# Patient Record
Sex: Male | Born: 1960 | Race: White | Hispanic: No | Marital: Married | Smoking: Former smoker
Health system: Southern US, Community
[De-identification: ages and names within clinical notes are randomized; demographics above are authoritative.]

## PROBLEM LIST (undated history)

## (undated) DIAGNOSIS — K635 Polyp of colon: Secondary | ICD-10-CM

## (undated) DIAGNOSIS — R931 Abnormal findings on diagnostic imaging of heart and coronary circulation: Secondary | ICD-10-CM

## (undated) DIAGNOSIS — T7840XA Allergy, unspecified, initial encounter: Secondary | ICD-10-CM

## (undated) DIAGNOSIS — I4891 Unspecified atrial fibrillation: Secondary | ICD-10-CM

## (undated) DIAGNOSIS — T148XXA Other injury of unspecified body region, initial encounter: Secondary | ICD-10-CM

## (undated) DIAGNOSIS — E119 Type 2 diabetes mellitus without complications: Secondary | ICD-10-CM

## (undated) DIAGNOSIS — E785 Hyperlipidemia, unspecified: Secondary | ICD-10-CM

## (undated) DIAGNOSIS — G709 Myoneural disorder, unspecified: Secondary | ICD-10-CM

## (undated) DIAGNOSIS — I10 Essential (primary) hypertension: Secondary | ICD-10-CM

## (undated) DIAGNOSIS — K648 Other hemorrhoids: Secondary | ICD-10-CM

## (undated) DIAGNOSIS — M109 Gout, unspecified: Secondary | ICD-10-CM

## (undated) DIAGNOSIS — K219 Gastro-esophageal reflux disease without esophagitis: Secondary | ICD-10-CM

## (undated) HISTORY — PX: BACK SURGERY: SHX140

## (undated) HISTORY — PX: POLYPECTOMY: SHX149

## (undated) HISTORY — PX: EXTERNAL EAR SURGERY: SHX627

## (undated) HISTORY — DX: Other injury of unspecified body region, initial encounter: T14.8XXA

## (undated) HISTORY — PX: LUMBAR DISC SURGERY: SHX700

## (undated) HISTORY — PX: KNEE ARTHROSCOPY: SUR90

## (undated) HISTORY — DX: Allergy, unspecified, initial encounter: T78.40XA

## (undated) HISTORY — DX: Essential (primary) hypertension: I10

## (undated) HISTORY — DX: Gout, unspecified: M10.9

## (undated) HISTORY — DX: Myoneural disorder, unspecified: G70.9

## (undated) HISTORY — DX: Gastro-esophageal reflux disease without esophagitis: K21.9

## (undated) HISTORY — DX: Unspecified atrial fibrillation: I48.91

## (undated) HISTORY — DX: Polyp of colon: K63.5

## (undated) HISTORY — DX: Type 2 diabetes mellitus without complications: E11.9

## (undated) HISTORY — DX: Abnormal findings on diagnostic imaging of heart and coronary circulation: R93.1

## (undated) HISTORY — DX: Other hemorrhoids: K64.8

## (undated) HISTORY — PX: COLONOSCOPY: SHX174

## (undated) HISTORY — DX: Hyperlipidemia, unspecified: E78.5

## (undated) HISTORY — PX: CARPAL TUNNEL RELEASE: SHX101

---
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
## 2000-01-08 ENCOUNTER — Encounter: Admission: RE | Admit: 2000-01-08 | Discharge: 2000-02-11 | Payer: Self-pay | Admitting: *Deleted

## 2002-06-27 ENCOUNTER — Emergency Department (HOSPITAL_COMMUNITY): Admission: EM | Admit: 2002-06-27 | Discharge: 2002-06-27 | Payer: Self-pay | Admitting: Emergency Medicine

## 2002-06-27 ENCOUNTER — Encounter: Payer: Self-pay | Admitting: Emergency Medicine

## 2003-12-07 ENCOUNTER — Ambulatory Visit (HOSPITAL_COMMUNITY): Admission: RE | Admit: 2003-12-07 | Discharge: 2003-12-07 | Payer: Self-pay | Admitting: Unknown Physician Specialty

## 2004-08-04 ENCOUNTER — Emergency Department (HOSPITAL_COMMUNITY): Admission: EM | Admit: 2004-08-04 | Discharge: 2004-08-04 | Payer: Self-pay | Admitting: Family Medicine

## 2006-10-27 ENCOUNTER — Ambulatory Visit (HOSPITAL_COMMUNITY): Admission: RE | Admit: 2006-10-27 | Discharge: 2006-10-27 | Payer: Self-pay | Admitting: Orthopedic Surgery

## 2007-08-19 HISTORY — PX: OTHER SURGICAL HISTORY: SHX169

## 2007-09-20 ENCOUNTER — Encounter: Admission: RE | Admit: 2007-09-20 | Discharge: 2007-09-20 | Payer: Self-pay | Admitting: Orthopaedic Surgery

## 2008-08-29 ENCOUNTER — Ambulatory Visit: Payer: Self-pay | Admitting: Cardiovascular Disease

## 2008-09-06 ENCOUNTER — Ambulatory Visit: Payer: Self-pay

## 2008-09-06 ENCOUNTER — Encounter: Payer: Self-pay | Admitting: Cardiovascular Disease

## 2010-04-12 ENCOUNTER — Emergency Department (HOSPITAL_COMMUNITY): Admission: EM | Admit: 2010-04-12 | Discharge: 2010-04-12 | Payer: Self-pay | Admitting: Emergency Medicine

## 2010-12-31 NOTE — Assessment & Plan Note (Signed)
[facility]                            CARDIOLOGY OFFICE NOTE   Donald, Butler                           MRN:          [id number]  DATE:08/29/2008                            DOB:          1960/10/24    Donald Butler is an unfortunate 50 year old gentleman who has been  incapacitated over the last year and a half by an injury at work.  He  has a dead left leg.  He has had multiple surgeries including three in  the last year by the chief of surgery at [facility].  He seems extremely  depressed about this.  He has been having some atypical chest pain.  The  pain has been in the center of his chest.  It is not always exertional  and it radiates up towards his shoulders and down his arm.  He has some  paresthesias in both hands and wrists.  This is associated occasionally  with exertional dyspnea.  He is not a smoker.  He quit in 1989.  He does  not have chronic lung disease.  Unfortunately in the setting of his leg  injury and multiple surgeries, he has gained about 35 pounds.  He has  had some lower extremity edema.  His blood pressure has been labile.  His primary care doctor has been adjusting these in regards to his  Benicar.   He has not had a good diet.  He eats too many carbohydrates which is  added to his weight gain.   The patient is fearful about his heart and wants to make sure that it is  okay and to trying to get on with his life.  Unfortunately, his overall  activity level is limited by a lame left leg.   He has not had any previous cardiac workup before.   REVIEW OF SYSTEMS:  Otherwise negative.   PAST MEDICAL HISTORY:  Remarkable for previous back surgery and multiple  left knee surgeries.   He currently has been unemployed for a year and a half.  I believe he is  getting disability through __________ [employer] where his injury  occurred.  He is married with two teenage children.  He does not smoke  or drink.  He is definitely  depressed.   REVIEW OF SYSTEMS:  Remarkable for fatigue, reflux, anxiety, depression,  hiatal hernia, and seasonal allergies.   FAMILY HISTORY:  Remarkable for premature coronary artery disease in  both mother and father.   CURRENT MEDICATIONS:  1. Benicar 40 a day.  2. Prilosec p.r.n.  3. Aspirin a day.   PHYSICAL EXAMINATION:  GENERAL:  Remarkable for an overweight male in no  distress.  VITAL SIGNS:  His blood pressure is 130/80, pulse 76 and regular,  respiratory rate 14, afebrile, weight 250.  HEENT:  Unremarkable.  NECK:  Carotids normal without bruit.  No lymphadenopathy, thyromegaly,  or JVP elevation.  LUNGS:  Clear.  Good diaphragmatic motion.  No wheezing.  CARDIAC:  S1, S2 with normal heart sounds.  PMI normal.  ABDOMEN:  Benign.  Bowel sounds positive.  No  AAA.  No tenderness.  No  bruit.  No hepatosplenomegaly or hepatojugular reflux.  No tenderness.  EXTREMITIES:  Distal pulses are intact.  No edema.  NEUROLOGIC:  Remarkable for decreased strength in the left leg with  multiple previous surgeries.   EKG is normal.   IMPRESSION:  1. Atypical chest pain.  Coronary risk factors include hypertension      and family history.  Followup adenosine Myoview.  2. Dyspnea with chest pain.  The patient will have a 2D echocardiogram      to assess right ventricular and left ventricular function.  I      suspect this is functional.  3. Significant depression.  Followup with primary care MD.  We will      start the patient on SSRI.  4. Hypertension, labile.  Continue Benicar.  Consider addition of a      low-dose diuretic.  5. History of reflux.  Continue Prilosec.   As long as the patient's cardiac tests are normal, he will be seen on an  as-needed basis.     Noralyn Pick. Eden Emms, MD, [facility]  Electronically Signed    PCN/MedQ  DD: 08/29/2008  DT: 08/30/2008  Job #: [id number]   cc:   Samuel Jester

## 2011-05-23 ENCOUNTER — Other Ambulatory Visit (HOSPITAL_COMMUNITY): Payer: Self-pay | Admitting: Otolaryngology

## 2011-05-23 DIAGNOSIS — H9211 Otorrhea, right ear: Secondary | ICD-10-CM

## 2011-05-27 ENCOUNTER — Ambulatory Visit (HOSPITAL_COMMUNITY)
Admission: RE | Admit: 2011-05-27 | Discharge: 2011-05-27 | Disposition: A | Discharge status: Home / Self Care | Payer: Medicare Other | Source: Ambulatory Visit | Attending: Otolaryngology | Admitting: Otolaryngology

## 2011-05-27 DIAGNOSIS — H9211 Otorrhea, right ear: Secondary | ICD-10-CM

## 2011-05-27 DIAGNOSIS — H7010 Chronic mastoiditis, unspecified ear: Secondary | ICD-10-CM | POA: Insufficient documentation

## 2011-05-27 DIAGNOSIS — H6690 Otitis media, unspecified, unspecified ear: Secondary | ICD-10-CM | POA: Insufficient documentation

## 2011-05-27 DIAGNOSIS — H902 Conductive hearing loss, unspecified: Secondary | ICD-10-CM | POA: Insufficient documentation

## 2011-08-19 HISTORY — PX: MASTOIDECTOMY: SHX711

## 2012-03-30 ENCOUNTER — Encounter: Payer: Self-pay | Admitting: Gastroenterology

## 2012-04-22 ENCOUNTER — Ambulatory Visit (AMBULATORY_SURGERY_CENTER): Payer: Medicare Other | Admitting: *Deleted

## 2012-04-22 VITALS — Ht 68.0 in | Wt 238.8 lb

## 2012-04-22 DIAGNOSIS — Z1211 Encounter for screening for malignant neoplasm of colon: Secondary | ICD-10-CM

## 2012-04-22 MED ORDER — MOVIPREP 100 G PO SOLR: ORAL | Status: DC

## 2012-05-06 ENCOUNTER — Encounter: Payer: Self-pay | Admitting: Gastroenterology

## 2012-05-06 ENCOUNTER — Ambulatory Visit (AMBULATORY_SURGERY_CENTER): Payer: Medicare Other | Admitting: Gastroenterology

## 2012-05-06 VITALS — BP 138/94 | HR 70 | Temp 98.0°F | Resp 16 | Ht 68.0 in | Wt 238.0 lb

## 2012-05-06 DIAGNOSIS — D126 Benign neoplasm of colon, unspecified: Secondary | ICD-10-CM

## 2012-05-06 DIAGNOSIS — Z1211 Encounter for screening for malignant neoplasm of colon: Secondary | ICD-10-CM

## 2012-05-06 MED ORDER — SODIUM CHLORIDE 0.9 % IV SOLN: 500.0000 mL | INTRAVENOUS | Status: DC

## 2012-05-06 NOTE — Progress Notes (Signed)
Patient did not experience any of the following events: a burn prior to discharge; a fall within the facility; wrong site/side/patient/procedure/implant event; or a hospital transfer or hospital admission upon discharge from the facility. (G8907) Patient did not have preoperative order for IV antibiotic SSI prophylaxis. (G8918)  

## 2012-05-06 NOTE — Op Note (Signed)
[facility] [address] [place], [address]   COLONOSCOPY PROCEDURE REPORT PATIENT: Donald Butler, Donald Butler  MR#: [id number] BIRTHDATE: 1961-07-08 , 51  yrs. old GENDER: Male ENDOSCOPIST: Meryl Dare, MD, [facility] REFERRED NF:AOZHY Yetta Barre, PA-C PROCEDURE DATE:  05/06/2012 PROCEDURE:   Colonoscopy with snare polypectomy ASA CLASS:   Class II INDICATIONS:average risk screening. MEDICATIONS: MAC sedation, administered by CRNA and propofol (Diprivan) 300mg  IV DESCRIPTION OF PROCEDURE:   After the risks benefits and alternatives of the procedure were thoroughly explained, informed consent was obtained.  A digital rectal exam revealed no abnormalities of the rectum.   The LB CF-[id number]  endoscope was introduced through the anus and advanced to the cecum, which was identified by both the appendix and ileocecal valve. No adverse events experienced.   The quality of the prep was adequate, using MoviPrep  The instrument was then slowly withdrawn as the colon was fully examined.   COLON FINDINGS: A sessile polyp measuring 7 mm in size was found in the transverse colon.  A polypectomy was performed with a cold snare.  The resection was complete and the polyp tissue was completely retrieved.   A sessile polyp measuring 5 mm in size was found in the sigmoid colon.  A polypectomy was performed with a cold snare.  The resection was complete and the polyp tissue was completely retrieved.   Moderate diverticulosis was noted in the sigmoid colon and descending colon.   The colon was otherwise normal.  There was no diverticulosis, inflammation, polyps or cancers unless previously stated.  Retroflexed views revealed moderate internal hemorrhoids. The time to cecum=0 minutes 48 seconds.  Withdrawal time=9 minutes 20 seconds.  The scope was withdrawn and the procedure completed. COMPLICATIONS: There were no complications.  ENDOSCOPIC IMPRESSION: 1.   Sessile polyp in the  transverse colon; polypectomy was performed with a cold snare 2.   Sessile polyp in the sigmoid colon; polypectomy was performed with a cold snare 3.   Moderate diverticulosis in the sigmoid colon and descending colon 4.   Moderate internal hemorrhoids  RECOMMENDATIONS: 1.  Await pathology results 2.  High fiber diet with liberal fluid intake. 3.  Repeat colonoscopy in 5 years if polyp(s) adenomatous; otherwise 10 years  eSigned:  Meryl Dare, MD, [facility] 05/06/2012 11:49 AM

## 2012-05-06 NOTE — Progress Notes (Signed)
No complaints noted in the recovery room. Maw   

## 2012-05-06 NOTE — Patient Instructions (Addendum)
Discharge instructions given with verbal understanding. Handouts on polyps,diverticulosis and hemorrhoids given. Resume previous medications. YOU HAD AN ENDOSCOPIC PROCEDURE TODAY AT THE [facility]: Refer to the procedure report that was given to you for any specific questions about what was found during the examination.  If the procedure report does not answer your questions, please call your gastroenterologist to clarify.  If you requested that your care partner not be given the details of your procedure findings, then the procedure report has been included in a sealed envelope for you to review at your convenience later.  YOU SHOULD EXPECT: Some feelings of bloating in the abdomen. Passage of more gas than usual.  Walking can help get rid of the air that was put into your GI tract during the procedure and reduce the bloating. If you had a lower endoscopy (such as a colonoscopy or flexible sigmoidoscopy) you may notice spotting of blood in your stool or on the toilet paper. If you underwent a bowel prep for your procedure, then you may not have a normal bowel movement for a few days.  DIET: Your first meal following the procedure should be a light meal and then it is ok to progress to your normal diet.  A half-sandwich or bowl of soup is an example of a good first meal.  Heavy or fried foods are harder to digest and may make you feel nauseous or bloated.  Likewise meals heavy in dairy and vegetables can cause extra gas to form and this can also increase the bloating.  Drink plenty of fluids but you should avoid alcoholic beverages for 24 hours.  ACTIVITY: Your care partner should take you home directly after the procedure.  You should plan to take it easy, moving slowly for the rest of the day.  You can resume normal activity the day after the procedure however you should NOT DRIVE or use heavy machinery for 24 hours (because of the sedation medicines used during the test).    SYMPTOMS TO  REPORT IMMEDIATELY: A gastroenterologist can be reached at any hour.  During normal business hours, 8:30 AM to 5:00 PM Monday through Friday, call [phone].  After hours and on weekends, please call the GI answering service at [phone] who will take a message and have the physician on call contact you.   Following lower endoscopy (colonoscopy or flexible sigmoidoscopy):  Excessive amounts of blood in the stool  Significant tenderness or worsening of abdominal pains  Swelling of the abdomen that is new, acute  Fever of 100F or higher  FOLLOW UP: If any biopsies were taken you will be contacted by phone or by letter within the next 1-3 weeks.  Call your gastroenterologist if you have not heard about the biopsies in 3 weeks.  Our staff will call the home number listed on your records the next business day following your procedure to check on you and address any questions or concerns that you may have at that time regarding the information given to you following your procedure. This is a courtesy call and so if there is no answer at the home number and we have not heard from you through the emergency physician on call, we will assume that you have returned to your regular daily activities without incident.  SIGNATURES/CONFIDENTIALITY: You and/or your care partner have signed paperwork which will be entered into your electronic medical record.  These signatures attest to the fact that that the information above on your After Visit   Summary has been reviewed and is understood.  Full responsibility of the confidentiality of this discharge information lies with you and/or your care-partner.  

## 2012-05-07 ENCOUNTER — Telehealth: Payer: Self-pay | Admitting: *Deleted

## 2012-05-07 NOTE — Telephone Encounter (Signed)
  Follow up Call-  Call back number 05/06/2012  Post procedure Call Back phone  # [phone]  Permission to leave phone message Yes     Patient questions:  Do you have a fever, pain , or abdominal swelling? no Pain Score  0 *  Have you tolerated food without any problems? yes  Have you been able to return to your normal activities? yes  Do you have any questions about your discharge instructions: Diet   no Medications  no Follow up visit  no  Do you have questions or concerns about your Care? no  Actions: * If pain score is 4 or above: No action needed, pain <4.

## 2012-05-11 ENCOUNTER — Encounter: Payer: Self-pay | Admitting: Gastroenterology

## 2014-11-15 DIAGNOSIS — G5602 Carpal tunnel syndrome, left upper limb: Secondary | ICD-10-CM | POA: Diagnosis not present

## 2014-11-15 DIAGNOSIS — G5601 Carpal tunnel syndrome, right upper limb: Secondary | ICD-10-CM | POA: Diagnosis not present

## 2014-11-29 DIAGNOSIS — R202 Paresthesia of skin: Secondary | ICD-10-CM | POA: Diagnosis not present

## 2014-11-30 DIAGNOSIS — G5601 Carpal tunnel syndrome, right upper limb: Secondary | ICD-10-CM | POA: Diagnosis not present

## 2014-11-30 DIAGNOSIS — G5602 Carpal tunnel syndrome, left upper limb: Secondary | ICD-10-CM | POA: Diagnosis not present

## 2014-11-30 DIAGNOSIS — R202 Paresthesia of skin: Secondary | ICD-10-CM | POA: Diagnosis not present

## 2014-12-07 DIAGNOSIS — G5602 Carpal tunnel syndrome, left upper limb: Secondary | ICD-10-CM | POA: Diagnosis not present

## 2014-12-11 DIAGNOSIS — G5602 Carpal tunnel syndrome, left upper limb: Secondary | ICD-10-CM | POA: Diagnosis not present

## 2014-12-20 DIAGNOSIS — M25511 Pain in right shoulder: Secondary | ICD-10-CM | POA: Diagnosis not present

## 2015-01-05 DIAGNOSIS — Z79899 Other long term (current) drug therapy: Secondary | ICD-10-CM | POA: Diagnosis not present

## 2015-01-05 DIAGNOSIS — R0682 Tachypnea, not elsewhere classified: Secondary | ICD-10-CM | POA: Diagnosis not present

## 2015-01-05 DIAGNOSIS — R05 Cough: Secondary | ICD-10-CM | POA: Diagnosis not present

## 2015-01-05 DIAGNOSIS — R0602 Shortness of breath: Secondary | ICD-10-CM | POA: Diagnosis not present

## 2015-01-05 DIAGNOSIS — Z7982 Long term (current) use of aspirin: Secondary | ICD-10-CM | POA: Diagnosis not present

## 2015-01-05 DIAGNOSIS — Z87891 Personal history of nicotine dependence: Secondary | ICD-10-CM | POA: Diagnosis not present

## 2015-01-05 DIAGNOSIS — I10 Essential (primary) hypertension: Secondary | ICD-10-CM | POA: Diagnosis not present

## 2015-01-05 DIAGNOSIS — J069 Acute upper respiratory infection, unspecified: Secondary | ICD-10-CM | POA: Diagnosis not present

## 2015-01-05 DIAGNOSIS — R0689 Other abnormalities of breathing: Secondary | ICD-10-CM | POA: Diagnosis not present

## 2015-01-10 DIAGNOSIS — G5600 Carpal tunnel syndrome, unspecified upper limb: Secondary | ICD-10-CM | POA: Diagnosis not present

## 2015-04-30 DIAGNOSIS — E785 Hyperlipidemia, unspecified: Secondary | ICD-10-CM | POA: Diagnosis not present

## 2015-04-30 DIAGNOSIS — R5383 Other fatigue: Secondary | ICD-10-CM | POA: Diagnosis not present

## 2015-04-30 DIAGNOSIS — Z0000 Encounter for general adult medical examination without abnormal findings: Secondary | ICD-10-CM | POA: Diagnosis not present

## 2015-04-30 DIAGNOSIS — G629 Polyneuropathy, unspecified: Secondary | ICD-10-CM | POA: Diagnosis not present

## 2015-04-30 DIAGNOSIS — G5732 Lesion of lateral popliteal nerve, left lower limb: Secondary | ICD-10-CM | POA: Diagnosis not present

## 2015-04-30 DIAGNOSIS — Z125 Encounter for screening for malignant neoplasm of prostate: Secondary | ICD-10-CM | POA: Diagnosis not present

## 2015-04-30 DIAGNOSIS — L570 Actinic keratosis: Secondary | ICD-10-CM | POA: Diagnosis not present

## 2015-05-10 DIAGNOSIS — L570 Actinic keratosis: Secondary | ICD-10-CM | POA: Diagnosis not present

## 2015-05-10 DIAGNOSIS — D0421 Carcinoma in situ of skin of right ear and external auricular canal: Secondary | ICD-10-CM | POA: Diagnosis not present

## 2015-05-10 DIAGNOSIS — X32XXXA Exposure to sunlight, initial encounter: Secondary | ICD-10-CM | POA: Diagnosis not present

## 2015-07-23 DIAGNOSIS — L570 Actinic keratosis: Secondary | ICD-10-CM | POA: Diagnosis not present

## 2015-07-23 DIAGNOSIS — D0462 Carcinoma in situ of skin of left upper limb, including shoulder: Secondary | ICD-10-CM | POA: Diagnosis not present

## 2015-07-23 DIAGNOSIS — Z85828 Personal history of other malignant neoplasm of skin: Secondary | ICD-10-CM | POA: Diagnosis not present

## 2015-07-23 DIAGNOSIS — X32XXXD Exposure to sunlight, subsequent encounter: Secondary | ICD-10-CM | POA: Diagnosis not present

## 2015-07-23 DIAGNOSIS — Z08 Encounter for follow-up examination after completed treatment for malignant neoplasm: Secondary | ICD-10-CM | POA: Diagnosis not present

## 2016-05-26 ENCOUNTER — Telehealth: Payer: Self-pay

## 2016-05-26 NOTE — Telephone Encounter (Signed)
He wants to know if he can pay cash to see Donald Butler. Please call patient.

## 2016-05-27 ENCOUNTER — Ambulatory Visit: Payer: Self-pay | Admitting: Physician Assistant

## 2016-05-29 ENCOUNTER — Encounter: Payer: Self-pay | Admitting: Family Medicine

## 2016-05-29 ENCOUNTER — Ambulatory Visit (INDEPENDENT_AMBULATORY_CARE_PROVIDER_SITE_OTHER): Payer: Medicare Other | Admitting: Family Medicine

## 2016-05-29 DIAGNOSIS — M109 Gout, unspecified: Secondary | ICD-10-CM | POA: Diagnosis not present

## 2016-05-29 DIAGNOSIS — K219 Gastro-esophageal reflux disease without esophagitis: Secondary | ICD-10-CM | POA: Diagnosis not present

## 2016-05-29 DIAGNOSIS — G5702 Lesion of sciatic nerve, left lower limb: Secondary | ICD-10-CM | POA: Diagnosis not present

## 2016-05-29 DIAGNOSIS — I10 Essential (primary) hypertension: Secondary | ICD-10-CM | POA: Diagnosis not present

## 2016-05-29 DIAGNOSIS — E785 Hyperlipidemia, unspecified: Secondary | ICD-10-CM | POA: Diagnosis not present

## 2016-05-29 DIAGNOSIS — E1169 Type 2 diabetes mellitus with other specified complication: Secondary | ICD-10-CM | POA: Insufficient documentation

## 2016-05-29 MED ORDER — HYDROCODONE-ACETAMINOPHEN 10-325 MG PO TABS: 1.0000 | ORAL_TABLET | Freq: Three times a day (TID) | ORAL | 0 refills | Status: DC | PRN

## 2016-05-29 NOTE — Progress Notes (Signed)
   HPI  Patient presents today to establish care.  Patient's previous PCP has moved to our clinic.  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 He gets by most days without any pain medications but would like a refill hydrocodone if possible today.  He uses 10 mg once a day as needed for severe pain. He states that on most days he does not need it. It's been at least 6 months since his last refill.   GERD- helped by Prilosec  Hypertension Treated with  Lisinopril denies any chest pain, dyspnea palpitations, or leg edema..  hyperlipidemia Previously told that he had high cholesterol, states that he does not even in the same shape he was at that time. Would like blood work today, has had orange juice today but otherwise fasting   PMH: Smoking status noted Medical history of GERD, gout, hypertension Past surgeries including back surgery 2, carpal tunnel release, shoulder reconstruction, L leg surgery (multiple)  Roughly 3 drinks a day of alcohol, no drug use, does not smoke but does chew tobacco ROS: Per HPI  Objective: BP (!) 152/90   Pulse 72   Temp 97.7 F (36.5 C) (Oral)   Ht _0  (1.727 m)   Wt 225 lb 6.4 oz (102.2 kg)   BMI 34.27 kg/m  Gen: NAD, alert, cooperative with exam HEENT: NCAT CV: RRR, good S1/S2, no murmur Resp: CTABL, no wheezes, non-labored Abdomen: + bowel sounds Ext: No edema, warm Neuro: Alert and oriented, decreased strength in LLE 3/5 compared to 5/5 in RLE  Assessment and plan:  # chronic neuralgia, 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 Lipid panel  . CMP14+EGFR  . CBC with Differential  . TSH  . Uric acid    Meds ordered this encounter  Medications  . HYDROcodone-acetaminophen (NORCO) 10-325 MG tablet    Sig: Take 1 tablet by mouth every 8 (eight) hours as needed.    Dispense:  30 tablet    Refill:  0    Donald Apple, MD [facility] 05/29/2016, 2:11 PM

## 2016-05-29 NOTE — Patient Instructions (Signed)
Great to meet you!  lets have you come back to see Glenard Haring in 6 weeks for repeat labs (assuming you will need a statin)

## 2016-05-30 LAB — CMP14+EGFR
ALBUMIN: 4.8 g/dL (ref 3.5–5.5)
ALK PHOS: 62 IU/L (ref 39–117)
ALT: 20 IU/L (ref 0–44)
AST: 23 IU/L (ref 0–40)
Albumin/Globulin Ratio: 2.1 (ref 1.2–2.2)
BUN/Creatinine Ratio: 26 — ABNORMAL HIGH (ref 9–20)
BUN: 22 mg/dL (ref 6–24)
Bilirubin Total: 0.5 mg/dL (ref 0.0–1.2)
CALCIUM: 9.6 mg/dL (ref 8.7–10.2)
CO2: 26 mmol/L (ref 18–29)
CREATININE: 0.85 mg/dL (ref 0.76–1.27)
Chloride: 97 mmol/L (ref 96–106)
GFR calc Af Amer: 113 mL/min/{1.73_m2} (ref >59)
GFR, EST NON AFRICAN AMERICAN: 97 mL/min/{1.73_m2} (ref >59)
GLOBULIN, TOTAL: 2.3 g/dL (ref 1.5–4.5)
GLUCOSE: 99 mg/dL (ref 65–99)
Potassium: 4.8 mmol/L (ref 3.5–5.2)
SODIUM: 137 mmol/L (ref 134–144)
Total Protein: 7.1 g/dL (ref 6.0–8.5)

## 2016-05-30 LAB — LIPID PANEL
CHOLESTEROL TOTAL: 223 mg/dL — AB (ref 100–199)
Chol/HDL Ratio: 3.8 ratio units (ref 0.0–5.0)
HDL: 59 mg/dL (ref >39)
LDL CALC: 139 mg/dL — AB (ref 0–99)
Triglycerides: 127 mg/dL (ref 0–149)
VLDL Cholesterol Cal: 25 mg/dL (ref 5–40)

## 2016-05-30 LAB — CBC WITH DIFFERENTIAL/PLATELET
Basophils Absolute: 0 10*3/uL (ref 0.0–0.2)
Basos: 1 %
EOS (ABSOLUTE): 0.2 10*3/uL (ref 0.0–0.4)
EOS: 3 %
HEMATOCRIT: 44.4 % (ref 37.5–51.0)
HEMOGLOBIN: 15.3 g/dL (ref 12.6–17.7)
IMMATURE GRANS (ABS): 0 10*3/uL (ref 0.0–0.1)
IMMATURE GRANULOCYTES: 0 %
LYMPHS ABS: 2.1 10*3/uL (ref 0.7–3.1)
Lymphs: 31 %
MCH: 33 pg (ref 26.6–33.0)
MCHC: 34.5 g/dL (ref 31.5–35.7)
MCV: 96 fL (ref 79–97)
MONOCYTES: 10 %
Monocytes Absolute: 0.7 10*3/uL (ref 0.1–0.9)
Neutrophils Absolute: 3.7 10*3/uL (ref 1.4–7.0)
Neutrophils: 55 %
Platelets: 228 10*3/uL (ref 150–379)
RBC: 4.64 x10E6/uL (ref 4.14–5.80)
RDW: 13.1 % (ref 12.3–15.4)
WBC: 6.8 10*3/uL (ref 3.4–10.8)

## 2016-05-30 LAB — TSH: TSH: 1.84 u[IU]/mL (ref 0.450–4.500)

## 2016-05-31 ENCOUNTER — Other Ambulatory Visit: Payer: Self-pay | Admitting: Family Medicine

## 2016-05-31 DIAGNOSIS — E785 Hyperlipidemia, unspecified: Secondary | ICD-10-CM

## 2016-05-31 MED ORDER — ATORVASTATIN CALCIUM 20 MG PO TABS: 20.0000 mg | ORAL_TABLET | Freq: Every day | ORAL | 3 refills | Status: DC

## 2016-06-10 ENCOUNTER — Other Ambulatory Visit: Payer: Self-pay | Admitting: Family Medicine

## 2016-06-10 MED ORDER — LOVASTATIN 20 MG PO TABS: 20.0000 mg | ORAL_TABLET | Freq: Every day | ORAL | 3 refills | Status: DC

## 2016-06-10 NOTE — Telephone Encounter (Signed)
Changed to lovastatin per request for 4$ list med.   Laroy Apple, MD [facility] 06/10/2016, 7:02 PM

## 2016-06-11 ENCOUNTER — Telehealth: Payer: Self-pay | Admitting: Family Medicine

## 2016-06-11 NOTE — Telephone Encounter (Signed)
Called in,pt aware 

## 2016-06-11 NOTE — Telephone Encounter (Signed)
Pt aware of change in medication 

## 2016-07-30 ENCOUNTER — Telehealth: Payer: Self-pay

## 2016-07-30 MED ORDER — LISINOPRIL 40 MG PO TABS: 40.0000 mg | ORAL_TABLET | Freq: Every day | ORAL | 0 refills | Status: DC

## 2016-07-30 NOTE — Addendum Note (Signed)
Addended by: Nigel Berthold C on: 07/30/2016 04:45 PM   Modules accepted: Orders

## 2016-07-30 NOTE — Telephone Encounter (Signed)
yes

## 2016-07-30 NOTE — Telephone Encounter (Signed)
[facility] requested refill on patients lisinopril 40mg . Is this okay to fill? Please advise and send back to pools.

## 2016-07-30 NOTE — Telephone Encounter (Signed)
Rx sent 

## 2016-11-01 DIAGNOSIS — H1013 Acute atopic conjunctivitis, bilateral: Secondary | ICD-10-CM | POA: Diagnosis not present

## 2016-11-01 DIAGNOSIS — H40033 Anatomical narrow angle, bilateral: Secondary | ICD-10-CM | POA: Diagnosis not present

## 2016-11-10 ENCOUNTER — Other Ambulatory Visit: Payer: Self-pay | Admitting: Physician Assistant

## 2016-12-19 ENCOUNTER — Other Ambulatory Visit: Payer: Self-pay | Admitting: Family Medicine

## 2017-01-27 ENCOUNTER — Other Ambulatory Visit: Payer: Self-pay | Admitting: Family Medicine

## 2017-01-27 ENCOUNTER — Telehealth: Payer: Self-pay | Admitting: Family Medicine

## 2017-01-27 NOTE — Telephone Encounter (Signed)
Contacted patient to let him know that he would need to be seen for continuous refills on medication.  Patient was very rude and stated he did not know why he would need to come in every month to have a refill by Particia Nearing, PA.  Informed patient that he seen Dr. Wendi Snipes in October of 2017 and would need to see Particia Nearing, PA at this office for follow up and refills.  Patient was continuously rude.  Informed patient that I would send a message over to Particia Nearing, PA for continuous refills on BP medication.

## 2017-01-28 MED ORDER — LISINOPRIL 40 MG PO TABS: 40.0000 mg | ORAL_TABLET | Freq: Every day | ORAL | 3 refills | Status: DC

## 2017-01-28 NOTE — Addendum Note (Signed)
Addended by: Terald Sleeper on: 01/28/2017 02:21 PM   Modules accepted: Orders

## 2017-02-03 ENCOUNTER — Other Ambulatory Visit: Payer: Self-pay | Admitting: Physician Assistant

## 2017-02-03 MED ORDER — AZITHROMYCIN 250 MG PO TABS: ORAL_TABLET | ORAL | 0 refills | Status: DC

## 2017-02-03 MED ORDER — HYDROCODONE-HOMATROPINE 5-1.5 MG/5ML PO SYRP: 5.0000 mL | ORAL_SOLUTION | Freq: Four times a day (QID) | ORAL | 0 refills | Status: DC | PRN

## 2017-03-03 ENCOUNTER — Ambulatory Visit (INDEPENDENT_AMBULATORY_CARE_PROVIDER_SITE_OTHER): Payer: Medicare Other | Admitting: Family Medicine

## 2017-03-03 ENCOUNTER — Encounter: Payer: Self-pay | Admitting: Family Medicine

## 2017-03-03 VITALS — BP 139/85 | HR 74 | Temp 97.5°F | Ht 68.0 in | Wt 238.0 lb

## 2017-03-03 DIAGNOSIS — N410 Acute prostatitis: Secondary | ICD-10-CM

## 2017-03-03 DIAGNOSIS — R1032 Left lower quadrant pain: Secondary | ICD-10-CM | POA: Diagnosis not present

## 2017-03-03 MED ORDER — CIPROFLOXACIN HCL 500 MG PO TABS: 500.0000 mg | ORAL_TABLET | Freq: Two times a day (BID) | ORAL | 0 refills | Status: DC

## 2017-03-03 MED ORDER — METRONIDAZOLE 500 MG PO TABS: 500.0000 mg | ORAL_TABLET | Freq: Two times a day (BID) | ORAL | 0 refills | Status: DC

## 2017-03-03 NOTE — Progress Notes (Signed)
Subjective:  Patient ID: Donald Butler, male    DOB: 11-15-1960  Age: 56 y.o. MRN: [id number]  CC: Abdominal Pain (pt here today c/o LLQ pain that comes and goes and over the weekend he had vomiting and diarrhea but that has subsided. He is due for his colonoscopy for 5 yer follow up in September of this year due to diverticulosis.)   HPI Donald Butler presents for 4 days of increasing left lower quadrant suprapubic pain. It started on the evening of 02/27/2017. He just felt bad and went to bed early. During that night he woke up with profuse vomiting. The next day started having profuse diarrhea as well. The vomiting stopped by the following day. However he has been having the diarrhea doesn't times or more daily. As of noon today he had just for loose watery bowel movements. He also has a pain in the left suprapubic area that's radiating down into the left testicle. It is intermittent and severe lasting just a few moments at a time.  Depression screen [facility] 2/9 03/03/2017 05/29/2016  Decreased Interest 0 0  Down, Depressed, Hopeless 0 0  PHQ - 2 Score 0 0    History Nolon has a past medical history of Allergy; GERD (gastroesophageal reflux disease); Gout; Hypertension; and Nerve damage.   He has a past surgical history that includes Back surgery (1982, 1983); Open anterior shoulder reconstruction (1970); Knee arthroscopy (2006, 2008); reconstruction left leg (2009); Mastoidectomy (2013); and Carpal tunnel release (Left).   His family history includes Diabetes in his father.He reports that he quit smoking about 28 years ago. His smokeless tobacco use includes Chew. He reports that he drinks about 10.5 oz of alcohol per week . He reports that he does not use drugs.    ROS Review of Systems  Constitutional: Negative for chills, diaphoresis, fever and unexpected weight change.  HENT: Negative for congestion, hearing loss, rhinorrhea and sore throat.   Eyes: Negative for visual  disturbance.  Respiratory: Negative for cough and shortness of breath.   Cardiovascular: Negative for chest pain.  Gastrointestinal: Positive for abdominal pain and diarrhea. Negative for constipation.  Genitourinary: Negative for dysuria and flank pain.  Musculoskeletal: Negative for arthralgias and joint swelling.  Skin: Negative for rash.  Neurological: Negative for dizziness and headaches.  Psychiatric/Behavioral: Negative for dysphoric mood and sleep disturbance.    Objective:  BP 139/85   Pulse 74   Temp (!) 97.5 F (36.4 C) (Oral)   Ht 5\' 8"  (1.727 m)   Wt 238 lb (108 kg)   BMI 36.19 kg/m   BP Readings from Last 3 Encounters:  03/03/17 139/85  05/29/16 (!) 152/90  05/06/12 (!) 138/94    Wt Readings from Last 3 Encounters:  03/03/17 238 lb (108 kg)  05/29/16 225 lb 6.4 oz (102.2 kg)  05/06/12 238 lb (108 kg)     Physical Exam  Constitutional: He is oriented to person, place, and time. He appears well-developed and well-nourished. No distress.  HENT:  Head: Normocephalic and atraumatic.  Right Ear: External ear normal.  Left Ear: External ear normal.  Nose: Nose normal.  Mouth/Throat: Oropharynx is clear and moist.  Eyes: Pupils are equal, round, and reactive to light. Conjunctivae and EOM are normal.  Neck: Normal range of motion. Neck supple. No thyromegaly present.  Cardiovascular: Normal rate, regular rhythm and normal heart sounds.   No murmur heard. Pulmonary/Chest: Effort normal and breath sounds normal. No respiratory distress. He has no wheezes.  He has no rales.  Abdominal: Soft. Bowel sounds are normal. He exhibits no distension. There is tenderness (LLQ).  Lymphadenopathy:    He has no cervical adenopathy.  Neurological: He is alert and oriented to person, place, and time. He has normal reflexes.  Skin: Skin is warm and dry.  Psychiatric: He has a normal mood and affect. His behavior is normal. Judgment and thought content normal.       Assessment & Plan:   Braddock was seen today for abdominal pain.  Diagnoses and all orders for this visit:  Acute prostatitis  Left lower quadrant pain  Other orders -     metroNIDAZOLE (FLAGYL) 500 MG tablet; Take 1 tablet (500 mg total) by mouth 2 (two) times daily. -     ciprofloxacin (CIPRO) 500 MG tablet; Take 1 tablet (500 mg total) by mouth 2 (two) times daily. For prostate. Take all of these.       I have discontinued Mr. Chong's azithromycin. I am also having him start on metroNIDAZOLE and ciprofloxacin. Additionally, I am having him maintain his multivitamin, aspirin, Cyanocobalamin (VITAMIN B-12 CR PO), omeprazole, loratadine, allopurinol, HYDROcodone-acetaminophen, lovastatin, lisinopril, and HYDROcodone-homatropine.  Allergies as of 03/03/2017      Reactions   Penicillins Hives, Shortness Of Breath, Swelling      Medication List       Accurate as of 03/03/17 11:59 PM. Always use your most recent med list.          allopurinol 300 MG tablet Commonly known as:  ZYLOPRIM Take 300 mg by mouth daily.   aspirin 325 MG tablet Take 325 mg by mouth daily.   ciprofloxacin 500 MG tablet Commonly known as:  CIPRO Take 1 tablet (500 mg total) by mouth 2 (two) times daily. For prostate. Take all of these.   HYDROcodone-acetaminophen 10-325 MG tablet Commonly known as:  NORCO Take 1 tablet by mouth every 8 (eight) hours as needed.   HYDROcodone-homatropine 5-1.5 MG/5ML syrup Commonly known as:  HYCODAN Take 5-10 mLs by mouth every 6 (six) hours as needed.   lisinopril 40 MG tablet Commonly known as:  PRINIVIL,ZESTRIL Take 1 tablet (40 mg total) by mouth daily.   loratadine 10 MG tablet Commonly known as:  CLARITIN Take 10 mg by mouth daily.   lovastatin 20 MG tablet Commonly known as:  MEVACOR Take 1 tablet (20 mg total) by mouth at bedtime.   metroNIDAZOLE 500 MG tablet Commonly known as:  FLAGYL Take 1 tablet (500 mg total) by mouth 2 (two)  times daily.   multivitamin tablet Take 1 tablet by mouth daily.   omeprazole 20 MG capsule Commonly known as:  PRILOSEC Take 20 mg by mouth daily.   VITAMIN B-12 CR PO Take 500 mg by mouth. Takes 2 tablets daily        Follow-up: Return in about 2 weeks (around 03/17/2017).  Claretta Fraise, M.D.

## 2017-03-25 DIAGNOSIS — F17290 Nicotine dependence, other tobacco product, uncomplicated: Secondary | ICD-10-CM | POA: Diagnosis not present

## 2017-03-25 DIAGNOSIS — H6983 Other specified disorders of Eustachian tube, bilateral: Secondary | ICD-10-CM | POA: Diagnosis not present

## 2017-03-25 DIAGNOSIS — Z9622 Myringotomy tube(s) status: Secondary | ICD-10-CM | POA: Diagnosis not present

## 2017-03-25 DIAGNOSIS — H6122 Impacted cerumen, left ear: Secondary | ICD-10-CM | POA: Diagnosis not present

## 2017-03-25 DIAGNOSIS — H9211 Otorrhea, right ear: Secondary | ICD-10-CM | POA: Diagnosis not present

## 2017-04-09 DIAGNOSIS — F17290 Nicotine dependence, other tobacco product, uncomplicated: Secondary | ICD-10-CM | POA: Diagnosis not present

## 2017-04-09 DIAGNOSIS — H6983 Other specified disorders of Eustachian tube, bilateral: Secondary | ICD-10-CM | POA: Diagnosis not present

## 2017-04-28 ENCOUNTER — Other Ambulatory Visit: Payer: Self-pay | Admitting: Physician Assistant

## 2017-05-27 ENCOUNTER — Telehealth: Payer: Self-pay | Admitting: Family Medicine

## 2017-05-27 DIAGNOSIS — Z1211 Encounter for screening for malignant neoplasm of colon: Secondary | ICD-10-CM

## 2017-05-27 NOTE — Telephone Encounter (Signed)
Please address

## 2017-05-27 NOTE — Telephone Encounter (Signed)
What type of referral do you need? Pt is due for a colonoscopy  Have you been seen at our office for this problem? no (If no, schedule them an appointment.  They will need to be seen before a referral can be done.)  Is there a particular doctor or location that you prefer? [facility], is where he had it last.  Patient notified that referrals can take up to a week or longer to process. If they haven't heard anything within a week they should call back and speak with the referral department.

## 2017-05-28 NOTE — Telephone Encounter (Signed)
Patient aware referral placed.

## 2017-06-02 ENCOUNTER — Encounter: Payer: Self-pay | Admitting: Gastroenterology

## 2017-06-09 ENCOUNTER — Encounter (INDEPENDENT_AMBULATORY_CARE_PROVIDER_SITE_OTHER): Payer: Self-pay

## 2017-06-09 ENCOUNTER — Ambulatory Visit (INDEPENDENT_AMBULATORY_CARE_PROVIDER_SITE_OTHER): Payer: Medicare Other | Admitting: Nurse Practitioner

## 2017-06-09 ENCOUNTER — Encounter: Payer: Self-pay | Admitting: Nurse Practitioner

## 2017-06-09 VITALS — BP 144/86 | HR 76 | Ht 68.0 in | Wt 240.1 lb

## 2017-06-09 DIAGNOSIS — R110 Nausea: Secondary | ICD-10-CM | POA: Diagnosis not present

## 2017-06-09 DIAGNOSIS — Z86010 Personal history of colonic polyps: Secondary | ICD-10-CM

## 2017-06-09 DIAGNOSIS — K625 Hemorrhage of anus and rectum: Secondary | ICD-10-CM

## 2017-06-09 DIAGNOSIS — K649 Unspecified hemorrhoids: Secondary | ICD-10-CM | POA: Diagnosis not present

## 2017-06-09 DIAGNOSIS — K219 Gastro-esophageal reflux disease without esophagitis: Secondary | ICD-10-CM | POA: Diagnosis not present

## 2017-06-09 DIAGNOSIS — R140 Abdominal distension (gaseous): Secondary | ICD-10-CM | POA: Diagnosis not present

## 2017-06-09 MED ORDER — HYDROCORTISONE 2.5 % RE CREA: 1.0000 "application " | TOPICAL_CREAM | Freq: Every day | RECTAL | 1 refills | Status: DC

## 2017-06-09 MED ORDER — NA SULFATE-K SULFATE-MG SULF 17.5-3.13-1.6 GM/177ML PO SOLN
ORAL | 0 refills | Status: DC
Start: 2017-06-09 — End: 2017-06-22

## 2017-06-09 NOTE — Patient Instructions (Addendum)
If you are age 57 or older, your body mass index should be between 23-30. Your Body mass index is 36.51 kg/m. If this is out of the aforementioned range listed, please consider follow up with your Primary Care Provider.  If you are age 44 or younger, your body mass index should be between 19-25. Your Body mass index is 36.51 kg/m. If this is out of the aformentioned range listed, please consider follow up with your Primary Care Provider.   You have been scheduled for a colonoscopy. Please follow written instructions given to you at your visit today.  Please pick up your prep supplies at the pharmacy within the next 1-3 days. If you use inhalers (even only as needed), please bring them with you on the day of your procedure. Your physician has requested that you go to www.startemmi.com and enter the access code given to you at your visit today. This web site gives a general overview about your procedure. However, you should still follow specific instructions given to you by our office regarding your preparation for the procedure.  We have sent the following medications to your pharmacy for you to pick up at your convenience: Anusol Cream Suprep   You have been given a Low Gast Diet.  Thank you for choosing me and [facility].   Tye Savoy, NP

## 2017-06-09 NOTE — Progress Notes (Signed)
HPI: Patient is a 56 yo known to Dr.Stark. He has a hx of adenomatous colon polyps and is due for surveillance colonoscopy. He is referred by PCP for multiple GI complaints including bloating, frequent nausea, loose stools, and hemorrhoidal bleeding. Saw PCP in July with LLQ pain, vomiting and diarrhea . Patient says he was given antibiotics for diverticulitis but I reviewed PCPs note and was treated for acute prostatitis. At any rate, the abdominal pain resolved with antibiotics. Patient denies diarrhea but he does require several bowel movements in the morning to feel fully evacuated.  He complains of bloating and frequent nausea.  His  appetite is decreased but has not lost any weight. .He has been having rectal bleeding attributed to hemorrhoids. Patient does not want a rectal exam today.Marland Kitchen He has not tried anything for his hemorrhoids.      Past Medical History:  Diagnosis Date  . Allergy   . GERD (gastroesophageal reflux disease)   . Gout   . Hypertension   . Nerve damage    left leg     Past Surgical History:  Procedure Laterality Date  . [place]   ruptured disk  x 2  . CARPAL TUNNEL RELEASE Left   . KNEE ARTHROSCOPY  2006, 2008   x 2  left  . MASTOIDECTOMY  2013   right  . OPEN ANTERIOR SHOULDER RECONSTRUCTION  1970   right  . reconstruction left leg  2009   multiple surgery after accident   Family History  Problem Relation Age of Onset  . Diabetes Father   . Colon cancer Neg Hx   . Stomach cancer Neg Hx   . Esophageal cancer Neg Hx   . Rectal cancer Neg Hx    Social History  Substance Use Topics  . Smoking status: Former Smoker    Quit date: 04/22/1988  . Smokeless tobacco: Current User    Types: Chew  . Alcohol use 10.5 oz/week    21 Standard drinks or equivalent per week   Current Outpatient Prescriptions  Medication Sig Dispense Refill  . allopurinol (ZYLOPRIM) 300 MG tablet Take 300 mg by mouth daily.    Marland Kitchen aspirin 325 MG tablet  Take 325 mg by mouth daily.    . Cyanocobalamin (VITAMIN B-12 PO) Take 5,000 mcg by mouth daily.    Marland Kitchen HYDROcodone-acetaminophen (NORCO) 10-325 MG tablet Take 1 tablet by mouth every 8 (eight) hours as needed. 30 tablet 0  . lisinopril (PRINIVIL,ZESTRIL) 40 MG tablet TAKE 1 TABLET BY MOUTH ONCE DAILY 90 tablet 0  . loratadine (CLARITIN) 10 MG tablet Take 10 mg by mouth daily.    Marland Kitchen lovastatin (MEVACOR) 20 MG tablet Take 1 tablet (20 mg total) by mouth at bedtime. 90 tablet 3  . Multiple Vitamin (MULTIVITAMIN) tablet Take 1 tablet by mouth daily.    Marland Kitchen omeprazole (PRILOSEC) 20 MG capsule Take 20 mg by mouth daily.     No current facility-administered medications for this visit.    Allergies  Allergen Reactions  . Penicillins Hives, Shortness Of Breath and Swelling     Review of Systems: All systems reviewed and negative except where noted in HPI.    Physical Exam: BP (!) 144/86   Pulse 76   Ht 5\' 8"  (1.727 m)   Wt 240 lb 2 oz (108.9 kg)   BMI 36.51 kg/m  Constitutional:  Overweight white male in no acute distress. Psychiatric: Normal mood and affect. Behavior is  normal. EENT: Pupils normal.  Conjunctivae are normal. No scleral icterus. Neck supple.  Cardiovascular: Normal rate, regular rhythm. No edema Pulmonary/chest: Effort normal and breath sounds normal. No wheezing, rales or rhonchi. Abdominal: Soft, obese, nontender. Bowel sounds active throughout. There are no masses palpable. No hepatomegaly. Lymphadenopathy: No cervical adenopathy noted. Neurological: Alert and oriented to person place and time. Skin: Skin is warm and dry. No rashes noted.   ASSESSMENT AND PLAN:  20. 56 year old male with history of adenomatous colon polyps, due for surveillance colonooscopy.  -Patient will be scheduled for a colonoscopy with possible polypectomy.  The risks and benefits of the procedure were discussed and the patient agrees to proceed.   2. GERD. Asymptomatic as long as he takes  Prilosec OTC every day  3. Bloating / belching / flatulence / nausea. No weight loss.  -Explained to patient that bloating and gas are almost always benign though still a nuisance.  -He can try Gas-X. He is eating yogurt which helps. Discussed gas producing foods to avoid. Low gas diet given  4. Rectal bleeding. PCP recently did DRE and found hemorrhoids. Patient does not want a repeat rectal exam today he is due for colonoscpy anyway so rectal exam was deferred -Anusol HC daily at bedtime internally and externally for 7 days  Tye Savoy, NP  06/09/2017, 8:50 AM  Cc: Timmothy Euler, MD

## 2017-06-15 NOTE — Progress Notes (Signed)
Reviewed and agree with management plan.  Toluwani Yadav T. Aleaha Fickling, MD FACG 

## 2017-06-22 ENCOUNTER — Encounter: Payer: Self-pay | Admitting: Gastroenterology

## 2017-06-22 ENCOUNTER — Ambulatory Visit (AMBULATORY_SURGERY_CENTER): Payer: Medicare Other | Admitting: Gastroenterology

## 2017-06-22 VITALS — BP 134/88 | HR 74 | Temp 97.8°F | Resp 13 | Ht 68.0 in | Wt 240.0 lb

## 2017-06-22 DIAGNOSIS — K635 Polyp of colon: Secondary | ICD-10-CM

## 2017-06-22 DIAGNOSIS — D123 Benign neoplasm of transverse colon: Secondary | ICD-10-CM

## 2017-06-22 DIAGNOSIS — Z86010 Personal history of colonic polyps: Secondary | ICD-10-CM

## 2017-06-22 DIAGNOSIS — K219 Gastro-esophageal reflux disease without esophagitis: Secondary | ICD-10-CM | POA: Diagnosis not present

## 2017-06-22 DIAGNOSIS — K625 Hemorrhage of anus and rectum: Secondary | ICD-10-CM | POA: Diagnosis not present

## 2017-06-22 DIAGNOSIS — D175 Benign lipomatous neoplasm of intra-abdominal organs: Secondary | ICD-10-CM | POA: Diagnosis not present

## 2017-06-22 DIAGNOSIS — I10 Essential (primary) hypertension: Secondary | ICD-10-CM | POA: Diagnosis not present

## 2017-06-22 DIAGNOSIS — D125 Benign neoplasm of sigmoid colon: Secondary | ICD-10-CM

## 2017-06-22 MED ORDER — SODIUM CHLORIDE 0.9 % IV SOLN: 500.0000 mL | INTRAVENOUS | Status: DC

## 2017-06-22 NOTE — Progress Notes (Signed)
Report given to PACU, vss 

## 2017-06-22 NOTE — Patient Instructions (Signed)
**   Handouts given on polyps, hemorrhoids, and diverticulosis. **   YOU HAD AN ENDOSCOPIC PROCEDURE TODAY AT THE [facility]:   Refer to the procedure report that was given to you for any specific questions about what was found during the examination.  If the procedure report does not answer your questions, please call your gastroenterologist to clarify.  If you requested that your care partner not be given the details of your procedure findings, then the procedure report has been included in a sealed envelope for you to review at your convenience later.  YOU SHOULD EXPECT: Some feelings of bloating in the abdomen. Passage of more gas than usual.  Walking can help get rid of the air that was put into your GI tract during the procedure and reduce the bloating. If you had a lower endoscopy (such as a colonoscopy or flexible sigmoidoscopy) you may notice spotting of blood in your stool or on the toilet paper. If you underwent a bowel prep for your procedure, you may not have a normal bowel movement for a few days.  Please Note:  You might notice some irritation and congestion in your nose or some drainage.  This is from the oxygen used during your procedure.  There is no need for concern and it should clear up in a day or so.  SYMPTOMS TO REPORT IMMEDIATELY:   Following lower endoscopy (colonoscopy or flexible sigmoidoscopy):  Excessive amounts of blood in the stool  Significant tenderness or worsening of abdominal pains  Swelling of the abdomen that is new, acute  Fever of 100F or higher   For urgent or emergent issues, a gastroenterologist can be reached at any hour by calling [phone].   DIET:  We do recommend a small meal at first, but then you may proceed to your regular diet.  Drink plenty of fluids but you should avoid alcoholic beverages for 24 hours.  ACTIVITY:  You should plan to take it easy for the rest of today and you should NOT DRIVE or use heavy machinery until  tomorrow (because of the sedation medicines used during the test).    FOLLOW UP: Our staff will call the number listed on your records the next business day following your procedure to check on you and address any questions or concerns that you may have regarding the information given to you following your procedure. If we do not reach you, we will leave a message.  However, if you are feeling well and you are not experiencing any problems, there is no need to return our call.  We will assume that you have returned to your regular daily activities without incident.  If any biopsies were taken you will be contacted by phone or by letter within the next 1-3 weeks.  Please call us at [phone] if you have not heard about the biopsies in 3 weeks.    SIGNATURES/CONFIDENTIALITY: You and/or your care partner have signed paperwork which will be entered into your electronic medical record.  These signatures attest to the fact that that the information above on your After Visit Summary has been reviewed and is understood.  Full responsibility of the confidentiality of this discharge information lies with you and/or your care-partner.

## 2017-06-22 NOTE — Progress Notes (Signed)
Called to room to assist during endoscopic procedure.  Patient ID and intended procedure confirmed with present staff. Received instructions for my participation in the procedure from the performing physician.  

## 2017-06-22 NOTE — Op Note (Signed)
Donald Butler Patient Name: Donald Butler Procedure Date: 06/22/2017 9:03 AM MRN: [id number] Endoscopist: Ladene Artist , MD Age: 56 Referring MD:  Date of Birth: June 26, 1961 Gender: Male Account #: [id number] Procedure:                Colonoscopy Indications:              Surveillance: Personal history of adenomatous                            polyps on last colonoscopy 5 years ago Medicines:                Monitored Anesthesia Care Procedure:                Pre-Anesthesia Assessment:                           - Prior to the procedure, a History and Physical                            was performed, and patient medications and                            allergies were reviewed. The patient's tolerance of                            previous anesthesia was also reviewed. The risks                            and benefits of the procedure and the sedation                            options and risks were discussed with the patient.                            All questions were answered, and informed consent                            was obtained. Prior Anticoagulants: The patient has                            taken no previous anticoagulant or antiplatelet                            agents. ASA Grade Assessment: III - A patient with                            severe systemic disease. After reviewing the risks                            and benefits, the patient was deemed in                            satisfactory condition to undergo the procedure.  After obtaining informed consent, the colonoscope                            was passed under direct vision. Throughout the                            procedure, the patient's blood pressure, pulse, and                            oxygen saturations were monitored continuously. The                            Model PCF-[id number]) scope was introduced                            through the anus and  advanced to the the cecum,                            identified by appendiceal orifice and ileocecal                            valve. The ileocecal valve, appendiceal orifice,                            and rectum were photographed. The quality of the                            bowel preparation was good. The colonoscopy was                            performed without difficulty. The patient tolerated                            the procedure well. Scope In: 9:18:16 AM Scope Out: 9:30:33 AM Scope Withdrawal Time: 0 hours 11 minutes 4 seconds  Total Procedure Duration: 0 hours 12 minutes 17 seconds  Findings:                 The perianal and digital rectal examinations were                            normal.                           The ileocecal valve was moderately lipomatous.                            Biopsies were taken with a cold forceps for                            histology.                           Three sessile polyps were found in the sigmoid  colon (2) and transverse colon (1). The polyps were                            6 to 7 mm in size. These polyps were removed with a                            cold snare. Resection and retrieval were complete.                           Multiple medium-mouthed diverticula were found in                            the left colon. There was narrowing of the colon in                            association with the diverticular opening. There                            was evidence of diverticular spasm. There was no                            evidence of diverticular bleeding.                           Internal hemorrhoids were found during                            retroflexion. The hemorrhoids were medium-sized and                            Grade II (internal hemorrhoids that prolapse but                            reduce spontaneously).                           The exam was otherwise without abnormality on                             direct and retroflexion views. Complications:            No immediate complications. Estimated blood loss:                            None. Estimated Blood Loss:     Estimated blood loss: none. Impression:               - Lipomatous IC valve.                           - Three 6 to 7 mm polyps in the sigmoid colon and                            in the transverse colon, removed with a cold snare.  Resected and retrieved.                           - Moderate diverticulosis in the left colon. There                            was narrowing of the colon in association with the                            diverticular opening. There was evidence of                            diverticular spasm. There was no evidence of                            diverticular bleeding.                           - Internal hemorrhoids.                           - The examination was otherwise normal on direct                            and retroflexion views. Recommendation:           - Repeat colonoscopy in 5 years for surveillance.                           - Patient has a contact number available for                            emergencies. The signs and symptoms of potential                            delayed complications were discussed with the                            patient. Return to normal activities tomorrow.                            Written discharge instructions were provided to the                            patient.                           - Continue present medications.                           - Await pathology results.                           - High fiber diet indefinitely.                           - Schedule hemorrhoidal banding. Pricilla Riffle  Fuller Plan, MD 06/22/2017 9:34:00 AM This report has been signed electronically.

## 2017-06-23 ENCOUNTER — Telehealth: Payer: Self-pay | Admitting: *Deleted

## 2017-06-23 NOTE — Telephone Encounter (Signed)
  Follow up Call-  Call back number 06/22/2017  Post procedure Call Back phone  # [phone]  Permission to leave phone message Yes  Some recent data might be hidden     Patient questions:  Do you have a fever, pain , or abdominal swelling? No. Pain Score  0 *  Have you tolerated food without any problems? Yes.    Have you been able to return to your normal activities? Yes.    Do you have any questions about your discharge instructions: Diet   Yes.   Medications  No. Follow up visit  No.  Do you have questions or concerns about your Care? No.  Actions: * If pain score is 4 or above: No action needed, pain <4.

## 2017-06-30 ENCOUNTER — Encounter: Payer: Self-pay | Admitting: Gastroenterology

## 2017-07-14 ENCOUNTER — Encounter: Payer: Medicare Other | Admitting: Internal Medicine

## 2017-07-20 ENCOUNTER — Encounter: Payer: Self-pay | Admitting: Physician Assistant

## 2017-07-20 ENCOUNTER — Ambulatory Visit (INDEPENDENT_AMBULATORY_CARE_PROVIDER_SITE_OTHER): Payer: Medicare Other | Admitting: Physician Assistant

## 2017-07-20 VITALS — BP 174/98 | HR 67 | Ht 68.0 in | Wt 244.0 lb

## 2017-07-20 DIAGNOSIS — I10 Essential (primary) hypertension: Secondary | ICD-10-CM

## 2017-07-20 DIAGNOSIS — G5702 Lesion of sciatic nerve, left lower limb: Secondary | ICD-10-CM | POA: Diagnosis not present

## 2017-07-20 DIAGNOSIS — E785 Hyperlipidemia, unspecified: Secondary | ICD-10-CM

## 2017-07-20 DIAGNOSIS — K219 Gastro-esophageal reflux disease without esophagitis: Secondary | ICD-10-CM

## 2017-07-20 MED ORDER — AMLODIPINE BESYLATE 5 MG PO TABS: 5.0000 mg | ORAL_TABLET | Freq: Every day | ORAL | 2 refills | Status: DC

## 2017-07-20 MED ORDER — AMLODIPINE BESYLATE 5 MG PO TABS: 10.0000 mg | ORAL_TABLET | Freq: Every day | ORAL | 2 refills | Status: DC

## 2017-07-20 MED ORDER — HYDROCODONE-ACETAMINOPHEN 10-325 MG PO TABS: 1.0000 | ORAL_TABLET | Freq: Three times a day (TID) | ORAL | 0 refills | Status: DC | PRN

## 2017-07-20 NOTE — Progress Notes (Signed)
BP (!) 174/98   Pulse 67   Ht 5\' 8"  (1.727 m)   Wt 244 lb (110.7 kg)   BMI 37.10 kg/m    Subjective:    Patient ID: Donald Butler, male    DOB: [birth year], 56 y.o.   MRN: [id number]  HPI: Donald Butler is a 56 y.o. 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  Overall he is about the same.  He has continued with disability.  He has been ordered Social Security disability.  He is doing well with his medications.  He hardly takes any pain medicine at all.  Last prescription was many months ago for #30 of hydrocodone.  We will print another prescription today.  Blood pressure is elevated today.  We are going to add amlodipine 5 mg 1 daily and recheck him in a month.  We have also discuss lowering sodium in his diet..   All medications are reviewed today. There are no reports of any problems with the medications. All of the medical conditions are reviewed and updated.  Lab work is reviewed and will be ordered as medically necessary. There are no new problems reported with today's visit.   Relevant past medical, surgical, family and social history reviewed and updated as indicated. Allergies and medications reviewed and updated.  Past Medical History:  Diagnosis Date  . Allergy   . GERD (gastroesophageal reflux disease)   . Gout   . Hyperlipidemia   . Hypertension   . Internal hemorrhoid   . Nerve damage    left leg  . 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 [place]   ruptured disk  x 2  . CARPAL TUNNEL RELEASE Left   . COLONOSCOPY    . EXTERNAL EAR SURGERY    . KNEE ARTHROSCOPY  2006, 2008   x 2  left  . MASTOIDECTOMY  2013   right  . OPEN ANTERIOR SHOULDER  RECONSTRUCTION  1970   right  . POLYPECTOMY    . reconstruction left leg  2009   multiple surgery after accident    Review of Systems  Constitutional: Negative.  Negative for appetite change and fatigue.  HENT: Negative.   Eyes: Negative.  Negative for pain and visual disturbance.  Respiratory: Negative.  Negative for cough, chest tightness, shortness of breath and wheezing.   Cardiovascular: Negative.  Negative for chest pain, palpitations and leg swelling.  Gastrointestinal: Negative.  Negative for abdominal pain, diarrhea, nausea and vomiting.  Endocrine: Negative.   Genitourinary: Negative.   Musculoskeletal: Positive for arthralgias and gait problem.  Skin: Negative.  Negative for color change and rash.  Neurological: Negative for weakness, numbness and headaches.  Psychiatric/Behavioral: Negative.     Allergies as of 07/20/2017      Reactions   Penicillins Hives, Shortness Of Breath, Swelling      Medication List        Accurate as of 07/20/17  9:37 AM. Always use your most recent med list.          allopurinol 300 MG tablet Commonly known as:  ZYLOPRIM Take 300 mg by mouth daily.   amLODipine 5 MG tablet Commonly known as:  NORVASC Take 1 tablet (5 mg  total) by mouth daily.   aspirin 325 MG tablet Take 325 mg by mouth daily.   HYDROcodone-acetaminophen 10-325 MG tablet Commonly known as:  NORCO Take 1 tablet by mouth every 8 (eight) hours as needed.   lisinopril 40 MG tablet Commonly known as:  PRINIVIL,ZESTRIL TAKE 1 TABLET BY MOUTH ONCE DAILY   loratadine 10 MG tablet Commonly known as:  CLARITIN Take 10 mg by mouth daily.   lovastatin 20 MG tablet Commonly known as:  MEVACOR Take 1 tablet (20 mg total) by mouth at bedtime.   multivitamin tablet Take 1 tablet by mouth daily.   omeprazole 20 MG capsule Commonly known as:  PRILOSEC Take 20 mg by mouth daily.   VITAMIN B-12 PO Take 5,000 mcg by mouth daily.          Objective:    BP  (!) 174/98   Pulse 67   Ht 5\' 8"  (1.727 m)   Wt 244 lb (110.7 kg)   BMI 37.10 kg/m   Allergies  Allergen Reactions  . Penicillins Hives, Shortness Of Breath and Swelling    Physical Exam  Constitutional: He appears well-developed and well-nourished.  HENT:  Head: Normocephalic and atraumatic.  Eyes: Conjunctivae and EOM are normal. Pupils are equal, round, and reactive to light.  Neck: Normal range of motion. Neck supple.  Cardiovascular: Normal rate, regular rhythm and normal heart sounds.  Pulmonary/Chest: Effort normal and breath sounds normal.  Abdominal: Soft. Bowel sounds are normal.  Musculoskeletal: Normal range of motion.  Skin: Skin is warm and dry.        Assessment & Plan:   1. Essential hypertension - amLODipine (NORVASC) 5 MG tablet; Take 1 tablet (5 mg total) by mouth daily.  Dispense: 30 tablet; Refill: 2  2. Gastroesophageal reflux disease, esophagitis presence not specified  3. Hyperlipidemia, unspecified hyperlipidemia type  4. Compression of common peroneal nerve, left - HYDROcodone-acetaminophen (NORCO) 10-325 MG tablet; Take 1 tablet by mouth every 8 (eight) hours as needed.  Dispense: 30 tablet; Refill: 0    Current Outpatient Medications:  .  aspirin 325 MG tablet, Take 325 mg by mouth daily., Disp: , Rfl:  .  Cyanocobalamin (VITAMIN B-12 PO), Take 5,000 mcg by mouth daily., Disp: , Rfl:  .  HYDROcodone-acetaminophen (NORCO) 10-325 MG tablet, Take 1 tablet by mouth every 8 (eight) hours as needed., Disp: 30 tablet, Rfl: 0 .  lisinopril (PRINIVIL,ZESTRIL) 40 MG tablet, TAKE 1 TABLET BY MOUTH ONCE DAILY, Disp: 90 tablet, Rfl: 0 .  loratadine (CLARITIN) 10 MG tablet, Take 10 mg by mouth daily., Disp: , Rfl:  .  lovastatin (MEVACOR) 20 MG tablet, Take 1 tablet (20 mg total) by mouth at bedtime., Disp: 90 tablet, Rfl: 3 .  Multiple Vitamin (MULTIVITAMIN) tablet, Take 1 tablet by mouth daily., Disp: , Rfl:  .  omeprazole (PRILOSEC) 20 MG capsule, Take  20 mg by mouth daily., Disp: , Rfl:  .  allopurinol (ZYLOPRIM) 300 MG tablet, Take 300 mg by mouth daily., Disp: , Rfl:  .  amLODipine (NORVASC) 5 MG tablet, Take 1 tablet (5 mg total) by mouth daily., Disp: 30 tablet, Rfl: 2 Continue all other maintenance medications as listed above.  Follow up plan: Return in about 4 weeks (around 08/17/2017) for recheck BP.  Educational handout given for [place] PA-C [place] [address]  [place], [place] [address] [phone]   07/20/2017, 9:37 AM

## 2017-07-20 NOTE — Patient Instructions (Addendum)
DASH Eating Plan DASH stands for "Dietary Approaches to Stop Hypertension." The DASH eating plan is a healthy eating plan that has been shown to reduce high blood pressure (hypertension). It may also reduce your risk for type 2 diabetes, heart disease, and stroke. The DASH eating plan may also help with weight loss. What are tips for following this plan? General guidelines  Avoid eating more than 2,300 mg (milligrams) of salt (sodium) a day. If you have hypertension, you may need to reduce your sodium intake to 1,500 mg a day.  Limit alcohol intake to no more than 1 drink a day for nonpregnant women and 2 drinks a day for men. One drink equals 12 oz of beer, 5 oz of wine, or 1 oz of hard liquor.  Work with your health care provider to maintain a healthy body weight or to lose weight. Ask what an ideal weight is for you.  Get at least 30 minutes of exercise that causes your heart to beat faster (aerobic exercise) most days of the week. Activities may include walking, swimming, or biking.  Work with your health care provider or diet and nutrition specialist (dietitian) to adjust your eating plan to your individual calorie needs. Reading food labels  Check food labels for the amount of sodium per serving. Choose foods with less than 5 percent of the Daily Value of sodium. Generally, foods with less than 300 mg of sodium per serving fit into this eating plan.  To find whole grains, look for the word "whole" as the first word in the ingredient list. Shopping  Buy products labeled as "low-sodium" or "no salt added."  Buy fresh foods. Avoid canned foods and premade or frozen meals. Cooking  Avoid adding salt when cooking. Use salt-free seasonings or herbs instead of table salt or sea salt. Check with your health care provider or pharmacist before using salt substitutes.  Do not fry foods. Cook foods using healthy methods such as baking, boiling, grilling, and broiling instead.  Cook with  heart-healthy oils, such as olive, canola, soybean, or sunflower oil. Meal planning   Eat a balanced diet that includes: ? 5 or more servings of fruits and vegetables each day. At each meal, try to fill half of your plate with fruits and vegetables. ? Up to 6-8 servings of whole grains each day. ? Less than 6 oz of lean meat, poultry, or fish each day. A 3-oz serving of meat is about the same size as a deck of cards. One egg equals 1 oz. ? 2 servings of low-fat dairy each day. ? A serving of nuts, seeds, or beans 5 times each week. ? Heart-healthy fats. Healthy fats called Omega-3 fatty acids are found in foods such as flaxseeds and coldwater fish, like sardines, salmon, and mackerel.  Limit how much you eat of the following: ? Canned or prepackaged foods. ? Food that is high in trans fat, such as fried foods. ? Food that is high in saturated fat, such as fatty meat. ? Sweets, desserts, sugary drinks, and other foods with added sugar. ? Full-fat dairy products.  Do not salt foods before eating.  Try to eat at least 2 vegetarian meals each week.  Eat more home-cooked food and less restaurant, buffet, and fast food.  When eating at a restaurant, ask that your food be prepared with less salt or no salt, if possible. What foods are recommended? The items listed may not be a complete list. Talk with your dietitian about what   dietary choices are best for you. Grains Whole-grain or whole-wheat bread. Whole-grain or whole-wheat pasta. Brown rice. Oatmeal. Quinoa. Bulgur. Whole-grain and low-sodium cereals. Pita bread. Low-fat, low-sodium crackers. Whole-wheat flour tortillas. Vegetables Fresh or frozen vegetables (raw, steamed, roasted, or grilled). Low-sodium or reduced-sodium tomato and vegetable juice. Low-sodium or reduced-sodium tomato sauce and tomato paste. Low-sodium or reduced-sodium canned vegetables. Fruits All fresh, dried, or frozen fruit. Canned fruit in natural juice (without  added sugar). Meat and other protein foods Skinless chicken or turkey. Ground chicken or turkey. Pork with fat trimmed off. Fish and seafood. Egg whites. Dried beans, peas, or lentils. Unsalted nuts, nut butters, and seeds. Unsalted canned beans. Lean cuts of beef with fat trimmed off. Low-sodium, lean deli meat. Dairy Low-fat (1%) or fat-free (skim) milk. Fat-free, low-fat, or reduced-fat cheeses. Nonfat, low-sodium ricotta or cottage cheese. Low-fat or nonfat yogurt. Low-fat, low-sodium cheese. Fats and oils Soft margarine without trans fats. Vegetable oil. Low-fat, reduced-fat, or light mayonnaise and salad dressings (reduced-sodium). Canola, safflower, olive, soybean, and sunflower oils. Avocado. Seasoning and other foods Herbs. Spices. Seasoning mixes without salt. Unsalted popcorn and pretzels. Fat-free sweets. What foods are not recommended? The items listed may not be a complete list. Talk with your dietitian about what dietary choices are best for you. Grains Baked goods made with fat, such as croissants, muffins, or some breads. Dry pasta or rice meal packs. Vegetables Creamed or fried vegetables. Vegetables in a cheese sauce. Regular canned vegetables (not low-sodium or reduced-sodium). Regular canned tomato sauce and paste (not low-sodium or reduced-sodium). Regular tomato and vegetable juice (not low-sodium or reduced-sodium). Pickles. Olives. Fruits Canned fruit in a light or heavy syrup. Fried fruit. Fruit in cream or butter sauce. Meat and other protein foods Fatty cuts of meat. Ribs. Fried meat. Bacon. Sausage. Bologna and other processed lunch meats. Salami. Fatback. Hotdogs. Bratwurst. Salted nuts and seeds. Canned beans with added salt. Canned or smoked fish. Whole eggs or egg yolks. Chicken or turkey with skin. Dairy Whole or 2% milk, cream, and half-and-half. Whole or full-fat cream cheese. Whole-fat or sweetened yogurt. Full-fat cheese. Nondairy creamers. Whipped toppings.  Processed cheese and cheese spreads. Fats and oils Butter. Stick margarine. Lard. Shortening. Ghee. Bacon fat. Tropical oils, such as coconut, palm kernel, or palm oil. Seasoning and other foods Salted popcorn and pretzels. Onion salt, garlic salt, seasoned salt, table salt, and sea salt. Worcestershire sauce. Tartar sauce. Barbecue sauce. Teriyaki sauce. Soy sauce, including reduced-sodium. Steak sauce. Canned and packaged gravies. Fish sauce. Oyster sauce. Cocktail sauce. Horseradish that you find on the shelf. Ketchup. Mustard. Meat flavorings and tenderizers. Bouillon cubes. Hot sauce and Tabasco sauce. Premade or packaged marinades. Premade or packaged taco seasonings. Relishes. Regular salad dressings. Where to find more information:  National Heart, Lung, and Blood Institute: www.nhlbi.nih.gov  American Heart Association: www.heart.org Summary  The DASH eating plan is a healthy eating plan that has been shown to reduce high blood pressure (hypertension). It may also reduce your risk for type 2 diabetes, heart disease, and stroke.  With the DASH eating plan, you should limit salt (sodium) intake to 2,300 mg a day. If you have hypertension, you may need to reduce your sodium intake to 1,500 mg a day.  When on the DASH eating plan, aim to eat more fresh fruits and vegetables, whole grains, lean proteins, low-fat dairy, and heart-healthy fats.  Work with your health care provider or diet and nutrition specialist (dietitian) to adjust your eating plan to your individual   calorie needs. This information is not intended to replace advice given to you by your health care provider. Make sure you discuss any questions you have with your health care provider. Document Released: 07/24/2011 Document Revised: 07/28/2016 Document Reviewed: 07/28/2016 Elsevier Interactive Patient Education  2017 Elsevier Inc.  

## 2017-08-03 ENCOUNTER — Other Ambulatory Visit: Payer: Self-pay | Admitting: Family Medicine

## 2017-08-20 ENCOUNTER — Encounter: Payer: Self-pay | Admitting: Physician Assistant

## 2017-08-20 ENCOUNTER — Ambulatory Visit (INDEPENDENT_AMBULATORY_CARE_PROVIDER_SITE_OTHER): Payer: Medicare Other | Admitting: Physician Assistant

## 2017-08-20 VITALS — BP 154/95 | HR 73 | Ht 68.0 in | Wt 241.0 lb

## 2017-08-20 DIAGNOSIS — I10 Essential (primary) hypertension: Secondary | ICD-10-CM | POA: Diagnosis not present

## 2017-08-20 MED ORDER — AMLODIPINE BESYLATE 5 MG PO TABS: 5.0000 mg | ORAL_TABLET | Freq: Every day | ORAL | 3 refills | Status: DC

## 2017-08-20 NOTE — Patient Instructions (Signed)
In a few days you may receive a survey in the mail or online from [employer] regarding your visit with us today. Please take a moment to fill this out. Your feedback is very important to our whole office. It can help us better understand your needs as well as improve your experience and satisfaction. Thank you for taking your time to complete it. We care about you.  Letisha Yera, PA-C  

## 2017-08-20 NOTE — Progress Notes (Signed)
BP (!) 154/95   Pulse 73   Ht 5\' 8"  (1.727 m)   Wt 241 lb (109.3 kg)   BMI 36.64 kg/m    Subjective:    Patient ID: Donald Butler, male    DOB: 1961-06-07, 57 y.o.   MRN: [id number]  HPI: Donald Butler is a 57 y.o. male presenting on 08/20/2017 for Follow-up (1 month recheck )  This patient comes in for 1 month recheck on his blood pressure.  He was started on amlodipine 5 mg 1 daily last month.  He states he is tolerating it very well.  He notes that his blood pressure has been decreasing.  He has the ability to check it at [facility].  He is down 3 pounds since his last visit and that is even over the holidays.  We have encouraged him to continue with his diet and exercise.  We will plan to recheck him in 6 months.  Relevant past medical, surgical, family and social history reviewed and updated as indicated. Allergies and medications reviewed and updated.  Past Medical History:  Diagnosis Date  . Allergy   . GERD (gastroesophageal reflux disease)   . Gout   . Hyperlipidemia   . Hypertension   . Internal hemorrhoid   . Nerve damage    left leg  . 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 [place]   ruptured disk  x 2  . CARPAL TUNNEL RELEASE Left   . COLONOSCOPY    . EXTERNAL EAR SURGERY    . KNEE ARTHROSCOPY  2006, 2008   x 2  left  . MASTOIDECTOMY  2013   right  . OPEN ANTERIOR SHOULDER RECONSTRUCTION  1970   right  . POLYPECTOMY    . reconstruction left leg  2009   multiple surgery after accident    Review of Systems  Constitutional: Negative.  Negative for appetite change and fatigue.  HENT: Negative.   Eyes: Negative.  Negative for pain and visual disturbance.  Respiratory: Negative.  Negative for cough, chest tightness, shortness of breath and wheezing.   Cardiovascular: Negative.  Negative for chest pain, palpitations and leg swelling.    Gastrointestinal: Negative.  Negative for abdominal pain, diarrhea, nausea and vomiting.  Endocrine: Negative.   Genitourinary: Negative.   Musculoskeletal: Negative.   Skin: Negative.  Negative for color change and rash.  Neurological: Negative.  Negative for weakness, numbness and headaches.  Psychiatric/Behavioral: Negative.     Allergies as of 08/20/2017      Reactions   Penicillins Hives, Shortness Of Breath, Swelling      Medication List        Accurate as of 08/20/17  1:42 PM. Always use your most recent med list.          amLODipine 5 MG tablet Commonly known as:  NORVASC Take 1 tablet (5 mg total) by mouth daily.   aspirin 325 MG tablet Take 325 mg by mouth daily.   HYDROcodone-acetaminophen 10-325 MG tablet Commonly known as:  NORCO Take 1 tablet by mouth every 8 (eight) hours as needed.   lisinopril 40 MG tablet Commonly known as:  PRINIVIL,ZESTRIL TAKE 1 TABLET BY MOUTH ONCE DAILY   loratadine 10 MG tablet Commonly known as:  CLARITIN Take 10 mg by mouth daily.   lovastatin 20 MG tablet Commonly known as:  MEVACOR Take 1  tablet (20 mg total) by mouth at bedtime.   multivitamin tablet Take 1 tablet by mouth daily.   omeprazole 20 MG capsule Commonly known as:  PRILOSEC Take 20 mg by mouth daily.   VITAMIN B-12 PO Take 5,000 mcg by mouth daily.          Objective:    BP (!) 154/95   Pulse 73   Ht 5\' 8"  (1.727 m)   Wt 241 lb (109.3 kg)   BMI 36.64 kg/m   Allergies  Allergen Reactions  . Penicillins Hives, Shortness Of Breath and Swelling    Physical Exam  Constitutional: He appears well-developed and well-nourished. No distress.  HENT:  Head: Normocephalic and atraumatic.  Eyes: Conjunctivae and EOM are normal. Pupils are equal, round, and reactive to light.  Cardiovascular: Normal rate, regular rhythm and normal heart sounds.  Pulmonary/Chest: Effort normal and breath sounds normal. No respiratory distress.  Skin: Skin is warm and  dry.  Psychiatric: He has a normal mood and affect. His behavior is normal.  Nursing note and vitals reviewed.       Assessment & Plan:   1. Essential hypertension - amLODipine (NORVASC) 5 MG tablet; Take 1 tablet (5 mg total) by mouth daily.  Dispense: 90 tablet; Refill: 3 Continue weight loss and increase exercise   Current Outpatient Medications:  .  amLODipine (NORVASC) 5 MG tablet, Take 1 tablet (5 mg total) by mouth daily., Disp: 90 tablet, Rfl: 3 .  aspirin 325 MG tablet, Take 325 mg by mouth daily., Disp: , Rfl:  .  Cyanocobalamin (VITAMIN B-12 PO), Take 5,000 mcg by mouth daily., Disp: , Rfl:  .  HYDROcodone-acetaminophen (NORCO) 10-325 MG tablet, Take 1 tablet by mouth every 8 (eight) hours as needed., Disp: 30 tablet, Rfl: 0 .  lisinopril (PRINIVIL,ZESTRIL) 40 MG tablet, TAKE 1 TABLET BY MOUTH ONCE DAILY, Disp: 90 tablet, Rfl: 1 .  loratadine (CLARITIN) 10 MG tablet, Take 10 mg by mouth daily., Disp: , Rfl:  .  lovastatin (MEVACOR) 20 MG tablet, Take 1 tablet (20 mg total) by mouth at bedtime., Disp: 90 tablet, Rfl: 3 .  Multiple Vitamin (MULTIVITAMIN) tablet, Take 1 tablet by mouth daily., Disp: , Rfl:  .  omeprazole (PRILOSEC) 20 MG capsule, Take 20 mg by mouth daily., Disp: , Rfl:  Continue all other maintenance medications as listed above.  Follow up plan: Return in about 3 months (around 11/18/2017) for well.  Educational handout given for [place] PA-C [place] [address]  [place], [place] [address] [phone]   08/20/2017, 1:42 PM

## 2017-08-26 ENCOUNTER — Other Ambulatory Visit: Payer: Self-pay | Admitting: Family Medicine

## 2017-08-27 NOTE — Telephone Encounter (Signed)
Last lipid 05/29/16

## 2017-12-01 ENCOUNTER — Telehealth: Payer: Self-pay | Admitting: Physician Assistant

## 2017-12-01 ENCOUNTER — Encounter: Payer: Self-pay | Admitting: Family Medicine

## 2017-12-01 ENCOUNTER — Ambulatory Visit (INDEPENDENT_AMBULATORY_CARE_PROVIDER_SITE_OTHER): Payer: Medicare Other | Admitting: Family Medicine

## 2017-12-01 VITALS — BP 145/89 | HR 79 | Temp 99.4°F | Ht 68.0 in | Wt 240.0 lb

## 2017-12-01 DIAGNOSIS — M10072 Idiopathic gout, left ankle and foot: Secondary | ICD-10-CM | POA: Diagnosis not present

## 2017-12-01 MED ORDER — ALLOPURINOL 300 MG PO TABS: 300.0000 mg | ORAL_TABLET | Freq: Every day | ORAL | 6 refills | Status: DC

## 2017-12-01 MED ORDER — COLCHICINE 0.6 MG PO TABS: 0.6000 mg | ORAL_TABLET | Freq: Two times a day (BID) | ORAL | 0 refills | Status: DC

## 2017-12-01 NOTE — Telephone Encounter (Signed)
Allopurinol sent to the pharmacy for the patient that his request

## 2017-12-01 NOTE — Progress Notes (Addendum)
Subjective:  Patient ID: Donald Butler, male    DOB: 1960/11/03  Age: 57 y.o. MRN: [id number]  CC: Gout (pt here today c/o gout in left big toe and needs refill on allopurinol)   HPI [name] presents for Patient presents today for follow-up of gout.  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  He then developed gout in the right lower extremity.  Subsequently he went on allopurinol for several years.  Eventually the left lower extremity neuropathy cleared.  Since the gout was thought to be caused by overcompensation of the right leg, he thought that perhaps the gout would not come back when he no longer had to overuse the right leg.  Therefore he quit using the allopurinol about a year ago.  Until today he had not had any further gout.  This morning about 3 AM, he awoke with excruciating pain in the left great toe.  He presents insisting on starting back on his allopurinol due to 10/10 pain.  It is an excruciating throbbing located at the left first MTP region.  He is unable to bear weight on the toe.  He has to walk on his heel.  Depression screen [facility] 2/9 12/01/2017 08/20/2017 07/20/2017  Decreased Interest 0 0 0  Down, Depressed, Hopeless 0 0 0  PHQ - 2 Score 0 0 0    History Donald Butler has a past medical history of Allergy, GERD (gastroesophageal reflux disease), Gout, Hyperlipidemia, Hypertension, Internal hemorrhoid, Nerve damage, and Neuromuscular disorder ([place]).   He has a past surgical history that includes Back surgery (1982, 1983); Open anterior shoulder reconstruction (1970); Knee arthroscopy (2006, 2008); reconstruction left leg (2009); Mastoidectomy (2013); Carpal tunnel release (Left); Colonoscopy; Polypectomy; and External ear surgery.   His family history includes Diabetes in his father.He reports that he quit smoking about 29 years ago. He quit smokeless tobacco use about 5 years ago. His smokeless tobacco use included chew. He reports that he drinks  about 10.5 oz of alcohol per week. He reports that he does not use drugs.    ROS Review of Systems  Constitutional: Positive for activity change. Negative for appetite change, fatigue and fever.  HENT: Negative.   Gastrointestinal: Positive for nausea. Negative for abdominal pain and vomiting.  Musculoskeletal: Positive for arthralgias, gait problem, joint swelling and myalgias.  Skin: Positive for rash and wound.  Psychiatric/Behavioral: Positive for agitation.    Objective:  BP (!) 145/89   Pulse 79   Temp 99.4 F (37.4 C) (Oral)   Ht 5\' 8"  (1.727 m)   Wt 240 lb (108.9 kg)   BMI 36.49 kg/m   BP Readings from Last 3 Encounters:  12/01/17 (!) 145/89  08/20/17 (!) 154/95  07/20/17 (!) 174/98    Wt Readings from Last 3 Encounters:  12/01/17 240 lb (108.9 kg)  08/20/17 241 lb (109.3 kg)  07/20/17 244 lb (110.7 kg)     Physical Exam  Musculoskeletal: He exhibits edema (Left first MTP.  Unable to freely move the toe.  Upon 10 degrees passive plantar flexion the patient literally jumped out of his seat.) and tenderness (With violaceous erythema at left first MTP.).  Skin: Skin is warm and dry.  Psychiatric: His mood appears anxious. His affect is angry. He is agitated. He expresses impulsivity.      Assessment & Plan:   Lorne was seen today for gout.  Diagnoses and all orders for this visit:  Acute idiopathic gout involving  toe of left foot  Other orders -     Discontinue: colchicine 0.6 MG tablet; Take 1 tablet (0.6 mg total) by mouth 2 (two) times daily. -     allopurinol (ZYLOPRIM) 300 MG tablet; Take 1 tablet (300 mg total) by mouth daily.       I have discontinued [name] "Keith"'s colchicine. I am also having him start on allopurinol. Additionally, I am having him maintain his multivitamin, aspirin, omeprazole, loratadine, lovastatin, Cyanocobalamin (VITAMIN B-12 PO), HYDROcodone-acetaminophen, lisinopril, amLODipine, and  atorvastatin.  Allergies as of 12/01/2017      Reactions   Penicillins Hives, Shortness Of Breath, Swelling      Medication List        Accurate as of 12/01/17  7:32 PM. Always use your most recent med list.          allopurinol 300 MG tablet Commonly known as:  ZYLOPRIM Take 1 tablet (300 mg total) by mouth daily.   amLODipine 5 MG tablet Commonly known as:  NORVASC Take 1 tablet (5 mg total) by mouth daily.   aspirin 325 MG tablet Take 325 mg by mouth daily.   atorvastatin 20 MG tablet Commonly known as:  LIPITOR TAKE ONE TABLET BY MOUTH ONCE DAILY   HYDROcodone-acetaminophen 10-325 MG tablet Commonly known as:  NORCO Take 1 tablet by mouth every 8 (eight) hours as needed.   lisinopril 40 MG tablet Commonly known as:  PRINIVIL,ZESTRIL TAKE 1 TABLET BY MOUTH ONCE DAILY   loratadine 10 MG tablet Commonly known as:  CLARITIN Take 10 mg by mouth daily.   lovastatin 20 MG tablet Commonly known as:  MEVACOR Take 1 tablet (20 mg total) by mouth at bedtime.   multivitamin tablet Take 1 tablet by mouth daily.   omeprazole 20 MG capsule Commonly known as:  PRILOSEC Take 20 mg by mouth daily.   VITAMIN B-12 PO Take 5,000 mcg by mouth daily.       I attempted to explain to the patient that allopurinol is used as prevention, not acute treatment.  The patient was visibly upset and angered by this statement.  He asked that I check with his old records to see what had been used or check with Glenard Haring to see what she remembered that she had used for his previous attacks.  When I told him that could take several minutes since the old records were potentially archived from the time she moved to this office from her old office.   He suggested that I should just ask her.  If she remembered him she might know what else she had used. I asked him that if he did not mind waiting a few minutes I could look for them or find her if she was available. He told me to just prescribe what I  thought best since I was the doctor.   I explained to him that we usually used either colchicine or indomethacin.    Patient called back from the pharmacy sometime later stating that colchicine was too expensive.  He spoke to the nurse and insisted that he be prescribed allopurinol.  Because of his insistence,I went ahead with a prescription for allopurinol with a reminder that if things got worse we could try something else at a later date.    Follow-up: Return if symptoms worsen or fail to improve.  Claretta Fraise, M.D.

## 2017-12-01 NOTE — Progress Notes (Deleted)
Chief Complaint  Patient presents with  . Gout    pt here today c/o gout in left big toe and needs refill on allopurinol    HPI  Patient presents today for follow-up of gout.  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  He then developed gout in the right lower extremity.  Subsequently he went on allopurinol for several years.  Eventually the neuropathy cleared up.  He had the thought that his gout in the right leg was caused by overcompensation with the right leg to avoid use of the left.  When he no longer had to do that he thought that perhaps the gout would not come back since he was no longer overusing the right leg.  Therefore he quit using the allopurinol about a year ago.  Until today he had not had any further gout.  This morning about 3 AM he awoke with excruciating pain in the left great toe.  The gout this time was on the left in spite of having been on the right in the past.  He presents this afternoon desiring to start back on his allopurinol.  His pain is 10/10 and he can't even put weight on the toe.  He has to walk on his heel.  PMH: Smoking status noted ROS: Per HPI  Objective: BP (!) 145/89   Pulse 79   Temp 99.4 F (37.4 C) (Oral)   Ht 5\' 8"  (1.727 m)   Wt 240 lb (108.9 kg)   BMI 36.49 kg/m  Gen: NAD, alert, cooperative with exam HEENT: NCAT, EOMI, PERRL Ext: There is moderate edema and bluish red discoloration of the left first MTP joint region.  Is exquisitely tender to touch.  Patient physically jumped backward when I attempt to plantar flex gently the MTP joint minimally.  Otherwise there is in the extremities no edema, and they are warm Neuro: Alert and oriented, No gross deficits  Assessment and plan:  1. Acute idiopathic gout involving toe of left foot     Meds ordered this encounter  Medications  . DISCONTD: colchicine 0.6 MG tablet    Sig: Take 1 tablet (0.6 mg total) by mouth 2 (two) times daily.    Dispense:  60 tablet    Refill:  0  .  allopurinol (ZYLOPRIM) 300 MG tablet    Sig: Take 1 tablet (300 mg total) by mouth daily.    Dispense:  30 tablet    Refill:  6    I tried to educate the patient that allopurinol was meant to be prevented and could actually worsen gout pain in the acute setting.  Initially   Subjective:  Patient ID: Donald Butler, male    DOB: [birth year]  Age: 57 y.o. MRN: [id number]  CC: Gout (pt here today c/o gout in left big toe and needs refill on allopurinol)   HPI Donald Butler presents forFollow-up of diabetes. Patient checks blood sugar at home.   *** fasting and *** postprandial Patient denies symptoms such as polyuria, polydipsia, excessive hunger, nausea No significant hypoglycemic spells noted. Medications reviewed. Pt reports taking them regularly without complication/adverse reaction being reported today.  Checking feet daily. Last eye appt was ***  History Donald Butler has a past medical history of Allergy, GERD (gastroesophageal reflux disease), Gout, Hyperlipidemia, Hypertension, Internal hemorrhoid, Nerve damage, and Neuromuscular disorder ([place]).   He has a past surgical history that includes Back surgery (1982, 1983); Open anterior shoulder reconstruction (1970); Knee arthroscopy (2006, 2008);  reconstruction left leg (2009); Mastoidectomy (2013); Carpal tunnel release (Left); Colonoscopy; Polypectomy; and External ear surgery.   His family history includes Diabetes in his father.He reports that he quit smoking about 29 years ago. He quit smokeless tobacco use about 5 years ago. His smokeless tobacco use included chew. He reports that he drinks about 10.5 oz of alcohol per week. He reports that he does not use drugs.  Current Outpatient Medications on File Prior to Visit  Medication Sig Dispense Refill  . amLODipine (NORVASC) 5 MG tablet Take 1 tablet (5 mg total) by mouth daily. 90 tablet 3  . aspirin 325 MG tablet Take 325 mg by mouth daily.    Marland Kitchen atorvastatin (LIPITOR) 20 MG  tablet TAKE ONE TABLET BY MOUTH ONCE DAILY 90 tablet 3  . Cyanocobalamin (VITAMIN B-12 PO) Take 5,000 mcg by mouth daily.    Marland Kitchen HYDROcodone-acetaminophen (NORCO) 10-325 MG tablet Take 1 tablet by mouth every 8 (eight) hours as needed. 30 tablet 0  . lisinopril (PRINIVIL,ZESTRIL) 40 MG tablet TAKE 1 TABLET BY MOUTH ONCE DAILY 90 tablet 1  . loratadine (CLARITIN) 10 MG tablet Take 10 mg by mouth daily.    Marland Kitchen lovastatin (MEVACOR) 20 MG tablet Take 1 tablet (20 mg total) by mouth at bedtime. 90 tablet 3  . Multiple Vitamin (MULTIVITAMIN) tablet Take 1 tablet by mouth daily.    Marland Kitchen omeprazole (PRILOSEC) 20 MG capsule Take 20 mg by mouth daily.     No current facility-administered medications on file prior to visit.     ROS Review of Systems  Objective:  BP (!) 145/89   Pulse 79   Temp 99.4 F (37.4 C) (Oral)   Ht 5\' 8"  (1.727 m)   Wt 240 lb (108.9 kg)   BMI 36.49 kg/m   BP Readings from Last 3 Encounters:  12/01/17 (!) 145/89  08/20/17 (!) 154/95  07/20/17 (!) 174/98    Wt Readings from Last 3 Encounters:  12/01/17 240 lb (108.9 kg)  08/20/17 241 lb (109.3 kg)  07/20/17 244 lb (110.7 kg)     Physical Exam    Assessment & Plan:   Donald Butler was seen today for gout.  Diagnoses and all orders for this visit:  Acute idiopathic gout involving toe of left foot  Other orders -     Discontinue: colchicine 0.6 MG tablet; Take 1 tablet (0.6 mg total) by mouth 2 (two) times daily. -     allopurinol (ZYLOPRIM) 300 MG tablet; Take 1 tablet (300 mg total) by mouth daily.      I have discontinued Donald K. Falkenstein "Keith"'s colchicine. I am also having him start on allopurinol. Additionally, I am having him maintain his multivitamin, aspirin, omeprazole, loratadine, lovastatin, Cyanocobalamin (VITAMIN B-12 PO), HYDROcodone-acetaminophen, lisinopril, amLODipine, and atorvastatin.  Meds ordered this encounter  Medications  . DISCONTD: colchicine 0.6 MG tablet    Sig: Take 1 tablet (0.6  mg total) by mouth 2 (two) times daily.    Dispense:  60 tablet    Refill:  0  . allopurinol (ZYLOPRIM) 300 MG tablet    Sig: Take 1 tablet (300 mg total) by mouth daily.    Dispense:  30 tablet    Refill:  6     Follow-up: No follow-ups on file.  I attempted to explain to the patient that allopurinol was used as prevention not acute treatment.  The patient was visibly upset and angered by this statement.  He asked that I check with his old  records to see what had been used or check with Donald Butler to see what she remembered that she had used for his previous attacks.  When I told him that could take several minutes since the old records were potentially archived from the time she moved to this office from her old office.  I asked him that if he did not mind waiting a few minutes I could look for them.  He suggested that I should just ask her.  If she remembered him she might know what else she had used.  I explained to him that we usually used either colchicine or indomethacin.  He said there was something he took that made him vomit and he was already nauseous.  He told me to just prescribe what I thought best since I was the doctor. Because of this I went with what I normally prescribe, colchicine   Patient called back from the pharmacy sometime later stating that colchicine was too expensive.  He spoke to the nurse and insisted that he be prescribed allopurinol.  Because of his insistence,I went ahead with a prescription for allopurinol with a reminder that if things got worse we could try something else at a later date. Follow up as needed.  Claretta Fraise, MD

## 2017-12-01 NOTE — Addendum Note (Signed)
Addended by: Claretta Fraise on: 12/01/2017 07:32 PM   Modules accepted: Level of Service

## 2017-12-01 NOTE — Telephone Encounter (Signed)
Colchicine is $300. Patient is requesting that we call in allopurinol instead because it is $30. Needs to be sent to [facility] in [place] if approved.

## 2017-12-02 NOTE — Telephone Encounter (Signed)
Aware ,script ready. 

## 2017-12-17 ENCOUNTER — Telehealth: Payer: Self-pay | Admitting: Physician Assistant

## 2017-12-17 ENCOUNTER — Encounter: Payer: Self-pay | Admitting: Family Medicine

## 2017-12-17 ENCOUNTER — Ambulatory Visit (INDEPENDENT_AMBULATORY_CARE_PROVIDER_SITE_OTHER): Payer: Medicare Other | Admitting: Family Medicine

## 2017-12-17 VITALS — BP 158/92 | HR 89 | Temp 97.8°F | Ht 68.0 in | Wt 240.0 lb

## 2017-12-17 DIAGNOSIS — H5789 Other specified disorders of eye and adnexa: Secondary | ICD-10-CM

## 2017-12-17 NOTE — Telephone Encounter (Signed)
I recommended that if symptoms got worse or returned that he see Dr Marin Comment.  I will call to see if he can be seen.  Please have him call her office for an appointment as well.

## 2017-12-17 NOTE — Telephone Encounter (Signed)
Patient went to see Dr. Marin Comment

## 2017-12-17 NOTE — Progress Notes (Signed)
Subjective: CC: "something in eye" PCP: Terald Sleeper, PA-C OAC:ZYSAYT Donald Butler is a 57 y.o. male presenting to clinic today for:  1. Eye irritation Patient notes that he was awoken with irritation in his left eye about 4am.  He notes discomfort is located under the upper lid.  He notes that he rinsed his eye out with Visine and water with little improvement in symptoms.  He notes excessive tearing of the eye but no pain with ocular movement, loss of vision.  No trauma to the eye. No fevers.  He notes that he mowed grass but does not recall getting anything in his eye yesterday.  He is a former Building control surveyor.    ROS: Per HPI  Allergies  Allergen Reactions  . Penicillins Hives, Shortness Of Breath and Swelling   Past Medical History:  Diagnosis Date  . Allergy   . GERD (gastroesophageal reflux disease)   . Gout   . Hyperlipidemia   . Hypertension   . Internal hemorrhoid   . Nerve damage    left leg  . 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  allopurinol (ZYLOPRIM) 300 MG tablet, Take 1 tablet (300 mg total) by mouth daily., Disp: 30 tablet, Rfl: 6 .  amLODipine (NORVASC) 5 MG tablet, Take 1 tablet (5 mg total) by mouth daily., Disp: 90 tablet, Rfl: 3 .  aspirin 325 MG tablet, Take 325 mg by mouth daily., Disp: , Rfl:  .  atorvastatin (LIPITOR) 20 MG tablet, TAKE ONE TABLET BY MOUTH ONCE DAILY, Disp: 90 tablet, Rfl: 3 .  Cyanocobalamin (VITAMIN B-12 PO), Take 5,000 mcg by mouth daily., Disp: , Rfl:  .  HYDROcodone-acetaminophen (NORCO) 10-325 MG tablet, Take 1 tablet by mouth every 8 (eight) hours as needed., Disp: 30 tablet, Rfl: 0 .  lisinopril (PRINIVIL,ZESTRIL) 40 MG tablet, TAKE 1 TABLET BY MOUTH ONCE DAILY, Disp: 90 tablet, Rfl: 1 .  loratadine (CLARITIN) 10 MG tablet, Take 10 mg by mouth daily., Disp: , Rfl:  .  lovastatin (MEVACOR) 20 MG tablet, Take 1 tablet (20 mg total) by mouth at bedtime.,  Disp: 90 tablet, Rfl: 3 .  Multiple Vitamin (MULTIVITAMIN) tablet, Take 1 tablet by mouth daily., Disp: , Rfl:  .  omeprazole (PRILOSEC) 20 MG capsule, Take 20 mg by mouth daily., Disp: , Rfl:  Social History   Socioeconomic History  . Marital status: Married    Spouse name: Not on file  . Number of children: Not on file  . Years of education: Not on file  . Highest education level: Not on file  Occupational History  . Not on file  Social Needs  . Financial resource strain: Not on file  . Food insecurity:    Worry: Not on file    Inability: Not on file  . Transportation needs:    Medical: Not on file    Non-medical: Not on file  Tobacco Use  . Smoking status: Former Smoker    Last attempt to quit: 04/22/1988    Years since quitting: 29.6  . Smokeless tobacco: Former Systems developer    Types: [place] date: 06/22/2012  Substance and Sexual Activity  . Alcohol use: Yes    Alcohol/week: 10.5 oz    Types: 21 Standard drinks or equivalent per week  . Drug use: No  . Sexual activity: Not on file  Lifestyle  . Physical activity:  Days per week: Not on file    Minutes per session: Not on file  . Stress: Not on file  Relationships  . Social connections:    Talks on phone: Not on file    Gets together: Not on file    Attends religious service: Not on file    Active member of club or organization: Not on file    Attends meetings of clubs or organizations: Not on file    Relationship status: Not on file  . Intimate partner violence:    Fear of current or ex partner: Not on file    Emotionally abused: Not on file    Physically abused: Not on file    Forced sexual activity: Not on file  Other Topics Concern  . Not on file  Social History Narrative  . Not on file   Family History  Problem Relation Age of Onset  . Diabetes Father   . Colon cancer Neg Hx   . Stomach cancer Neg Hx   . Esophageal cancer Neg Hx   . Rectal cancer Neg Hx     Objective: Office vital signs  reviewed. BP (!) 158/92   Pulse 89   Temp 97.8 F (36.6 C) (Oral)   Ht 5\' 8"  (1.727 m)   Wt 240 lb (108.9 kg)   BMI 36.49 kg/m   Physical Examination:  General: Awake, alert, obese nourished, No acute distress HEENT: Normal    Neck: No masses palpated. No lymphadenopathy    Ears: Tympanic membranes intact, normal light reflex, no erythema, no bulging    Eyes: PERRLA, extraocular membranes intact, no pain w/ EOM. Sclera on left injected. Small pterygium w/in left medial eye,  Does not cross iris. Clear tears noted.  No foreign bodies appreciated.  No chalazion or lid lesions appreciated.  Visual acuity intact with use of his corrective lenses.   Fluorescein eye exam: 2 drops of tetracaine were applied to the eye.  Fluorescein stain was applied.  Black light was used to examine the eye.  No corneal abrasions or foreign bodies noted.  He has quite a bit of irritation in the inferior aspect of the sclera.  Assessment/ Plan: 57 y.o. male   1. Irritation of left eye No evidence of retained foreign body or corneal abrasion on fluorescein eye exam.  No red flag signs.  Doubt an acute angle glaucoma.  He definitely has residual irritation from what ever may have been in his eye.  He responded well to tetracaine eyedrops.  I recommended that he avoid rubbing his eyes if possible.  If he continues to have significant symptoms after tetracaine has worn off, I did recommend that he see his eye doctor for more close evaluation under slit-lamp.  He voiced good understanding and will follow-up as needed.   Janora Norlander, DO [facility] [phone]

## 2017-12-17 NOTE — Telephone Encounter (Signed)
I spoke to Dr Marin Comment.  She will see today.  Have patient go directly there.  His note from this am has been faxed to her office at [phone]

## 2017-12-17 NOTE — Telephone Encounter (Signed)
What do I tell Dr. Marin Comment?

## 2018-03-02 ENCOUNTER — Other Ambulatory Visit: Payer: Self-pay | Admitting: Physician Assistant

## 2018-04-13 ENCOUNTER — Other Ambulatory Visit: Payer: Self-pay | Admitting: Physician Assistant

## 2018-04-14 ENCOUNTER — Other Ambulatory Visit: Payer: Self-pay | Admitting: *Deleted

## 2018-04-14 MED ORDER — LISINOPRIL 40 MG PO TABS: 40.0000 mg | ORAL_TABLET | Freq: Every day | ORAL | 0 refills | Status: DC

## 2018-04-14 NOTE — Telephone Encounter (Signed)
Patient out of Lisinopril 40mg . Appointment made for this Friday 04/16/18 with Glenard Haring

## 2018-04-14 NOTE — Telephone Encounter (Signed)
Patient aware and was VERY rude to me stating all we wanted was his money. Phone call was transferred to Clinical Manager since patient was yelling at me.

## 2018-04-16 ENCOUNTER — Ambulatory Visit: Payer: Medicare Other | Admitting: Physician Assistant

## 2018-04-21 ENCOUNTER — Encounter: Payer: Self-pay | Admitting: Physician Assistant

## 2018-04-21 ENCOUNTER — Ambulatory Visit (INDEPENDENT_AMBULATORY_CARE_PROVIDER_SITE_OTHER): Payer: Medicare Other | Admitting: Physician Assistant

## 2018-04-21 VITALS — BP 139/81 | HR 85 | Temp 99.1°F | Ht 68.0 in | Wt 241.2 lb

## 2018-04-21 DIAGNOSIS — G5702 Lesion of sciatic nerve, left lower limb: Secondary | ICD-10-CM | POA: Diagnosis not present

## 2018-04-21 DIAGNOSIS — R0609 Other forms of dyspnea: Secondary | ICD-10-CM | POA: Diagnosis not present

## 2018-04-21 DIAGNOSIS — I10 Essential (primary) hypertension: Secondary | ICD-10-CM | POA: Diagnosis not present

## 2018-04-21 MED ORDER — ATORVASTATIN CALCIUM 20 MG PO TABS: 20.0000 mg | ORAL_TABLET | Freq: Every day | ORAL | 3 refills | Status: DC

## 2018-04-21 MED ORDER — AMLODIPINE BESYLATE 5 MG PO TABS: 5.0000 mg | ORAL_TABLET | Freq: Every day | ORAL | 3 refills | Status: DC

## 2018-04-21 MED ORDER — ALLOPURINOL 300 MG PO TABS: 300.0000 mg | ORAL_TABLET | Freq: Every day | ORAL | 3 refills | Status: DC

## 2018-04-21 MED ORDER — HYDROCODONE-ACETAMINOPHEN 10-325 MG PO TABS: 1.0000 | ORAL_TABLET | Freq: Three times a day (TID) | ORAL | 0 refills | Status: DC | PRN

## 2018-04-21 MED ORDER — LISINOPRIL 40 MG PO TABS: 40.0000 mg | ORAL_TABLET | Freq: Every day | ORAL | 3 refills | Status: DC

## 2018-04-26 NOTE — Progress Notes (Signed)
BP 139/81   Pulse 85   Temp 99.1 F (37.3 C) (Oral)   Ht 5' 8"  (1.727 m)   Wt 241 lb 3.2 oz (109.4 kg)   BMI 36.67 kg/m    Subjective:    Patient ID: Donald Butler, male    DOB: Feb 07, 1961, 57 y.o.   MRN: [id number]  HPI: Donald Butler is a 57 y.o. male presenting on 04/21/2018 for Hypertension and Hyperlipidemia  This patient comes in for periodic recheck on medications and conditions including hypertension, elevated cholesterol, peroneal nerve damage.  He reports overall he is doing well.  He does however complain of being more short of breath than he used to be when he is doing yard work or walking around his yard.  He is to be able to walk and move without any problem but he has become quite dyspneic.  He does work however in a short amount of time but he is concerned about this.  And he would be appropriate for him to have a cardiology work-up.  All medications are reviewed today. There are no reports of any problems with the medications. All of the medical conditions are reviewed and updated.  Lab work is reviewed and will be ordered as medically necessary. There are no new problems reported with today's visit.   Past Medical History:  Diagnosis Date  . Allergy   . GERD (gastroesophageal reflux disease)   . Gout   . Hyperlipidemia   . Hypertension   . Internal hemorrhoid   . Nerve damage    left leg  . 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 Interim medical history since our last visit reviewed. Allergies and medications reviewed and updated. DATA REVIEWED: CHART IN EPIC  Family History reviewed for pertinent findings.  Review of Systems  Constitutional: Positive for activity change and fatigue. Negative for appetite change.  Eyes: Negative for pain and visual disturbance.  Respiratory: Positive for shortness of breath. Negative for  cough, chest tightness and wheezing.   Cardiovascular: Negative.  Negative for palpitations and leg swelling.  Gastrointestinal: Negative.  Negative for abdominal pain, diarrhea, nausea and vomiting.  Genitourinary: Negative.   Musculoskeletal: Positive for arthralgias and gait problem.  Skin: Negative.  Negative for color change and rash.  Neurological: Negative for weakness, numbness and headaches.  Psychiatric/Behavioral: Negative.     Allergies as of 04/21/2018      Reactions   Penicillins Hives, Shortness Of Breath, Swelling      Medication List        Accurate as of 04/21/18 11:59 PM. Always use your most recent med list.          allopurinol 300 MG tablet Commonly known as:  ZYLOPRIM Take 1 tablet (300 mg total) by mouth daily.   amLODipine 5 MG tablet Commonly known as:  NORVASC Take 1 tablet (5 mg total) by mouth daily.   aspirin 325 MG tablet Take 325 mg by mouth daily.   atorvastatin 20 MG tablet Commonly known as:  LIPITOR Take 1 tablet (20 mg total) by mouth daily.   HYDROcodone-acetaminophen 10-325 MG tablet Commonly known as:  NORCO Take 1 tablet by mouth every 8 (eight) hours as needed.   lisinopril 40 MG tablet Commonly known as:  PRINIVIL,ZESTRIL Take 1 tablet (40 mg total) by mouth daily.   loratadine 10 MG tablet Commonly known  as:  CLARITIN Take 10 mg by mouth daily.   multivitamin tablet Take 1 tablet by mouth daily.   omeprazole 20 MG capsule Commonly known as:  PRILOSEC Take 20 mg by mouth daily.   VITAMIN B-12 PO Take 5,000 mcg by mouth daily.          Objective:    BP 139/81   Pulse 85   Temp 99.1 F (37.3 C) (Oral)   Ht 5' 8"  (1.727 m)   Wt 241 lb 3.2 oz (109.4 kg)   BMI 36.67 kg/m   Allergies  Allergen Reactions  . Penicillins Hives, Shortness Of Breath and Swelling    Wt Readings from Last 3 Encounters:  04/21/18 241 lb 3.2 oz (109.4 kg)  12/17/17 240 lb (108.9 kg)  12/01/17 240 lb (108.9 kg)    Physical Exam   Constitutional: He appears well-developed and well-nourished.  HENT:  Head: Normocephalic and atraumatic.  Eyes: Pupils are equal, round, and reactive to light. Conjunctivae and EOM are normal.  Neck: Normal range of motion. Neck supple.  Cardiovascular: Normal rate, regular rhythm and normal heart sounds.  Pulmonary/Chest: Effort normal and breath sounds normal.  Abdominal: Soft. Bowel sounds are normal.  Musculoskeletal: Normal range of motion.  Skin: Skin is warm and dry.    Results for orders placed or performed in visit on 05/29/16  Lipid panel  Result Value Ref Range   Cholesterol, Total 223 (H) 100 - 199 mg/dL   Triglycerides 127 0 - 149 mg/dL   HDL 59 >39 mg/dL   VLDL Cholesterol Cal 25 5 - 40 mg/dL   LDL Calculated 139 (H) 0 - 99 mg/dL   Chol/HDL Ratio 3.8 0.0 - 5.0 ratio units  CMP14+EGFR  Result Value Ref Range   Glucose 99 65 - 99 mg/dL   BUN 22 6 - 24 mg/dL   Creatinine, Ser 0.85 0.76 - 1.27 mg/dL   GFR calc non Af Amer 97 >59 mL/min/1.73   GFR calc Af Amer 113 >59 mL/min/1.73   BUN/Creatinine Ratio 26 (H) 9 - 20   Sodium 137 134 - 144 mmol/L   Potassium 4.8 3.5 - 5.2 mmol/L   Chloride 97 96 - 106 mmol/L   CO2 26 18 - 29 mmol/L   Calcium 9.6 8.7 - 10.2 mg/dL   Total Protein 7.1 6.0 - 8.5 g/dL   Albumin 4.8 3.5 - 5.5 g/dL   Globulin, Total 2.3 1.5 - 4.5 g/dL   Albumin/Globulin Ratio 2.1 1.2 - 2.2   Bilirubin Total 0.5 0.0 - 1.2 mg/dL   Alkaline Phosphatase 62 39 - 117 IU/L   AST 23 0 - 40 IU/L   ALT 20 0 - 44 IU/L  CBC with Differential  Result Value Ref Range   WBC 6.8 3.4 - 10.8 x10E3/uL   RBC 4.64 4.14 - 5.80 x10E6/uL   Hemoglobin 15.3 12.6 - 17.7 g/dL   Hematocrit 44.4 37.5 - 51.0 %   MCV 96 79 - 97 fL   MCH 33.0 26.6 - 33.0 pg   MCHC 34.5 31.5 - 35.7 g/dL   RDW 13.1 12.3 - 15.4 %   Platelets 228 150 - 379 x10E3/uL   Neutrophils 55 Not Estab. %   Lymphs 31 Not Estab. %   Monocytes 10 Not Estab. %   Eos 3 Not Estab. %   Basos 1 Not Estab. %    Neutrophils Absolute 3.7 1.4 - 7.0 x10E3/uL   Lymphocytes Absolute 2.1 0.7 - 3.1 x10E3/uL  Monocytes Absolute 0.7 0.1 - 0.9 x10E3/uL   EOS (ABSOLUTE) 0.2 0.0 - 0.4 x10E3/uL   Basophils Absolute 0.0 0.0 - 0.2 x10E3/uL   Immature Granulocytes 0 Not Estab. %   Immature Grans (Abs) 0.0 0.0 - 0.1 x10E3/uL  TSH  Result Value Ref Range   TSH 1.840 0.450 - 4.500 uIU/mL      Assessment & Plan:   1. Compression of common peroneal nerve, left - HYDROcodone-acetaminophen (NORCO) 10-325 MG tablet; Take 1 tablet by mouth every 8 (eight) hours as needed.  Dispense: 30 tablet; Refill: 0  2. Essential hypertension - amLODipine (NORVASC) 5 MG tablet; Take 1 tablet (5 mg total) by mouth daily.  Dispense: 90 tablet; Refill: 3 - Ambulatory referral to Cardiology  3. Dyspnea on exertion Report to ED or call 911 if any worsening chestpain - Ambulatory referral to Cardiology   Continue all other maintenance medications as listed above.  Follow up plan: Return in about 2 weeks (around 05/05/2018) for CPE and labs.  Educational handout given for [employer] PA-C [place] [address]  [place], [place] [address] [phone]   04/26/2018, 8:34 AM

## 2018-05-13 ENCOUNTER — Encounter: Payer: Self-pay | Admitting: Physician Assistant

## 2018-05-13 ENCOUNTER — Ambulatory Visit (INDEPENDENT_AMBULATORY_CARE_PROVIDER_SITE_OTHER): Payer: Medicare Other | Admitting: Physician Assistant

## 2018-05-13 VITALS — BP 143/88 | HR 71 | Temp 99.4°F | Ht 68.0 in | Wt 241.0 lb

## 2018-05-13 DIAGNOSIS — R1032 Left lower quadrant pain: Secondary | ICD-10-CM | POA: Diagnosis not present

## 2018-05-13 DIAGNOSIS — I10 Essential (primary) hypertension: Secondary | ICD-10-CM | POA: Diagnosis not present

## 2018-05-13 DIAGNOSIS — K5792 Diverticulitis of intestine, part unspecified, without perforation or abscess without bleeding: Secondary | ICD-10-CM | POA: Diagnosis not present

## 2018-05-13 DIAGNOSIS — Z125 Encounter for screening for malignant neoplasm of prostate: Secondary | ICD-10-CM | POA: Diagnosis not present

## 2018-05-13 DIAGNOSIS — R140 Abdominal distension (gaseous): Secondary | ICD-10-CM

## 2018-05-13 DIAGNOSIS — K5790 Diverticulosis of intestine, part unspecified, without perforation or abscess without bleeding: Secondary | ICD-10-CM | POA: Insufficient documentation

## 2018-05-13 DIAGNOSIS — E785 Hyperlipidemia, unspecified: Secondary | ICD-10-CM | POA: Diagnosis not present

## 2018-05-13 DIAGNOSIS — Z0000 Encounter for general adult medical examination without abnormal findings: Secondary | ICD-10-CM

## 2018-05-13 MED ORDER — METRONIDAZOLE 500 MG PO TABS: 500.0000 mg | ORAL_TABLET | Freq: Two times a day (BID) | ORAL | 0 refills | Status: DC

## 2018-05-13 MED ORDER — CIPROFLOXACIN HCL 500 MG PO TABS: 500.0000 mg | ORAL_TABLET | Freq: Two times a day (BID) | ORAL | 0 refills | Status: DC

## 2018-05-13 NOTE — Progress Notes (Signed)
BP (!) 143/88   Pulse 71   Temp 99.4 F (37.4 C) (Oral)   Ht _0  (1.727 m)   Wt 241 lb (109.3 kg)   BMI 36.64 kg/m    Subjective:    Patient ID: Donald Butler, male    DOB: 1960-10-26, 57 y.o.   MRN: [id number]  HPI: Donald Butler is a 57 y.o. male presenting on 05/13/2018 for No chief complaint on file.  Patient comes in for his annual well exam.  He is still taking the same medications.  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  He states he is using minimal cane at this time.  He is able to weight-bear very well on his own.  The patient has a very significant abdominal pain, distention, GI distress.  He states he has diarrhea 7-8 times per day.  Most of the time is very liquid and the colors will be what ever he has eaten.  He states even if he drinks water he will have significant flatulence and eradication.  He denies any vomiting.  He has had no weight loss.  However he does have a very distended abdomen compared to normal.  He does get winded when he walks for very far.  There is a cardiology appointment scheduled for him in the coming weeks.  He is currently worried about another diverticulitis infection and something else going on in his upper abdomen.  It is very appropriate to have a CT ordered for this is of high concern that there is some type of mass-effect happening.  Past Medical History:  Diagnosis Date  . Allergy   . GERD (gastroesophageal reflux disease)   . Gout   . Hyperlipidemia   . Hypertension   . Internal hemorrhoid   . Nerve damage    left leg  . 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 Interim medical history since our last visit reviewed. Allergies and medications reviewed and updated. DATA REVIEWED: CHART IN EPIC  Family History reviewed for pertinent  findings.  Review of Systems  Constitutional: Positive for activity change, fatigue and unexpected weight change. Negative for appetite change and fever.  HENT: Negative.   Eyes: Negative.  Negative for pain and visual disturbance.  Respiratory: Positive for shortness of breath. Negative for cough, chest tightness and wheezing.   Cardiovascular: Positive for chest pain. Negative for palpitations and leg swelling.  Gastrointestinal: Positive for abdominal distention, abdominal pain, blood in stool, diarrhea, nausea, rectal pain and vomiting.  Endocrine: Negative.   Genitourinary: Negative.   Musculoskeletal: Positive for arthralgias.  Skin: Negative.  Negative for color change and rash.  Neurological: Negative.  Negative for weakness, numbness and headaches.  Psychiatric/Behavioral: Negative.     Allergies as of 05/13/2018      Reactions   Penicillins Hives, Shortness Of Breath, Swelling      Medication List        Accurate as of 05/13/18  4:10 PM. Always use your most recent med list.          allopurinol 300 MG tablet Commonly known as:  ZYLOPRIM Take 1 tablet (300 mg total) by mouth daily.   amLODipine 5 MG tablet Commonly known as:  NORVASC Take 1 tablet (5 mg total) by mouth daily.   aspirin 325 MG tablet Take 325  mg by mouth daily.   atorvastatin 20 MG tablet Commonly known as:  LIPITOR Take 1 tablet (20 mg total) by mouth daily.   ciprofloxacin 500 MG tablet Commonly known as:  CIPRO Take 1 tablet (500 mg total) by mouth 2 (two) times daily.   HYDROcodone-acetaminophen 10-325 MG tablet Commonly known as:  NORCO Take 1 tablet by mouth every 8 (eight) hours as needed.   lisinopril 40 MG tablet Commonly known as:  PRINIVIL,ZESTRIL Take 1 tablet (40 mg total) by mouth daily.   loratadine 10 MG tablet Commonly known as:  CLARITIN Take 10 mg by mouth daily.   metroNIDAZOLE 500 MG tablet Commonly known as:  FLAGYL Take 1 tablet (500 mg total) by mouth 2  (two) times daily.   multivitamin tablet Take 1 tablet by mouth daily.   omeprazole 20 MG capsule Commonly known as:  PRILOSEC Take 20 mg by mouth daily.   VITAMIN B-12 PO Take 5,000 mcg by mouth daily.          Objective:    BP (!) 143/88   Pulse 71   Temp 99.4 F (37.4 C) (Oral)   Ht _0  (1.727 m)   Wt 241 lb (109.3 kg)   BMI 36.64 kg/m   Allergies  Allergen Reactions  . Penicillins Hives, Shortness Of Breath and Swelling    Wt Readings from Last 3 Encounters:  05/13/18 241 lb (109.3 kg)  04/21/18 241 lb 3.2 oz (109.4 kg)  12/17/17 240 lb (108.9 kg)    Physical Exam  Constitutional: He appears well-developed and well-nourished.  HENT:  Head: Normocephalic and atraumatic.  Eyes: Pupils are equal, round, and reactive to light. Conjunctivae and EOM are normal.  Neck: Normal range of motion. Neck supple.  Cardiovascular: Normal rate, regular rhythm and normal heart sounds.  Pulmonary/Chest: Effort normal and breath sounds normal.  Abdominal: Soft. Bowel sounds are normal. He exhibits distension. He exhibits no pulsatile liver and no pulsatile midline mass. There is generalized tenderness and tenderness in the epigastric area. There is no rigidity, no rebound and no guarding.    Musculoskeletal: Normal range of motion.  Skin: Skin is warm and dry.    Results for orders placed or performed in visit on 05/29/16  Lipid panel  Result Value Ref Range   Cholesterol, Total 223 (H) 100 - 199 mg/dL   Triglycerides 127 0 - 149 mg/dL   HDL 59 >39 mg/dL   VLDL Cholesterol Cal 25 5 - 40 mg/dL   LDL Calculated 139 (H) 0 - 99 mg/dL   Chol/HDL Ratio 3.8 0.0 - 5.0 ratio units  CMP14+EGFR  Result Value Ref Range   Glucose 99 65 - 99 mg/dL   BUN 22 6 - 24 mg/dL   Creatinine, Ser 0.85 0.76 - 1.27 mg/dL   GFR calc non Af Amer 97 >59 mL/min/1.73   GFR calc Af Amer 113 >59 mL/min/1.73   BUN/Creatinine Ratio 26 (H) 9 - 20   Sodium 137 134 - 144 mmol/L   Potassium 4.8 3.5 -  5.2 mmol/L   Chloride 97 96 - 106 mmol/L   CO2 26 18 - 29 mmol/L   Calcium 9.6 8.7 - 10.2 mg/dL   Total Protein 7.1 6.0 - 8.5 g/dL   Albumin 4.8 3.5 - 5.5 g/dL   Globulin, Total 2.3 1.5 - 4.5 g/dL   Albumin/Globulin Ratio 2.1 1.2 - 2.2   Bilirubin Total 0.5 0.0 - 1.2 mg/dL   Alkaline Phosphatase 62 39 - 117  IU/L   AST 23 0 - 40 IU/L   ALT 20 0 - 44 IU/L  CBC with Differential  Result Value Ref Range   WBC 6.8 3.4 - 10.8 x10E3/uL   RBC 4.64 4.14 - 5.80 x10E6/uL   Hemoglobin 15.3 12.6 - 17.7 g/dL   Hematocrit 44.4 37.5 - 51.0 %   MCV 96 79 - 97 fL   MCH 33.0 26.6 - 33.0 pg   MCHC 34.5 31.5 - 35.7 g/dL   RDW 13.1 12.3 - 15.4 %   Platelets 228 150 - 379 x10E3/uL   Neutrophils 55 Not Estab. %   Lymphs 31 Not Estab. %   Monocytes 10 Not Estab. %   Eos 3 Not Estab. %   Basos 1 Not Estab. %   Neutrophils Absolute 3.7 1.4 - 7.0 x10E3/uL   Lymphocytes Absolute 2.1 0.7 - 3.1 x10E3/uL   Monocytes Absolute 0.7 0.1 - 0.9 x10E3/uL   EOS (ABSOLUTE) 0.2 0.0 - 0.4 x10E3/uL   Basophils Absolute 0.0 0.0 - 0.2 x10E3/uL   Immature Granulocytes 0 Not Estab. %   Immature Grans (Abs) 0.0 0.0 - 0.1 x10E3/uL  TSH  Result Value Ref Range   TSH 1.840 0.450 - 4.500 uIU/mL      Assessment & Plan:   1. Abdominal distension (gaseous) - CT ABDOMEN PELVIS W WO CONTRAST; Future - CBC with Differential/Platelet  2. Left lower quadrant pain - CT ABDOMEN PELVIS W WO CONTRAST; Future - CBC with Differential/Platelet  3. Well adult exam - CBC with Differential/Platelet - CMP14+EGFR - Lipid panel - Thyroid Panel With TSH - PSA  4. Diverticulitis - metroNIDAZOLE (FLAGYL) 500 MG tablet; Take 1 tablet (500 mg total) by mouth 2 (two) times daily.  Dispense: 20 tablet; Refill: 0 - ciprofloxacin (CIPRO) 500 MG tablet; Take 1 tablet (500 mg total) by mouth 2 (two) times daily.  Dispense: 20 tablet; Refill: 0   Continue all other maintenance medications as listed above.  Follow up plan: No follow-ups  on file.  Educational handout given for [place] PA-C [place] [address]  [place], [place] [address] [phone]   05/13/2018, 4:10 PM

## 2018-05-14 LAB — CBC WITH DIFFERENTIAL/PLATELET
BASOS ABS: 0.1 10*3/uL (ref 0.0–0.2)
Basos: 1 %
EOS (ABSOLUTE): 0.3 10*3/uL (ref 0.0–0.4)
Eos: 4 %
Hematocrit: 44.1 % (ref 37.5–51.0)
Hemoglobin: 15.4 g/dL (ref 13.0–17.7)
IMMATURE GRANULOCYTES: 0 %
Immature Grans (Abs): 0 10*3/uL (ref 0.0–0.1)
Lymphocytes Absolute: 2 10*3/uL (ref 0.7–3.1)
Lymphs: 26 %
MCH: 32.8 pg (ref 26.6–33.0)
MCHC: 34.9 g/dL (ref 31.5–35.7)
MCV: 94 fL (ref 79–97)
MONOS ABS: 0.7 10*3/uL (ref 0.1–0.9)
Monocytes: 9 %
NEUTROS PCT: 60 %
Neutrophils Absolute: 4.6 10*3/uL (ref 1.4–7.0)
PLATELETS: 215 10*3/uL (ref 150–450)
RBC: 4.69 x10E6/uL (ref 4.14–5.80)
RDW: 12 % — AB (ref 12.3–15.4)
WBC: 7.5 10*3/uL (ref 3.4–10.8)

## 2018-05-14 LAB — CMP14+EGFR
ALT: 38 IU/L (ref 0–44)
AST: 35 IU/L (ref 0–40)
Albumin/Globulin Ratio: 1.9 (ref 1.2–2.2)
Albumin: 4.4 g/dL (ref 3.5–5.5)
Alkaline Phosphatase: 70 IU/L (ref 39–117)
BUN/Creatinine Ratio: 23 — ABNORMAL HIGH (ref 9–20)
BUN: 18 mg/dL (ref 6–24)
Bilirubin Total: 0.4 mg/dL (ref 0.0–1.2)
CALCIUM: 9.2 mg/dL (ref 8.7–10.2)
CO2: 22 mmol/L (ref 20–29)
CREATININE: 0.8 mg/dL (ref 0.76–1.27)
Chloride: 102 mmol/L (ref 96–106)
GFR calc Af Amer: 114 mL/min/{1.73_m2} (ref >59)
GFR, EST NON AFRICAN AMERICAN: 98 mL/min/{1.73_m2} (ref >59)
GLUCOSE: 108 mg/dL — AB (ref 65–99)
Globulin, Total: 2.3 g/dL (ref 1.5–4.5)
Potassium: 4.2 mmol/L (ref 3.5–5.2)
Sodium: 139 mmol/L (ref 134–144)
Total Protein: 6.7 g/dL (ref 6.0–8.5)

## 2018-05-14 LAB — LIPID PANEL
CHOL/HDL RATIO: 3.3 ratio (ref 0.0–5.0)
Cholesterol, Total: 158 mg/dL (ref 100–199)
HDL: 48 mg/dL (ref >39)
LDL CALC: 53 mg/dL (ref 0–99)
Triglycerides: 287 mg/dL — ABNORMAL HIGH (ref 0–149)
VLDL Cholesterol Cal: 57 mg/dL — ABNORMAL HIGH (ref 5–40)

## 2018-05-14 LAB — THYROID PANEL WITH TSH
Free Thyroxine Index: 1.6 (ref 1.2–4.9)
T3 Uptake Ratio: 26 % (ref 24–39)
T4, Total: 6.1 ug/dL (ref 4.5–12.0)
TSH: 2.01 u[IU]/mL (ref 0.450–4.500)

## 2018-05-14 LAB — PSA: Prostate Specific Ag, Serum: 0.6 ng/mL (ref 0.0–4.0)

## 2018-05-17 ENCOUNTER — Other Ambulatory Visit: Payer: Self-pay

## 2018-05-17 ENCOUNTER — Telehealth: Payer: Self-pay | Admitting: Physician Assistant

## 2018-05-17 ENCOUNTER — Other Ambulatory Visit: Payer: Self-pay | Admitting: Physician Assistant

## 2018-05-17 DIAGNOSIS — R109 Unspecified abdominal pain: Secondary | ICD-10-CM

## 2018-05-17 MED ORDER — COLCHICINE 0.6 MG PO TABS: 0.6000 mg | ORAL_TABLET | Freq: Every day | ORAL | 0 refills | Status: DC

## 2018-05-17 NOTE — Telephone Encounter (Signed)
Patient states that he is having a flare of gout in his foot this weekend and is having trouble walking. Patient wants to know if you can send in [facility] for him if it will not interfere with the antibiotics he his on for diverticulitis.

## 2018-05-17 NOTE — Telephone Encounter (Signed)
It has been found that Uloric is not good for the heart, preferences are colchicine or allopurinol

## 2018-05-17 NOTE — Addendum Note (Signed)
Addended by: Terald Sleeper on: 05/17/2018 01:52 PM   Modules accepted: Level of Service

## 2018-05-17 NOTE — Telephone Encounter (Signed)
Colchicine please

## 2018-05-18 ENCOUNTER — Ambulatory Visit (HOSPITAL_COMMUNITY)
Admission: RE | Admit: 2018-05-18 | Discharge: 2018-05-18 | Disposition: A | Discharge status: Home / Self Care | Payer: Medicare Other | Source: Ambulatory Visit | Attending: Physician Assistant | Admitting: Physician Assistant

## 2018-05-18 DIAGNOSIS — K760 Fatty (change of) liver, not elsewhere classified: Secondary | ICD-10-CM | POA: Insufficient documentation

## 2018-05-18 DIAGNOSIS — R109 Unspecified abdominal pain: Secondary | ICD-10-CM

## 2018-05-18 MED ORDER — IOPAMIDOL (ISOVUE-300) INJECTION 61%
100.0000 mL | Freq: Once | INTRAVENOUS | Status: AC | PRN
  Administered 2018-05-18: 100 mL via INTRAVENOUS

## 2018-06-03 ENCOUNTER — Ambulatory Visit (INDEPENDENT_AMBULATORY_CARE_PROVIDER_SITE_OTHER): Payer: Medicare Other | Admitting: Cardiovascular Disease

## 2018-06-03 ENCOUNTER — Encounter: Payer: Self-pay | Admitting: *Deleted

## 2018-06-03 ENCOUNTER — Encounter: Payer: Self-pay | Admitting: Cardiovascular Disease

## 2018-06-03 ENCOUNTER — Telehealth: Payer: Self-pay | Admitting: Cardiovascular Disease

## 2018-06-03 ENCOUNTER — Ambulatory Visit (HOSPITAL_COMMUNITY): Payer: Medicare Other

## 2018-06-03 VITALS — BP 160/96 | HR 75 | Ht 68.0 in | Wt 241.8 lb

## 2018-06-03 DIAGNOSIS — R5383 Other fatigue: Secondary | ICD-10-CM | POA: Diagnosis not present

## 2018-06-03 DIAGNOSIS — Z01812 Encounter for preprocedural laboratory examination: Secondary | ICD-10-CM | POA: Diagnosis not present

## 2018-06-03 DIAGNOSIS — E782 Mixed hyperlipidemia: Secondary | ICD-10-CM

## 2018-06-03 DIAGNOSIS — R079 Chest pain, unspecified: Secondary | ICD-10-CM | POA: Diagnosis not present

## 2018-06-03 DIAGNOSIS — I10 Essential (primary) hypertension: Secondary | ICD-10-CM | POA: Diagnosis not present

## 2018-06-03 DIAGNOSIS — R0609 Other forms of dyspnea: Secondary | ICD-10-CM

## 2018-06-03 MED ORDER — METOPROLOL TARTRATE 50 MG PO TABS: 50.0000 mg | ORAL_TABLET | Freq: Once | ORAL | 0 refills | Status: DC

## 2018-06-03 NOTE — Progress Notes (Signed)
CARDIOLOGY CONSULT NOTE  Patient ID: JAHSIR RAMA MRN: [id number] DOB/AGE: Jun 11, 1961 57 y.o.  Admit date: (Not on file) Primary Physician: Terald Sleeper, PA-C Referring Physician: Terald Sleeper, PA-C  Reason for Consultation: Exertional dyspnea and hypertension  HPI: Donald Butler is a 58 y.o. male who is being seen today for the evaluation of exertional dyspnea and hypertension at the request of Terald Sleeper, PA-C.   Past medical history includes hyperlipidemia and left common peroneal nerve injury.  I reviewed an abdominal CT performed on 05/18/2018 which demonstrated aortic atherosclerosis and fatty liver.  Labs performed on 05/13/2018 were notable for hypertriglyceridemia.  Renal function, hemoglobin, and thyroid function were all normal.  ECG performed in the office today which I ordered and personally interpreted demonstrates normal sinus rhythm with no ischemic ST segment or T-wave abnormalities, nor any arrhythmias.  He tells me that he has had significant problems with acid reflux and has chest pains related to this alleviated with belching.  He takes Prilosec.  He has had some left shoulder pains which are aggravated with certain movements.  Since 2019 began, he has noticed a decline in energy levels with increasing exertional fatigue and profuse sweating with any significant activity.  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  He had been experiencing some abdominal pain and was diagnosed with diverticulitis for which he took a 10-day antibiotic course which he completed about a week and a half ago and symptoms have significant improved.   Allergies  Allergen Reactions  . Penicillins Hives, Shortness Of Breath and Swelling    Current Outpatient Medications  Medication Sig Dispense Refill  . allopurinol (ZYLOPRIM) 300 MG tablet Take 1 tablet (300 mg total) by mouth daily. 90 tablet 3    . amLODipine (NORVASC) 5 MG tablet Take 1 tablet (5 mg total) by mouth daily. 90 tablet 3  . aspirin 325 MG tablet Take 325 mg by mouth daily.    Marland Kitchen atorvastatin (LIPITOR) 20 MG tablet Take 1 tablet (20 mg total) by mouth daily. 90 tablet 3  . colchicine 0.6 MG tablet Take 1 tablet (0.6 mg total) by mouth daily. (Patient taking differently: Take 0.6 mg by mouth daily as needed. ) 30 tablet 0  . Cyanocobalamin (VITAMIN B-12 PO) Take 5,000 mcg by mouth daily.    Marland Kitchen HYDROcodone-acetaminophen (NORCO) 10-325 MG tablet Take 1 tablet by mouth every 8 (eight) hours as needed. 30 tablet 0  . lisinopril (PRINIVIL,ZESTRIL) 40 MG tablet Take 1 tablet (40 mg total) by mouth daily. 90 tablet 3  . loratadine (CLARITIN) 10 MG tablet Take 10 mg by mouth daily.    . Multiple Vitamin (MULTIVITAMIN) tablet Take 1 tablet by mouth daily.    Marland Kitchen omeprazole (PRILOSEC) 20 MG capsule Take 20 mg by mouth daily.     No current facility-administered medications for this visit.     Past Medical History:  Diagnosis Date  . Allergy   . GERD (gastroesophageal reflux disease)   . Gout   . Hyperlipidemia   . Hypertension   . Internal hemorrhoid   . Nerve damage    left leg  . 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 [place]   ruptured disk  x 2  .  CARPAL TUNNEL RELEASE Left   . COLONOSCOPY    . EXTERNAL EAR SURGERY    . KNEE ARTHROSCOPY  2006, 2008   x 2  left  . MASTOIDECTOMY  2013   right  . OPEN ANTERIOR SHOULDER RECONSTRUCTION  1970   right  . POLYPECTOMY    . reconstruction left leg  2009   multiple surgery after accident    Social History   Socioeconomic History  . Marital status: Married    Spouse name: Not on file  . Number of children: Not on file  . Years of education: Not on file  . Highest education level: Not on file  Occupational History  . Not on file  Social Needs  .  Financial resource strain: Not on file  . Food insecurity:    Worry: Not on file    Inability: Not on file  . Transportation needs:    Medical: Not on file    Non-medical: Not on file  Tobacco Use  . Smoking status: Former Smoker    Last attempt to quit: 04/22/1988    Years since quitting: 30.1  . Smokeless tobacco: Former Systems developer    Types: [place] date: 06/22/2012  Substance and Sexual Activity  . Alcohol use: Yes    Alcohol/week: 21.0 standard drinks    Types: 21 Standard drinks or equivalent per week  . Drug use: No  . Sexual activity: Not on file  Lifestyle  . Physical activity:    Days per week: Not on file    Minutes per session: Not on file  . Stress: Not on file  Relationships  . Social connections:    Talks on phone: Not on file    Gets together: Not on file    Attends religious service: Not on file    Active member of club or organization: Not on file    Attends meetings of clubs or organizations: Not on file    Relationship status: Not on file  . Intimate partner violence:    Fear of current or ex partner: Not on file    Emotionally abused: Not on file    Physically abused: Not on file    Forced sexual activity: Not on file  Other Topics Concern  . Not on file  Social History Narrative  . Not on file     Family history: He is adopted.  Current Meds  Medication Sig  . allopurinol (ZYLOPRIM) 300 MG tablet Take 1 tablet (300 mg total) by mouth daily.  Marland Kitchen amLODipine (NORVASC) 5 MG tablet Take 1 tablet (5 mg total) by mouth daily.  Marland Kitchen aspirin 325 MG tablet Take 325 mg by mouth daily.  Marland Kitchen atorvastatin (LIPITOR) 20 MG tablet Take 1 tablet (20 mg total) by mouth daily.  . colchicine 0.6 MG tablet Take 1 tablet (0.6 mg total) by mouth daily. (Patient taking differently: Take 0.6 mg by mouth daily as needed. )  . Cyanocobalamin (VITAMIN B-12 PO) Take 5,000 mcg by mouth daily.  Marland Kitchen HYDROcodone-acetaminophen (NORCO) 10-325 MG tablet Take 1 tablet by mouth every 8  (eight) hours as needed.  Marland Kitchen lisinopril (PRINIVIL,ZESTRIL) 40 MG tablet Take 1 tablet (40 mg total) by mouth daily.  Marland Kitchen loratadine (CLARITIN) 10 MG tablet Take 10 mg by mouth daily.  . Multiple Vitamin (MULTIVITAMIN) tablet Take 1 tablet by mouth daily.  Marland Kitchen omeprazole (PRILOSEC) 20 MG capsule Take 20 mg by mouth daily.      Review of systems complete  and found to be negative unless listed above in HPI    Physical exam Blood pressure (!) 160/96, pulse 75, height 5\' 8"  (1.727 m), weight 241 lb 12.8 oz (109.7 kg), SpO2 97 %. General: NAD Neck: No JVD, no thyromegaly or thyroid nodule.  Lungs: Clear to auscultation bilaterally with normal respiratory effort. CV: Nondisplaced PMI. Regular rate and rhythm, normal S1/S2, no S3/S4, no murmur.  No peripheral edema.  No carotid bruit.    Abdomen: Soft, nontender, obese.  Skin: Intact without lesions or rashes.  Neurologic: Alert and oriented x 3.  Psych: Normal affect. Extremities: No clubbing or cyanosis.  HEENT: Normal.   ECG: Most recent ECG reviewed.   Labs: Lab Results  Component Value Date/Time   K 4.2 05/13/2018 04:10 PM   BUN 18 05/13/2018 04:10 PM   CREATININE 0.80 05/13/2018 04:10 PM   ALT 38 05/13/2018 04:10 PM   TSH 2.010 05/13/2018 04:10 PM   HGB 15.4 05/13/2018 04:10 PM     Lipids: Lab Results  Component Value Date/Time   LDLCALC 53 05/13/2018 04:10 PM   CHOL 158 05/13/2018 04:10 PM   TRIG 287 (H) 05/13/2018 04:10 PM   HDL 48 05/13/2018 04:10 PM        ASSESSMENT AND PLAN:   1.  Exertional dyspnea and fatigue with chest pain: He has had a significant decline in energy levels over the past year.  He has multiple cardiovascular risk factors. I will proceed with coronary CT angiography to assess for significant coronary artery disease.  I will order a 2-D echocardiogram with Doppler to evaluate cardiac structure, function, and regional wall motion.  2.  Hypertension: Blood pressure is elevated.  He takes amlodipine  5 mg daily and lisinopril 40 mg.  It was mildly elevated at PCP office visit on 05/13/2018 as well.  It was borderline on 04/21/2018.  If it remains elevated, I would increase amlodipine to 10 mg.  3.  Mixed dyslipidemia: Lipids reviewed above.  Continue atorvastatin 20 mg.    Disposition: Follow up in 3 months  Signed: Kate Sable, M.D., F.A.C.C.  06/03/2018, 9:16 AM

## 2018-06-03 NOTE — Patient Instructions (Signed)
Medication Instructions:  Continue all current medications.  Labwork:  BMET - order given today.  Do just prior to CT.  Testing/Procedures:  Your physician has requested that you have an echocardiogram. Echocardiography is a painless test that uses sound waves to create images of your heart. It provides your doctor with information about the size and shape of your heart and how well your heart's chambers and valves are working. This procedure takes approximately one hour. There are no restrictions for this procedure.  Coronary CT  Office will contact with results via phone or letter.    Follow-Up: 3 months   Any Other Special Instructions Will Be Listed Below (If Applicable).  If you need a refill on your cardiac medications before your next appointment, please call your pharmacy.

## 2018-06-03 NOTE — Telephone Encounter (Signed)
Pre-cert Verification for the following procedure   Echo scheduled for 06-17-18

## 2018-06-11 ENCOUNTER — Other Ambulatory Visit: Payer: Self-pay | Admitting: *Deleted

## 2018-06-11 DIAGNOSIS — I209 Angina pectoris, unspecified: Secondary | ICD-10-CM

## 2018-06-17 ENCOUNTER — Ambulatory Visit (INDEPENDENT_AMBULATORY_CARE_PROVIDER_SITE_OTHER): Payer: Medicare Other

## 2018-06-17 ENCOUNTER — Encounter

## 2018-06-17 ENCOUNTER — Other Ambulatory Visit: Payer: Self-pay

## 2018-06-17 DIAGNOSIS — R0609 Other forms of dyspnea: Secondary | ICD-10-CM

## 2018-06-17 DIAGNOSIS — R079 Chest pain, unspecified: Secondary | ICD-10-CM

## 2018-06-23 ENCOUNTER — Encounter (HOSPITAL_COMMUNITY): Payer: Self-pay

## 2018-06-23 ENCOUNTER — Ambulatory Visit (HOSPITAL_COMMUNITY)
Admission: RE | Admit: 2018-06-23 | Discharge: 2018-06-23 | Disposition: A | Discharge status: Home / Self Care | Payer: Medicare Other | Source: Ambulatory Visit | Attending: Cardiovascular Disease | Admitting: Cardiovascular Disease

## 2018-06-23 DIAGNOSIS — I25119 Atherosclerotic heart disease of native coronary artery with unspecified angina pectoris: Secondary | ICD-10-CM | POA: Insufficient documentation

## 2018-06-23 DIAGNOSIS — R918 Other nonspecific abnormal finding of lung field: Secondary | ICD-10-CM | POA: Insufficient documentation

## 2018-06-23 DIAGNOSIS — I700 Atherosclerosis of aorta: Secondary | ICD-10-CM | POA: Diagnosis not present

## 2018-06-23 DIAGNOSIS — K760 Fatty (change of) liver, not elsewhere classified: Secondary | ICD-10-CM | POA: Insufficient documentation

## 2018-06-23 DIAGNOSIS — I209 Angina pectoris, unspecified: Secondary | ICD-10-CM

## 2018-06-23 MED ORDER — NITROGLYCERIN 0.4 MG SL SUBL
SUBLINGUAL_TABLET | SUBLINGUAL | Status: AC
  Filled 2018-06-23: qty 2

## 2018-06-23 MED ORDER — NITROGLYCERIN 0.4 MG SL SUBL
0.8000 mg | SUBLINGUAL_TABLET | Freq: Once | SUBLINGUAL | Status: AC
  Administered 2018-06-23: 0.8 mg via SUBLINGUAL
  Filled 2018-06-23: qty 25

## 2018-06-23 MED ORDER — METOPROLOL TARTRATE 5 MG/5ML IV SOLN
INTRAVENOUS | Status: AC
  Filled 2018-06-23: qty 20

## 2018-06-23 MED ORDER — IOPAMIDOL (ISOVUE-370) INJECTION 76%
100.0000 mL | Freq: Once | INTRAVENOUS | Status: AC | PRN
  Administered 2018-06-23: 100 mL via INTRAVENOUS

## 2018-06-23 MED ORDER — METOPROLOL TARTRATE 5 MG/5ML IV SOLN
5.0000 mg | INTRAVENOUS | Status: DC | PRN
  Administered 2018-06-23 (×2): 5 mg via INTRAVENOUS
  Filled 2018-06-23 (×2): qty 5

## 2018-06-24 DIAGNOSIS — I209 Angina pectoris, unspecified: Secondary | ICD-10-CM

## 2018-06-30 ENCOUNTER — Telehealth: Payer: Self-pay | Admitting: *Deleted

## 2018-06-30 NOTE — Telephone Encounter (Signed)
Notes recorded by Laurine Blazer, LPN on 67/42/5525 at 2:42 PM EST Patient notified. Copy to pmd. Follow up scheduled for 09/06/18 with Dr. Bronson Ing.   ------  Notes recorded by Laurine Blazer, LPN on 89/48/3475 at 9:02 AM EST Left message for return call with son Cheri Rous). Stated he is out of town, but will be back later this evening. ------  Notes recorded by Laurine Blazer, LPN on 83/0/7460 at 02:98 AM EST Unable to leave message. VM full. ------  Notes recorded by Herminio Commons, MD on 06/21/2018 at 4:24 PM EST This study demonstrates: Normal pumping function. Mild mitral valve leakage. Medication changes / Follow up studies / Other recommendations:  None. Please send results to the PCP: Terald Sleeper, PA-C  Kate Sable, MD 06/21/2018 4:23 PM

## 2018-07-06 ENCOUNTER — Other Ambulatory Visit: Payer: Self-pay | Admitting: Physician Assistant

## 2018-07-06 MED ORDER — ALBUTEROL SULFATE HFA 108 (90 BASE) MCG/ACT IN AERS: 2.0000 | INHALATION_SPRAY | Freq: Four times a day (QID) | RESPIRATORY_TRACT | 2 refills | Status: DC | PRN

## 2018-08-09 ENCOUNTER — Ambulatory Visit (INDEPENDENT_AMBULATORY_CARE_PROVIDER_SITE_OTHER): Payer: Medicare Other | Admitting: Physician Assistant

## 2018-08-09 ENCOUNTER — Encounter: Payer: Self-pay | Admitting: Physician Assistant

## 2018-08-09 DIAGNOSIS — G5702 Lesion of sciatic nerve, left lower limb: Secondary | ICD-10-CM

## 2018-08-09 DIAGNOSIS — I10 Essential (primary) hypertension: Secondary | ICD-10-CM | POA: Diagnosis not present

## 2018-08-09 MED ORDER — AMLODIPINE BESYLATE 10 MG PO TABS: 5.0000 mg | ORAL_TABLET | Freq: Every day | ORAL | 3 refills | Status: DC

## 2018-08-09 MED ORDER — HYDROCODONE-ACETAMINOPHEN 10-325 MG PO TABS: 1.0000 | ORAL_TABLET | Freq: Three times a day (TID) | ORAL | 0 refills | Status: DC | PRN

## 2018-08-13 NOTE — Progress Notes (Signed)
BP (!) 146/83 (BP Location: Left Leg)   Pulse 78   Temp 98.7 F (37.1 C) (Oral)   Ht 5' 8"  (1.727 m)   Wt 245 lb (111.1 kg)   BMI 37.25 kg/m    Subjective:    Patient ID: Donald Butler, male    DOB: November 07, 1960, 57 y.o.   MRN: [id number]  HPI: Donald Butler is a 57 y.o. male presenting on 08/09/2018 for Annual Exam (yearly disability insurance)  Patient had his annual exam in September of this year.  This exam was just to discuss his chronic disability.  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  He is never been able to regain full strength and mobility of his left lower leg.  He is still on disability for this.  The forms will be completed and placed in his chart.  He also is having some hypertension issues.  We will update medications.  He has no other complaints at this time.  All of his labs and visits to other physicians are reviewed.  Past Medical History:  Diagnosis Date  . Allergy   . GERD (gastroesophageal reflux disease)   . Gout   . Hyperlipidemia   . Hypertension   . Internal hemorrhoid   . Nerve damage    left leg  . 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 Interim medical history since our last visit reviewed. Allergies and medications reviewed and updated. DATA REVIEWED: CHART IN EPIC  Family History reviewed for pertinent findings.  Review of Systems  Constitutional: Negative.  Negative for appetite change and fatigue.  Eyes: Negative for pain and visual disturbance.  Respiratory: Negative.  Negative for cough, chest tightness, shortness of breath and wheezing.   Cardiovascular: Negative.  Negative for chest pain, palpitations and leg swelling.  Gastrointestinal: Negative.  Negative for abdominal pain, diarrhea, nausea and vomiting.  Genitourinary: Negative.   Musculoskeletal:  Positive for arthralgias, gait problem and myalgias.  Skin: Negative.  Negative for color change and rash.  Neurological: Negative for weakness, numbness and headaches.  Psychiatric/Behavioral: Negative.     Allergies as of 08/09/2018      Reactions   Penicillins Hives, Shortness Of Breath, Swelling      Medication List       Accurate as of August 09, 2018 11:59 PM. Always use your most recent med list.        albuterol 108 (90 Base) MCG/ACT inhaler Commonly known as:  PROVENTIL HFA;VENTOLIN HFA Inhale 2 puffs into the lungs every 6 (six) hours as needed for wheezing or shortness of breath.   allopurinol 300 MG tablet Commonly known as:  ZYLOPRIM Take 1 tablet (300 mg total) by mouth daily.   amLODipine 10 MG tablet Commonly known as:  NORVASC Take 0.5 tablets (5 mg total) by mouth daily.   aspirin 325 MG tablet Take 325 mg by mouth daily.   atorvastatin 20 MG tablet Commonly known as:  LIPITOR Take 1 tablet (20 mg total) by mouth daily.   HYDROcodone-acetaminophen 10-325 MG tablet Commonly known as:  NORCO Take 1 tablet by mouth every 8 (eight) hours as needed.   lisinopril 40 MG tablet Commonly known as:  PRINIVIL,ZESTRIL Take 1 tablet (40 mg total) by mouth daily.   loratadine 10 MG tablet Commonly known as:  CLARITIN Take 10  mg by mouth daily.   multivitamin tablet Take 1 tablet by mouth daily.   omeprazole 20 MG capsule Commonly known as:  PRILOSEC Take 20 mg by mouth daily.   VITAMIN B-12 PO Take 5,000 mcg by mouth daily.          Objective:    BP (!) 146/83 (BP Location: Left Leg)   Pulse 78   Temp 98.7 F (37.1 C) (Oral)   Ht 5' 8"  (1.727 m)   Wt 245 lb (111.1 kg)   BMI 37.25 kg/m   Allergies  Allergen Reactions  . Penicillins Hives, Shortness Of Breath and Swelling    Wt Readings from Last 3 Encounters:  08/09/18 245 lb (111.1 kg)  06/03/18 241 lb 12.8 oz (109.7 kg)  05/13/18 241 lb (109.3 kg)    Physical Exam Vitals signs  and nursing note reviewed.  Constitutional:      General: He is not in acute distress.    Appearance: He is well-developed.  HENT:     Head: Normocephalic and atraumatic.  Eyes:     Conjunctiva/sclera: Conjunctivae normal.     Pupils: Pupils are equal, round, and reactive to light.  Cardiovascular:     Rate and Rhythm: Normal rate and regular rhythm.     Heart sounds: Normal heart sounds.  Pulmonary:     Effort: Pulmonary effort is normal. No respiratory distress.     Breath sounds: Normal breath sounds.  Musculoskeletal:     Left knee: He exhibits decreased range of motion and deformity. Tenderness found. Lateral joint line tenderness noted.       Legs:  Skin:    General: Skin is warm and dry.  Psychiatric:        Behavior: Behavior normal.     Results for orders placed or performed in visit on 05/13/18  CBC with Differential/Platelet  Result Value Ref Range   WBC 7.5 3.4 - 10.8 x10E3/uL   RBC 4.69 4.14 - 5.80 x10E6/uL   Hemoglobin 15.4 13.0 - 17.7 g/dL   Hematocrit 44.1 37.5 - 51.0 %   MCV 94 79 - 97 fL   MCH 32.8 26.6 - 33.0 pg   MCHC 34.9 31.5 - 35.7 g/dL   RDW 12.0 (L) 12.3 - 15.4 %   Platelets 215 150 - 450 x10E3/uL   Neutrophils 60 Not Estab. %   Lymphs 26 Not Estab. %   Monocytes 9 Not Estab. %   Eos 4 Not Estab. %   Basos 1 Not Estab. %   Neutrophils Absolute 4.6 1.4 - 7.0 x10E3/uL   Lymphocytes Absolute 2.0 0.7 - 3.1 x10E3/uL   Monocytes Absolute 0.7 0.1 - 0.9 x10E3/uL   EOS (ABSOLUTE) 0.3 0.0 - 0.4 x10E3/uL   Basophils Absolute 0.1 0.0 - 0.2 x10E3/uL   Immature Granulocytes 0 Not Estab. %   Immature Grans (Abs) 0.0 0.0 - 0.1 x10E3/uL  CMP14+EGFR  Result Value Ref Range   Glucose 108 (H) 65 - 99 mg/dL   BUN 18 6 - 24 mg/dL   Creatinine, Ser 0.80 0.76 - 1.27 mg/dL   GFR calc non Af Amer 98 >59 mL/min/1.73   GFR calc Af Amer 114 >59 mL/min/1.73   BUN/Creatinine Ratio 23 (H) 9 - 20   Sodium 139 134 - 144 mmol/L   Potassium 4.2 3.5 - 5.2 mmol/L    Chloride 102 96 - 106 mmol/L   CO2 22 20 - 29 mmol/L   Calcium 9.2 8.7 - 10.2 mg/dL   Total  Protein 6.7 6.0 - 8.5 g/dL   Albumin 4.4 3.5 - 5.5 g/dL   Globulin, Total 2.3 1.5 - 4.5 g/dL   Albumin/Globulin Ratio 1.9 1.2 - 2.2   Bilirubin Total 0.4 0.0 - 1.2 mg/dL   Alkaline Phosphatase 70 39 - 117 IU/L   AST 35 0 - 40 IU/L   ALT 38 0 - 44 IU/L  Lipid panel  Result Value Ref Range   Cholesterol, Total 158 100 - 199 mg/dL   Triglycerides 287 (H) 0 - 149 mg/dL   HDL 48 >39 mg/dL   VLDL Cholesterol Cal 57 (H) 5 - 40 mg/dL   LDL Calculated 53 0 - 99 mg/dL   Chol/HDL Ratio 3.3 0.0 - 5.0 ratio  Thyroid Panel With TSH  Result Value Ref Range   TSH 2.010 0.450 - 4.500 uIU/mL   T4, Total 6.1 4.5 - 12.0 ug/dL   T3 Uptake Ratio 26 24 - 39 %   Free Thyroxine Index 1.6 1.2 - 4.9  PSA  Result Value Ref Range   Prostate Specific Ag, Serum 0.6 0.0 - 4.0 ng/mL      Assessment & Plan:   1. Compression of common peroneal nerve, left - HYDROcodone-acetaminophen (NORCO) 10-325 MG tablet; Take 1 tablet by mouth every 8 (eight) hours as needed.  Dispense: 30 tablet; Refill: 0  2. Essential hypertension - amLODipine (NORVASC) 10 MG tablet; Take 0.5 tablets (5 mg total) by mouth daily.  Dispense: 90 tablet; Refill: 3   Continue all other maintenance medications as listed above.  Follow up plan: Return in about 6 months (around 02/08/2019).  Educational handout given for [place] PA-C [facility] [address]  [place], [place] [address] [phone]   08/13/2018, 1:10 PM

## 2018-09-06 ENCOUNTER — Encounter: Payer: Self-pay | Admitting: Cardiovascular Disease

## 2018-09-06 ENCOUNTER — Ambulatory Visit (INDEPENDENT_AMBULATORY_CARE_PROVIDER_SITE_OTHER): Payer: Medicare Other | Admitting: Cardiovascular Disease

## 2018-09-06 VITALS — BP 148/84 | HR 81 | Ht 68.0 in | Wt 244.0 lb

## 2018-09-06 DIAGNOSIS — I10 Essential (primary) hypertension: Secondary | ICD-10-CM | POA: Diagnosis not present

## 2018-09-06 DIAGNOSIS — R079 Chest pain, unspecified: Secondary | ICD-10-CM | POA: Diagnosis not present

## 2018-09-06 DIAGNOSIS — R931 Abnormal findings on diagnostic imaging of heart and coronary circulation: Secondary | ICD-10-CM | POA: Diagnosis not present

## 2018-09-06 DIAGNOSIS — R0609 Other forms of dyspnea: Secondary | ICD-10-CM

## 2018-09-06 DIAGNOSIS — E782 Mixed hyperlipidemia: Secondary | ICD-10-CM | POA: Diagnosis not present

## 2018-09-06 NOTE — Patient Instructions (Signed)
Medication Instructions:  Continue all current medications.  Labwork: none  Testing/Procedures: none  Follow-Up: As needed.    Any Other Special Instructions Will Be Listed Below (If Applicable).  If you need a refill on your cardiac medications before your next appointment, please call your pharmacy.  

## 2018-09-06 NOTE — Progress Notes (Signed)
SUBJECTIVE: The patient returns for follow-up after undergoing cardiovascular testing performed for the evaluation of chest pain, exertional dyspnea, and fatigue.  Coronary CT angiography showed a coronary artery calcium score of 856 Agatston units placing him in the 97th percentile for age and gender, suggesting a high risk for future cardiac events.  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  Echocardiogram on 06/17/2018 showed normal left ventricular systolic function and regional wall motion, LVEF 60 to 65%, and mild mitral regurgitation.  He told me that he was struggling with diverticulitis the last time he saw me and since going on medications, his symptoms have significantly improved.  He had been belching a lot and has some residual gas but not nearly as severe as it had been.  He very seldom has chest pains.  He believes he has shortness of breath due to being obese.  He said "I love to eat ".  He said eating is one of his simple pleasures.  He likes chicken, red meat, and pork.  He said he is disabled and has difficulty getting physical activity.     Review of Systems: As per "subjective", otherwise negative.  Allergies  Allergen Reactions  . Penicillins Hives, Shortness Of Breath and Swelling    Current Outpatient Medications  Medication Sig Dispense Refill  . albuterol (PROVENTIL HFA;VENTOLIN HFA) 108 (90 Base) MCG/ACT inhaler Inhale 2 puffs into the lungs every 6 (six) hours as needed for wheezing or shortness of breath. 1 Inhaler 2  . allopurinol (ZYLOPRIM) 300 MG tablet Take 1 tablet (300 mg total) by mouth daily. 90 tablet 3  . amLODipine (NORVASC) 10 MG tablet Take 20 mg by mouth daily.    Marland Kitchen aspirin 325 MG tablet Take 325 mg by mouth daily.    Marland Kitchen atorvastatin (LIPITOR) 20 MG tablet Take 1 tablet (20 mg total) by mouth daily. 90 tablet 3  . Cyanocobalamin (VITAMIN B-12 PO) Take 5,000 mcg by  mouth daily.    Marland Kitchen HYDROcodone-acetaminophen (NORCO) 10-325 MG tablet Take 1 tablet by mouth every 8 (eight) hours as needed. 30 tablet 0  . lisinopril (PRINIVIL,ZESTRIL) 40 MG tablet Take 1 tablet (40 mg total) by mouth daily. 90 tablet 3  . loratadine (CLARITIN) 10 MG tablet Take 10 mg by mouth daily.    . Multiple Vitamin (MULTIVITAMIN) tablet Take 1 tablet by mouth daily.    Marland Kitchen omeprazole (PRILOSEC) 20 MG capsule Take 20 mg by mouth daily.     No current facility-administered medications for this visit.     Past Medical History:  Diagnosis Date  . Allergy   . GERD (gastroesophageal reflux disease)   . Gout   . Hyperlipidemia   . Hypertension   . Internal hemorrhoid   . Nerve damage    left leg  . 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 [place]   ruptured disk  x 2  . CARPAL TUNNEL RELEASE Left   . COLONOSCOPY    . EXTERNAL EAR SURGERY    . KNEE ARTHROSCOPY  2006, 2008   x 2  left  . MASTOIDECTOMY  2013   right  . OPEN ANTERIOR SHOULDER RECONSTRUCTION  1970   right  . POLYPECTOMY    . reconstruction left leg  2009   multiple surgery after accident  Social History   Socioeconomic History  . Marital status: Married    Spouse name: Not on file  . Number of children: Not on file  . Years of education: Not on file  . Highest education level: Not on file  Occupational History  . Not on file  Social Needs  . Financial resource strain: Not on file  . Food insecurity:    Worry: Not on file    Inability: Not on file  . Transportation needs:    Medical: Not on file    Non-medical: Not on file  Tobacco Use  . Smoking status: Former Smoker    Last attempt to quit: 04/22/1988    Years since quitting: 30.3  . Smokeless tobacco: Former Systems developer    Types: [place] date: 06/22/2012  Substance and Sexual Activity  . Alcohol use: Yes    Alcohol/week: 21.0  standard drinks    Types: 21 Standard drinks or equivalent per week  . Drug use: No  . Sexual activity: Not on file  Lifestyle  . Physical activity:    Days per week: Not on file    Minutes per session: Not on file  . Stress: Not on file  Relationships  . Social connections:    Talks on phone: Not on file    Gets together: Not on file    Attends religious service: Not on file    Active member of club or organization: Not on file    Attends meetings of clubs or organizations: Not on file    Relationship status: Not on file  . Intimate partner violence:    Fear of current or ex partner: Not on file    Emotionally abused: Not on file    Physically abused: Not on file    Forced sexual activity: Not on file  Other Topics Concern  . Not on file  Social History Narrative  . Not on file     Vitals:   09/06/18 0852  BP: (!) 148/84  Pulse: 81  SpO2: 98%  Weight: 244 lb (110.7 kg)  Height: 5\' 8"  (1.727 m)    Wt Readings from Last 3 Encounters:  09/06/18 244 lb (110.7 kg)  08/09/18 245 lb (111.1 kg)  06/03/18 241 lb 12.8 oz (109.7 kg)     PHYSICAL EXAM General: NAD HEENT: Normal. Neck: No JVD, no thyromegaly. Lungs: Clear to auscultation bilaterally with normal respiratory effort. CV: Regular rate and rhythm, normal S1/S2, no S3/S4, no murmur. No pretibial or periankle edema.   Abdomen: Obese.  Neurologic: Alert and oriented.  Psych: Normal affect. Skin: Normal. Musculoskeletal: No gross deformities.    ECG: Reviewed above under Subjective   Labs: Lab Results  Component Value Date/Time   K 4.2 05/13/2018 04:10 PM   BUN 18 05/13/2018 04:10 PM   CREATININE 0.80 05/13/2018 04:10 PM   ALT 38 05/13/2018 04:10 PM   TSH 2.010 05/13/2018 04:10 PM   HGB 15.4 05/13/2018 04:10 PM     Lipids: Lab Results  Component Value Date/Time   LDLCALC 53 05/13/2018 04:10 PM   CHOL 158 05/13/2018 04:10 PM   TRIG 287 (H) 05/13/2018 04:10 PM   HDL 48 05/13/2018 04:10 PM         ASSESSMENT AND PLAN: 1.  Exertional dyspnea and fatigue with chest pain: Symptoms have essentially resolved and may have been due to GI issues.  Coronary CT angiography results reviewed above with mild nonobstructive disease.  He is already on  aspirin and statin therapy.  He has been taking 325 mg of aspirin for over 20 years on his own.  I recommended he reduce the dose to 81 mg.  I warned him of possible GI side effects including ulceration and bleeding.  I also educated him on the importance of a healthy cardiac diet and physical activity.  2.  Hypertension: Blood pressure remains elevated.  This is managed by PCP.  He needs dietary modification and significant weight loss.  3.  Mixed dyslipidemia: Lipids reviewed above.  Continue atorvastatin 20 mg.   Disposition: Follow up with me as needed   Kate Sable, M.D., F.A.C.C.

## 2019-02-08 ENCOUNTER — Other Ambulatory Visit: Payer: Self-pay

## 2019-02-09 ENCOUNTER — Encounter: Payer: Self-pay | Admitting: Physician Assistant

## 2019-02-09 ENCOUNTER — Ambulatory Visit (INDEPENDENT_AMBULATORY_CARE_PROVIDER_SITE_OTHER): Payer: Medicare Other | Admitting: Physician Assistant

## 2019-02-09 VITALS — BP 130/88 | HR 81 | Temp 98.2°F | Ht 68.0 in | Wt 243.0 lb

## 2019-02-09 DIAGNOSIS — K5792 Diverticulitis of intestine, part unspecified, without perforation or abscess without bleeding: Secondary | ICD-10-CM

## 2019-02-09 DIAGNOSIS — G5702 Lesion of sciatic nerve, left lower limb: Secondary | ICD-10-CM

## 2019-02-09 DIAGNOSIS — Z0000 Encounter for general adult medical examination without abnormal findings: Secondary | ICD-10-CM

## 2019-02-09 MED ORDER — MOXIFLOXACIN HCL 400 MG PO TABS: 400.0000 mg | ORAL_TABLET | Freq: Every day | ORAL | 0 refills | Status: DC

## 2019-02-09 MED ORDER — HYDROCODONE-ACETAMINOPHEN 10-325 MG PO TABS: 1.0000 | ORAL_TABLET | Freq: Three times a day (TID) | ORAL | 0 refills | Status: DC | PRN

## 2019-02-14 NOTE — Progress Notes (Signed)
BP 130/88   Pulse 81   Temp 98.2 F (36.8 C) (Oral)   Ht _0  (1.727 m)   Wt 243 lb (110.2 kg)   BMI 36.95 kg/m    Subjective:    Patient ID: Donald Butler, male    DOB: 11-20-1960, 58 y.o.   MRN: [id number]  HPI: Donald Butler is a 58 y.o. male presenting on 02/09/2019 for Hypertension (6 month follow up ) and Medical Management of Chronic Issues  Patient comes in for follow-up on his hypertension.  He states he is doing really well at this time.  Blood pressure has been very good every spring.  He is not having any chest pain.  He also states he is having a flareup of his diverticulitis.  He was not able to tolerate metronidazole very well, and would like to try something different this time.  He states that overall he is doing okay with the chronic pain of the his peroneal nerve damage.  He will continue with long-term disability.  Past Medical History:  Diagnosis Date  . Allergy   . GERD (gastroesophageal reflux disease)   . Gout   . Hyperlipidemia   . Hypertension   . Internal hemorrhoid   . Nerve damage    left leg  . 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 Interim medical history since our last visit reviewed. Allergies and medications reviewed and updated. DATA REVIEWED: CHART IN EPIC  Family History reviewed for pertinent findings.  Review of Systems  Constitutional: Negative.  Negative for appetite change and fatigue.  Eyes: Negative for pain and visual disturbance.  Respiratory: Negative.  Negative for cough, chest tightness, shortness of breath and wheezing.   Cardiovascular: Negative.  Negative for chest pain, palpitations and leg swelling.  Gastrointestinal: Negative.  Negative for abdominal pain, diarrhea, nausea and vomiting.  Genitourinary: Negative.   Musculoskeletal: Positive for arthralgias and myalgias.  Skin:  Negative.  Negative for color change and rash.  Neurological: Negative.  Negative for weakness, numbness and headaches.  Psychiatric/Behavioral: Negative.     Allergies as of 02/09/2019      Reactions   Penicillins Hives, Shortness Of Breath, Swelling      Medication List       Accurate as of February 09, 2019 11:59 PM. If you have any questions, ask your nurse or doctor.        albuterol 108 (90 Base) MCG/ACT inhaler Commonly known as: VENTOLIN HFA Inhale 2 puffs into the lungs every 6 (six) hours as needed for wheezing or shortness of breath.   allopurinol 300 MG tablet Commonly known as: ZYLOPRIM Take 1 tablet (300 mg total) by mouth daily.   amLODipine 10 MG tablet Commonly known as: NORVASC Take 10 mg by mouth daily.   aspirin 325 MG tablet Take 325 mg by mouth daily.   atorvastatin 20 MG tablet Commonly known as: LIPITOR Take 1 tablet (20 mg total) by mouth daily.   HYDROcodone-acetaminophen 10-325 MG tablet Commonly known as: NORCO Take 1 tablet by mouth every 8 (eight) hours as needed.   lisinopril 40 MG tablet Commonly known as: ZESTRIL Take 1 tablet (40 mg total) by mouth daily.   loratadine 10 MG tablet Commonly known as: CLARITIN Take 10 mg by mouth daily.   moxifloxacin 400 MG tablet Commonly known as: Avelox Take 1 tablet (400 mg  total) by mouth daily at 8 pm. Started by: Terald Sleeper, PA-C   multivitamin tablet Take 1 tablet by mouth daily.   omeprazole 20 MG capsule Commonly known as: PRILOSEC Take 20 mg by mouth daily.   VITAMIN B-12 PO Take 5,000 mcg by mouth daily.          Objective:    BP 130/88   Pulse 81   Temp 98.2 F (36.8 C) (Oral)   Ht _0  (1.727 m)   Wt 243 lb (110.2 kg)   BMI 36.95 kg/m   Allergies  Allergen Reactions  . Penicillins Hives, Shortness Of Breath and Swelling    Wt Readings from Last 3 Encounters:  02/09/19 243 lb (110.2 kg)  09/06/18 244 lb (110.7 kg)  08/09/18 245 lb (111.1 kg)    Physical  Exam Vitals signs and nursing note reviewed.  Constitutional:      General: He is not in acute distress.    Appearance: He is well-developed.  HENT:     Head: Normocephalic and atraumatic.  Eyes:     Conjunctiva/sclera: Conjunctivae normal.     Pupils: Pupils are equal, round, and reactive to light.  Cardiovascular:     Rate and Rhythm: Normal rate and regular rhythm.     Heart sounds: Normal heart sounds.  Pulmonary:     Effort: Pulmonary effort is normal. No respiratory distress.     Breath sounds: Normal breath sounds.  Skin:    General: Skin is warm and dry.  Psychiatric:        Behavior: Behavior normal.       Assessment & Plan:   1. Compression of common peroneal nerve, left - HYDROcodone-acetaminophen (NORCO) 10-325 MG tablet; Take 1 tablet by mouth every 8 (eight) hours as needed.  Dispense: 30 tablet; Refill: 0  2. Diverticulitis - moxifloxacin (AVELOX) 400 MG tablet; Take 1 tablet (400 mg total) by mouth daily at 8 pm.  Dispense: 10 tablet; Refill: 0 - CBC with Differential/Platelet; Future  3. Well adult exam - CBC with Differential/Platelet; Future - CMP14+EGFR; Future - Lipid panel; Future - PSA; Future - TSH; Future   Continue all other maintenance medications as listed above.  Follow up plan: Return in about 9 months (around 11/09/2019).  Educational handout given for [place] PA-C [facility] [address]  [place], [place] [address] [phone]   02/14/2019, 1:33 PM

## 2019-03-21 ENCOUNTER — Other Ambulatory Visit: Payer: Self-pay | Admitting: Physician Assistant

## 2019-03-21 ENCOUNTER — Telehealth: Payer: Self-pay | Admitting: Physician Assistant

## 2019-03-21 DIAGNOSIS — R1032 Left lower quadrant pain: Secondary | ICD-10-CM

## 2019-03-21 DIAGNOSIS — R109 Unspecified abdominal pain: Secondary | ICD-10-CM

## 2019-03-21 DIAGNOSIS — R140 Abdominal distension (gaseous): Secondary | ICD-10-CM

## 2019-03-21 NOTE — Telephone Encounter (Signed)
ordered

## 2019-03-21 NOTE — Telephone Encounter (Signed)
Patient aware referral placed.

## 2019-03-21 NOTE — Progress Notes (Signed)
Gi

## 2019-03-23 ENCOUNTER — Telehealth: Payer: Self-pay | Admitting: Gastroenterology

## 2019-03-23 NOTE — Telephone Encounter (Signed)
Patient reports that he is having frequent belching and loose stool.  He has some abdominal pain, but mainly bloating and pressure.  He has been taking a Prilosec 20 mg.  He is asked to increase his Prilosec to 40 mg, Patient instructed to maintain an anti-reflux diet. Advised to avoid caffeine, mint, citrus foods/juices, tomatoes,  chocolate, NSAIDS/ASA products.  Instructed not to eat within 2 hours of exercise or bed, multiple small meals are better than 3 large meals.  Need to take PPI 30 minutes prior to 1st meal of the day. And he will try a trial of FDGard.  He is advised that I will add him to the cancellation list and contact him if there is an earlier opening.

## 2019-04-13 ENCOUNTER — Encounter

## 2019-04-13 ENCOUNTER — Encounter: Payer: Self-pay | Admitting: Nurse Practitioner

## 2019-04-13 ENCOUNTER — Ambulatory Visit (INDEPENDENT_AMBULATORY_CARE_PROVIDER_SITE_OTHER): Payer: Medicare Other | Admitting: Nurse Practitioner

## 2019-04-13 VITALS — BP 128/74 | HR 95 | Temp 98.1°F | Ht 68.0 in | Wt 245.0 lb

## 2019-04-13 DIAGNOSIS — R140 Abdominal distension (gaseous): Secondary | ICD-10-CM

## 2019-04-13 DIAGNOSIS — K648 Other hemorrhoids: Secondary | ICD-10-CM | POA: Diagnosis not present

## 2019-04-13 DIAGNOSIS — R931 Abnormal findings on diagnostic imaging of heart and coronary circulation: Secondary | ICD-10-CM

## 2019-04-13 DIAGNOSIS — R112 Nausea with vomiting, unspecified: Secondary | ICD-10-CM

## 2019-04-13 DIAGNOSIS — R143 Flatulence: Secondary | ICD-10-CM

## 2019-04-13 DIAGNOSIS — K625 Hemorrhage of anus and rectum: Secondary | ICD-10-CM | POA: Diagnosis not present

## 2019-04-13 MED ORDER — HYDROCORTISONE (PERIANAL) 2.5 % EX CREA: 1.0000 "application " | TOPICAL_CREAM | Freq: Two times a day (BID) | CUTANEOUS | 1 refills | Status: DC

## 2019-04-13 NOTE — Patient Instructions (Signed)
If you are age 59 or older, your body mass index should be between 23-30. Your Body mass index is 37.25 kg/m. If this is out of the aforementioned range listed, please consider follow up with your Primary Care Provider.  If you are age 21 or younger, your body mass index should be between 19-25. Your Body mass index is 37.25 kg/m. If this is out of the aformentioned range listed, please consider follow up with your Primary Care Provider.   You have been scheduled for an endoscopy. Please follow written instructions given to you at your visit today. If you use inhalers (even only as needed), please bring them with you on the day of your procedure. Your physician has requested that you go to www.startemmi.com and enter the access code given to you at your visit today. This web site gives a general overview about your procedure. However, you should still follow specific instructions given to you by our office regarding your preparation for the procedure.  We have sent the following medications to your pharmacy for you to pick up at your convenience: Anusol cream  Thank you for choosing me and [facility].   Tye Savoy, NP

## 2019-04-14 ENCOUNTER — Telehealth: Payer: Self-pay

## 2019-04-14 ENCOUNTER — Encounter: Payer: Self-pay | Admitting: Nurse Practitioner

## 2019-04-14 NOTE — Telephone Encounter (Signed)
Covid-19 screening questions   Do you now or have you had a fever in the last 14 days?  Do you have any respiratory symptoms of shortness of breath or cough now or in the last 14 days?  Do you have any family members or close contacts with diagnosed or suspected Covid-19 in the past 14 days?  Have you been tested for Covid-19 and found to be positive?       

## 2019-04-14 NOTE — Progress Notes (Signed)
Reviewed and agree with management plan. Consider rectal to a Colorectal surgeon for mgmt of hemorrhoids.   Pricilla Riffle. Fuller Plan, MD [facility]
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
## 2019-04-14 NOTE — Telephone Encounter (Signed)
Patient answered "no" to all questions.  

## 2019-04-15 ENCOUNTER — Encounter: Payer: Self-pay | Admitting: Gastroenterology

## 2019-04-15 ENCOUNTER — Ambulatory Visit (AMBULATORY_SURGERY_CENTER): Payer: Medicare Other | Admitting: Gastroenterology

## 2019-04-15 ENCOUNTER — Other Ambulatory Visit: Payer: Self-pay

## 2019-04-15 VITALS — BP 116/84 | HR 83 | Temp 97.7°F | Resp 16 | Ht 68.0 in | Wt 245.0 lb

## 2019-04-15 DIAGNOSIS — K449 Diaphragmatic hernia without obstruction or gangrene: Secondary | ICD-10-CM

## 2019-04-15 DIAGNOSIS — K3189 Other diseases of stomach and duodenum: Secondary | ICD-10-CM | POA: Diagnosis not present

## 2019-04-15 DIAGNOSIS — K317 Polyp of stomach and duodenum: Secondary | ICD-10-CM | POA: Diagnosis not present

## 2019-04-15 DIAGNOSIS — R112 Nausea with vomiting, unspecified: Secondary | ICD-10-CM | POA: Diagnosis not present

## 2019-04-15 MED ORDER — SODIUM CHLORIDE 0.9 % IV SOLN: 500.0000 mL | Freq: Once | INTRAVENOUS | Status: DC

## 2019-04-15 MED ORDER — ONDANSETRON HCL 4 MG PO TABS: 4.0000 mg | ORAL_TABLET | Freq: Four times a day (QID) | ORAL | 1 refills | Status: DC | PRN

## 2019-04-15 NOTE — Progress Notes (Signed)
PT taken to PACU. Monitors in place. VSS. Report given to RN. 

## 2019-04-15 NOTE — Patient Instructions (Signed)
Handout given for Hiatal Hernia.  Continue present medications and take antiemetics as needed.  Pick up new prescription for Zofran 4 mg (dissolve under your tongue) and take every 6 hrs as needed.  YOU HAD AN ENDOSCOPIC PROCEDURE TODAY AT [facility]:   Refer to the procedure report that was given to you for any specific questions about what was found during the examination.  If the procedure report does not answer your questions, please call your gastroenterologist to clarify.  If you requested that your care partner not be given the details of your procedure findings, then the procedure report has been included in a sealed envelope for you to review at your convenience later.  YOU SHOULD EXPECT: Some feelings of bloating in the abdomen. Passage of more gas than usual.  Walking can help get rid of the air that was put into your GI tract during the procedure and reduce the bloating. If you had a lower endoscopy (such as a colonoscopy or flexible sigmoidoscopy) you may notice spotting of blood in your stool or on the toilet paper. If you underwent a bowel prep for your procedure, you may not have a normal bowel movement for a few days.  Please Note:  You might notice some irritation and congestion in your nose or some drainage.  This is from the oxygen used during your procedure.  There is no need for concern and it should clear up in a day or so.  SYMPTOMS TO REPORT IMMEDIATELY:   Following upper endoscopy (EGD)  Vomiting of blood or coffee ground material  New chest pain or pain under the shoulder blades  Painful or persistently difficult swallowing  New shortness of breath  Fever of 100F or higher  Black, tarry-looking stools  For urgent or emergent issues, a gastroenterologist can be reached at any hour by calling [phone].   DIET:  We do recommend a small meal at first, but then you may proceed to your regular diet.  Drink plenty of fluids but you should avoid  alcoholic beverages for 24 hours.  ACTIVITY:  You should plan to take it easy for the rest of today and you should NOT DRIVE or use heavy machinery until tomorrow (because of the sedation medicines used during the test).    FOLLOW UP: Our staff will call the number listed on your records 48-72 hours following your procedure to check on you and address any questions or concerns that you may have regarding the information given to you following your procedure. If we do not reach you, we will leave a message.  We will attempt to reach you two times.  During this call, we will ask if you have developed any symptoms of COVID 19. If you develop any symptoms (ie: fever, flu-like symptoms, shortness of breath, cough etc.) before then, please call [phone].  If you test positive for Covid 19 in the 2 weeks post procedure, please call and report this information to [place].    If any biopsies were taken you will be contacted by phone or by letter within the next 1-3 weeks.  Please call us at [phone] if you have not heard about the biopsies in 3 weeks.    SIGNATURES/CONFIDENTIALITY: You and/or your care partner have signed paperwork which will be entered into your electronic medical record.  These signatures attest to the fact that that the information above on your After Visit Summary has been reviewed and is understood.  Full responsibility of  the confidentiality of this discharge information lies with you and/or your care-partner. 

## 2019-04-15 NOTE — Progress Notes (Signed)
Pt's states no medical or surgical changes since previsit or office visit.  Temps - June Bullock Vitals - Courtney Washington 

## 2019-04-15 NOTE — Progress Notes (Signed)
Called to room to assist during endoscopic procedure.  Patient ID and intended procedure confirmed with present staff. Received instructions for my participation in the procedure from the performing physician.  

## 2019-04-15 NOTE — Op Note (Addendum)
[facility] Patient Name: Donald Butler Procedure Date: 04/15/2019 7:02 AM MRN: [id number] Endoscopist: Remo Lipps P. Havery Moros , MD Age: 58 Referring MD:  Date of Birth: [birth year] Gender: Male Account #: [id number] Procedure:                Upper GI endoscopy Indications:              Nausea with vomiting, early satiety, bloating,                            patient also endorsed persistent diarrhea Medicines:                Monitored Anesthesia Care Procedure:                Pre-Anesthesia Assessment:                           - Prior to the procedure, a History and Physical                            was performed, and patient medications and                            allergies were reviewed. The patient's tolerance of                            previous anesthesia was also reviewed. The risks                            and benefits of the procedure and the sedation                            options and risks were discussed with the patient.                            All questions were answered, and informed consent                            was obtained. Prior Anticoagulants: The patient has                            taken no previous anticoagulant or antiplatelet                            agents. ASA Grade Assessment: II - A patient with                            mild systemic disease. After reviewing the risks                            and benefits, the patient was deemed in                            satisfactory condition to undergo the procedure.  After obtaining informed consent, the endoscope was                            passed under direct vision. Throughout the                            procedure, the patient's blood pressure, pulse, and                            oxygen saturations were monitored continuously. The                            Endoscope was introduced through the mouth, and                            advanced to the  second part of duodenum. The upper                            GI endoscopy was accomplished without difficulty.                            The patient tolerated the procedure well. Scope In: Scope Out: Findings:                 Esophagogastric landmarks were identified: the                            Z-line was found at 37 cm, the gastroesophageal                            junction was found at 37 cm and the upper extent of                            the gastric folds was found at 40 cm from the                            incisors. The z-line was just slightly irregular                            but did not meet criteria for Barrett's biopsies.                           A 3 cm hiatal hernia was present.                           Two benign gastric inlet patches were found in the                            upper third of the esophagus.                           The exam of the esophagus was otherwise normal.  Multiple small benign appearing sessile polyps were                            found in the gastric body. Biopsies were taken with                            a cold forceps for histology.                           Striped mildly erythematous mucosa was found in the                            gastric antrum, without focal ulceration. The                            pyloric channel was patent, no stenosis / stricture.                           The exam of the stomach was otherwise normal.                           Biopsies were taken with a cold forceps in the                            gastric body, at the incisura and in the gastric                            antrum for Helicobacter pylori testing.                           The duodenal bulb and second portion of the                            duodenum were normal. Biopsies for histology were                            taken with a cold forceps for evaluation of celiac                             disease. Complications:            No immediate complications. Estimated blood loss:                            Minimal. Estimated Blood Loss:     Estimated blood loss was minimal. Impression:               - Esophagogastric landmarks identified. Slightly                            irregular zline.                           - 3 cm hiatal hernia.                           -  Benign gastric inlet patch in the upper third of                            the esophagus.                           - Normal esophagus otherwise                           - Multiple small benign appearing gastric polyps.                            Biopsied.                           - Erythematous mucosa in the antrum without focal                            ulceration.                           - Normal stomach otherwise - biopsies taken to rule                            out H pylori                           - Normal duodenal bulb and second portion of the                            duodenum. Biopsied. Recommendation:           - Patient has a contact number available for                            emergencies. The signs and symptoms of potential                            delayed complications were discussed with the                            patient. Return to normal activities tomorrow.                            Written discharge instructions were provided to the                            patient.                           - Resume previous diet.                           - Continue present medications. Antiemetics as                            needed. Trial of Zofran 4mg  ODT every 6 hours as  needed.                           - Await pathology results with further                            recommendations. Remo Lipps P. Havery Moros, MD 04/15/2019 8:23:06 AM This report has been signed electronically.

## 2019-04-19 ENCOUNTER — Telehealth: Payer: Self-pay

## 2019-04-19 NOTE — Telephone Encounter (Signed)
  Follow up Call-  Call back number 04/15/2019 06/22/2017  Post procedure Call Back phone  # [phone]  Permission to leave phone message Yes Yes  Some recent data might be hidden     Patient questions:  Do you have a fever, pain , or abdominal swelling? No. Pain Score  0 *  Have you tolerated food without any problems? Yes.    Have you been able to return to your normal activities? Yes.    Do you have any questions about your discharge instructions: Diet   No. Medications  No. Follow up visit  No.  Do you have questions or concerns about your Care? No.  Actions: * If pain score is 4 or above: No action needed, pain <4. 1. Have you developed a fever since your procedure? no  2.   Have you had an respiratory symptoms (SOB or cough) since your procedure? no  3.   Have you tested positive for COVID 19 since your procedure no  4.   Have you had any family members/close contacts diagnosed with the COVID 19 since your procedure?  no   If yes to any of these questions please route to Joylene John, RN and Alphonsa Gin, Therapist, sports.

## 2019-04-20 ENCOUNTER — Telehealth: Payer: Self-pay | Admitting: Gastroenterology

## 2019-04-20 NOTE — Telephone Encounter (Signed)
Called patient and let him know as soon as the path report is reviewed by his Dr. we will call him with the results

## 2019-04-20 NOTE — Telephone Encounter (Signed)
Pt called regarding path results.

## 2019-05-03 ENCOUNTER — Encounter: Payer: Self-pay | Admitting: Nurse Practitioner

## 2019-05-03 ENCOUNTER — Ambulatory Visit (INDEPENDENT_AMBULATORY_CARE_PROVIDER_SITE_OTHER): Payer: Medicare Other | Admitting: Nurse Practitioner

## 2019-05-03 ENCOUNTER — Other Ambulatory Visit: Payer: Self-pay

## 2019-05-03 ENCOUNTER — Ambulatory Visit (INDEPENDENT_AMBULATORY_CARE_PROVIDER_SITE_OTHER)
Admission: RE | Admit: 2019-05-03 | Discharge: 2019-05-03 | Disposition: A | Discharge status: Home / Self Care | Payer: Medicare Other | Source: Ambulatory Visit | Attending: Nurse Practitioner | Admitting: Nurse Practitioner

## 2019-05-03 VITALS — BP 124/82 | HR 92 | Temp 97.6°F | Ht 68.0 in | Wt 250.0 lb

## 2019-05-03 DIAGNOSIS — R140 Abdominal distension (gaseous): Secondary | ICD-10-CM

## 2019-05-03 DIAGNOSIS — R142 Eructation: Secondary | ICD-10-CM | POA: Diagnosis not present

## 2019-05-03 DIAGNOSIS — R194 Change in bowel habit: Secondary | ICD-10-CM

## 2019-05-03 DIAGNOSIS — R112 Nausea with vomiting, unspecified: Secondary | ICD-10-CM

## 2019-05-03 DIAGNOSIS — R931 Abnormal findings on diagnostic imaging of heart and coronary circulation: Secondary | ICD-10-CM

## 2019-05-03 DIAGNOSIS — K5900 Constipation, unspecified: Secondary | ICD-10-CM | POA: Diagnosis not present
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
## 2019-05-03 NOTE — Patient Instructions (Signed)
If you are age 59 or older, your body mass index should be between 23-30. Your Body mass index is 38.01 kg/m. If this is out of the aforementioned range listed, please consider follow up with your Primary Care Provider.  If you are age 70 or younger, your body mass index should be between 19-25. Your Body mass index is 38.01 kg/m. If this is out of the aformentioned range listed, please consider follow up with your Primary Care Provider.   Your provider has requested that you go to the basement level for X-ray before leaving today. Press "B" on the elevator. The lab is located at the first door on the left as you exit the elevator.  You have been scheduled for a gastric emptying scan at [facility] on 05/20/19 at 7:30 am. Please arrive at least 15 minutes prior to your appointment for registration. Please make certain not to have anything to eat or drink after midnight the night before your test. Hold all stomach medications (ex: Zofran, phenergan, Reglan) 48 hours prior to your test. If you need to reschedule your appointment, please contact radiology scheduling at [phone]. _____________________________________________________________________ A gastric-emptying study measures how long it takes for food to move through your stomach. There are several ways to measure stomach emptying. In the most common test, you eat food that contains a small amount of radioactive material. A scanner that detects the movement of the radioactive material is placed over your abdomen to monitor the rate at which food leaves your stomach. This test normally takes about 4 hours to complete. _____________________________________________________________________  We will call you with results.  Thank you for choosing me and [facility].   Tye Savoy, NP

## 2019-05-20 ENCOUNTER — Other Ambulatory Visit: Payer: Self-pay

## 2019-05-20 ENCOUNTER — Ambulatory Visit (HOSPITAL_COMMUNITY)
Admission: RE | Admit: 2019-05-20 | Discharge: 2019-05-20 | Disposition: A | Discharge status: Home / Self Care | Payer: Medicare Other | Source: Ambulatory Visit | Attending: Nurse Practitioner | Admitting: Nurse Practitioner

## 2019-05-20 DIAGNOSIS — R140 Abdominal distension (gaseous): Secondary | ICD-10-CM | POA: Insufficient documentation

## 2019-05-20 DIAGNOSIS — R194 Change in bowel habit: Secondary | ICD-10-CM

## 2019-05-20 DIAGNOSIS — R142 Eructation: Secondary | ICD-10-CM | POA: Insufficient documentation

## 2019-05-20 DIAGNOSIS — R112 Nausea with vomiting, unspecified: Secondary | ICD-10-CM | POA: Diagnosis not present

## 2019-05-20 MED ORDER — TECHNETIUM TC 99M SULFUR COLLOID
2.1000 | Freq: Once | INTRAVENOUS | Status: AC | PRN
  Administered 2019-05-20: 2.1 via INTRAVENOUS

## 2019-05-25 ENCOUNTER — Other Ambulatory Visit: Payer: Self-pay | Admitting: Physician Assistant

## 2019-05-26 ENCOUNTER — Telehealth: Payer: Self-pay

## 2019-05-26 NOTE — Telephone Encounter (Signed)
-----   Message from Willia Craze, NP sent at 05/26/2019 10:08 AM EDT ----- Eustaquio Maize, please let Donald Butler know that his gastric emptying study is normal.  If still having terrible bloating and gas then we can look at a trial of Xifaxan 550 mg 3 times daily x14 days.  Please put him on with Dr. Fuller Plan for follow-up in 6 to 8 weeks.  Thank you

## 2019-05-26 NOTE — Telephone Encounter (Signed)
Patient is out. Spoke with Olivia Mackie who is on the designated party release.  Advised of gastric emptying scan is normal. Olivia Mackie reports the patient took daily Miralax as instructed. It helped at first. Evelina Bucy it for 14 days and felt better. Now his symptoms are back. He has nausea, belching and is not feeling as well. He has not re-developed the same abdominal pain that he was having when he came to see you.  Based on this are we going forward with Xifaxan?

## 2019-05-30 NOTE — Telephone Encounter (Signed)
Yes please. I would like him to have taken the xifaxan by the time he sees Stark. Thanks

## 2019-05-31 ENCOUNTER — Other Ambulatory Visit: Payer: Self-pay | Admitting: *Deleted

## 2019-05-31 MED ORDER — RIFAXIMIN 550 MG PO TABS: 550.0000 mg | ORAL_TABLET | Freq: Three times a day (TID) | ORAL | 0 refills | Status: AC

## 2019-05-31 NOTE — Telephone Encounter (Signed)
Xifaxan 550 mg TID x 14 days sent to [facility]

## 2019-06-06 NOTE — Telephone Encounter (Signed)
Called back. No answer. Got his voicemail. Left a message to call back and ask for Beth.

## 2019-06-06 NOTE — Telephone Encounter (Signed)
Pt requested a call back.  He is upset because pharmacy called him to pick up medication that he was not aware of being prescribed.

## 2019-06-06 NOTE — Telephone Encounter (Signed)
Spoke with the patient. Apologized for the delay in communication. Explained the recommendations of Donald Savoy, NP Patient does not have prescription drug coverage. Samples provided of Xifaxan 550 mg #42. He is agreeable to taking the Xifaxan before seeing Dr Fuller Plan. Patient is adamant he has hemorrhoids that will block stool passage. He would like to address this with the provider.  He is agreeable to scheduling with Dr Fuller Plan.

## 2019-06-27 ENCOUNTER — Other Ambulatory Visit: Payer: Self-pay | Admitting: Physician Assistant

## 2019-07-20 ENCOUNTER — Encounter: Payer: Self-pay | Admitting: Gastroenterology

## 2019-07-20 ENCOUNTER — Ambulatory Visit (INDEPENDENT_AMBULATORY_CARE_PROVIDER_SITE_OTHER): Payer: Medicare Other | Admitting: Gastroenterology

## 2019-07-20 ENCOUNTER — Other Ambulatory Visit: Payer: Self-pay

## 2019-07-20 VITALS — BP 134/80 | HR 73 | Temp 98.0°F | Ht 69.0 in | Wt 249.0 lb

## 2019-07-20 DIAGNOSIS — K641 Second degree hemorrhoids: Secondary | ICD-10-CM

## 2019-07-20 DIAGNOSIS — R931 Abnormal findings on diagnostic imaging of heart and coronary circulation: Secondary | ICD-10-CM

## 2019-07-20 NOTE — Progress Notes (Signed)
    History of Present Illness: This is a 58 year old male complaining of constipation, hemorrhoids.  He has had persistent symptoms with frequent nausea and bloating. GES 05/2019 was normal. Symptoms improved when taking MiraLAX on a daily basis.  He notes persistent problems with hemorrhoids partially prolapsing and intermittent rectal bleeding.  He has between 2 and 5 bowel movements per day.  Several bowel movements are associated with straining.    EGD 03/2019 - Esophagogastric landmarks identified. Slightly irregular zline. - 3 cm hiatal hernia. - Benign gastric inlet patch in the upper third of the esophagus. - Normal esophagus otherwise - Multiple small benign appearing gastric polyps. Biopsied. Fundic gland polyps.  - Erythematous mucosa in the antrum without focal ulceration. Reactive gastropathy.  - Normal stomach otherwise - biopsies taken to rule out H pylori - Normal duodenal bulb and second portion of the duodenum. Biopsied. Normal.   Colonoscopy 06/2017 - Lipomatous IC valve. - Three 6 to 7 mm polyps in the sigmoid colon and in the transverse colon, removed with a cold snare. Resected and retrieved. - Moderate diverticulosis in the left colon. There was narrowing of the colon in association with the diverticular opening. There was evidence of diverticular spasm. There was no evidence of diverticular bleeding. - Internal hemorrhoids. - The examination was otherwise normal on direct and retroflexion views.   Current Medications, Allergies, Past Medical History, Past Surgical History, Family History and Social History were reviewed in [employer] record.    Physical Exam: General: Well developed, well nourished, no acute distress Head: Normocephalic and atraumatic Eyes:  sclerae anicteric, EOMI Ears: Normal auditory acuity Mouth: No deformity or lesions Lungs: Clear throughout to auscultation Heart: Regular rate and rhythm; no murmurs, rubs or  bruits Abdomen: Soft, non tender and non distended. No masses, hepatosplenomegaly or hernias noted. Normal Bowel sounds Rectal: Deferred Musculoskeletal: Symmetrical with no gross deformities  Pulses:  Normal pulses noted Extremities: No clubbing, cyanosis, edema or deformities noted Neurological: Alert oriented x 4, grossly nonfocal Psychological:  Alert and cooperative. Normal mood and affect   Assessment and Recommendations:  1.  Constipation, incomplete fecal evacuation, bloating nausea.  Resume MiraLAX daily.  May titrate dose up or down to target for 1-2 complete bowel movements per day without straining.  2.  Symptomatic internal hemorrhoids, grade 2 and intermittent bleeding.  We discussed options of hemorrhoidal banding or surgical referral for consideration of hemorrhoidectomy.  He does not feel comfortable with returning on 3 separate occasions for hemorrhoidal banding.  He would like more definitive therapy.  Colorectal surgical referral.  3.  Personal history of adenomatous colon polyps.  A 7-year interval surveillance colonoscopy is recommended in November 2025.

## 2019-07-20 NOTE — Patient Instructions (Signed)
We will contact you with an appt date and time to fill out below.   You have been scheduled for an appointment with _________ at [facility]. Your appointment is on _______ at ________. Please arrive at ________ for registration. Make certain to bring a list of current medications, including any over the counter medications or vitamins. Also bring your co-pay if you have one as well as your insurance cards. [facility] is located at [address], [address]. Should you need to reschedule your appointment, please contact them at [phone].  Thank you for choosing me and [facility].  Pricilla Riffle. Dagoberto Ligas., MD., Marval Regal

## 2019-08-01 ENCOUNTER — Telehealth: Payer: Self-pay

## 2019-08-01 NOTE — Telephone Encounter (Signed)
Patient is scheduled to see Dr. Dema Severin at [facility] on 08/08/19. Patient has been notified by [facility] of upcoming appt.

## 2019-09-29 ENCOUNTER — Other Ambulatory Visit: Payer: Self-pay | Admitting: Physician Assistant

## 2019-10-26 DIAGNOSIS — Z23 Encounter for immunization: Secondary | ICD-10-CM | POA: Diagnosis not present

## 2019-11-16 ENCOUNTER — Other Ambulatory Visit: Payer: Self-pay | Admitting: Physician Assistant

## 2019-11-16 NOTE — Telephone Encounter (Signed)
Pt states he is taking 1 tablet a day, the 5 mgs was not controlling his BP Appt on 11/24/19 with you

## 2019-11-16 NOTE — Telephone Encounter (Signed)
  Prescription Request  11/16/2019  What is the name of the medication or equipment? Amlodipine 10 mg and direction should be take one pill a day and directions has half a pill. Should be one a day. Patient has appt 4-8 with Christy for 9 month. Angel patient. He has two pills left  Have you contacted your pharmacy to request a refill? (if applicable) No  Which pharmacy would you like this sent to? [facility] in [place]   Patient notified that their request is being sent to the clinical staff for review and that they should receive a response within 2 business days.

## 2019-11-17 MED ORDER — AMLODIPINE BESYLATE 10 MG PO TABS: 5.0000 mg | ORAL_TABLET | Freq: Every day | ORAL | 0 refills | Status: DC

## 2019-11-23 DIAGNOSIS — Z23 Encounter for immunization: Secondary | ICD-10-CM | POA: Diagnosis not present

## 2019-11-24 ENCOUNTER — Ambulatory Visit: Payer: Medicare Other | Admitting: Family

## 2019-12-06 ENCOUNTER — Ambulatory Visit: Payer: Medicare Other | Admitting: Nurse Practitioner

## 2019-12-06 ENCOUNTER — Telehealth: Payer: Self-pay | Admitting: Physician Assistant

## 2019-12-06 NOTE — Chronic Care Management (AMB) (Signed)
  Chronic Care Management   Outreach Note  12/06/2019 Name: [name] MRN: [id number] DOB: 05/19/1961  Donald Butler is a 59 y.o. year old male who is a primary care patient of Terald Sleeper, PA-C. I reached out to Donald Butler by phone today in response to a referral sent by Mr. Angeline Slim Brickel's health plan.     An unsuccessful telephone outreach was attempted today. The patient was referred to the case management team for assistance with care management and care coordination.   Follow Up Plan: A HIPPA compliant phone message was left for the patient providing contact information and requesting a return call.  The care management team will reach out to the patient again over the next 7 days.  If patient returns call to provider office, please advise to call [facility] at [place], [place], [place], [place] [address] Direct Dial: [phone] [email or web address]

## 2019-12-16 ENCOUNTER — Ambulatory Visit (INDEPENDENT_AMBULATORY_CARE_PROVIDER_SITE_OTHER): Payer: Medicare Other | Admitting: Nurse Practitioner

## 2019-12-16 ENCOUNTER — Encounter: Payer: Self-pay | Admitting: Nurse Practitioner

## 2019-12-16 ENCOUNTER — Other Ambulatory Visit: Payer: Self-pay

## 2019-12-16 VITALS — BP 131/75 | HR 88 | Temp 98.3°F | Ht 69.0 in | Wt 247.1 lb

## 2019-12-16 DIAGNOSIS — M109 Gout, unspecified: Secondary | ICD-10-CM

## 2019-12-16 DIAGNOSIS — K5790 Diverticulosis of intestine, part unspecified, without perforation or abscess without bleeding: Secondary | ICD-10-CM

## 2019-12-16 DIAGNOSIS — K219 Gastro-esophageal reflux disease without esophagitis: Secondary | ICD-10-CM

## 2019-12-16 DIAGNOSIS — G5702 Lesion of sciatic nerve, left lower limb: Secondary | ICD-10-CM

## 2019-12-16 DIAGNOSIS — E782 Mixed hyperlipidemia: Secondary | ICD-10-CM | POA: Diagnosis not present

## 2019-12-16 DIAGNOSIS — I10 Essential (primary) hypertension: Secondary | ICD-10-CM

## 2019-12-16 LAB — CMP14+EGFR
ALT: 54 IU/L — ABNORMAL HIGH (ref 0–44)
AST: 39 IU/L (ref 0–40)
Albumin/Globulin Ratio: 1.7 (ref 1.2–2.2)
Albumin: 4.6 g/dL (ref 3.8–4.9)
Alkaline Phosphatase: 72 IU/L (ref 39–117)
BUN/Creatinine Ratio: 24 — ABNORMAL HIGH (ref 9–20)
BUN: 22 mg/dL (ref 6–24)
Bilirubin Total: 0.5 mg/dL (ref 0.0–1.2)
CO2: 19 mmol/L — ABNORMAL LOW (ref 20–29)
Calcium: 9.4 mg/dL (ref 8.7–10.2)
Chloride: 104 mmol/L (ref 96–106)
Creatinine, Ser: 0.93 mg/dL (ref 0.76–1.27)
GFR calc Af Amer: 104 mL/min/{1.73_m2} (ref >59)
GFR calc non Af Amer: 90 mL/min/{1.73_m2} (ref >59)
Globulin, Total: 2.7 g/dL (ref 1.5–4.5)
Glucose: 101 mg/dL — ABNORMAL HIGH (ref 65–99)
Potassium: 4.6 mmol/L (ref 3.5–5.2)
Sodium: 141 mmol/L (ref 134–144)
Total Protein: 7.3 g/dL (ref 6.0–8.5)

## 2019-12-16 LAB — CBC WITH DIFFERENTIAL/PLATELET
Basophils Absolute: 0 10*3/uL (ref 0.0–0.2)
Basos: 1 %
EOS (ABSOLUTE): 0.3 10*3/uL (ref 0.0–0.4)
Eos: 5 %
Hematocrit: 46.6 % (ref 37.5–51.0)
Hemoglobin: 15.8 g/dL (ref 13.0–17.7)
Immature Grans (Abs): 0 10*3/uL (ref 0.0–0.1)
Immature Granulocytes: 0 %
Lymphocytes Absolute: 2.1 10*3/uL (ref 0.7–3.1)
Lymphs: 34 %
MCH: 32.8 pg (ref 26.6–33.0)
MCHC: 33.9 g/dL (ref 31.5–35.7)
MCV: 97 fL (ref 79–97)
Monocytes Absolute: 0.9 10*3/uL (ref 0.1–0.9)
Monocytes: 14 %
Neutrophils Absolute: 2.9 10*3/uL (ref 1.4–7.0)
Neutrophils: 46 %
Platelets: 212 10*3/uL (ref 150–450)
RBC: 4.82 x10E6/uL (ref 4.14–5.80)
RDW: 11.8 % (ref 11.6–15.4)
WBC: 6.3 10*3/uL (ref 3.4–10.8)

## 2019-12-16 LAB — LIPID PANEL
Chol/HDL Ratio: 4 ratio (ref 0.0–5.0)
Cholesterol, Total: 175 mg/dL (ref 100–199)
HDL: 44 mg/dL (ref >39)
LDL Chol Calc (NIH): 73 mg/dL (ref 0–99)
Triglycerides: 366 mg/dL — ABNORMAL HIGH (ref 0–149)
VLDL Cholesterol Cal: 58 mg/dL — ABNORMAL HIGH (ref 5–40)

## 2019-12-16 MED ORDER — HYDROCODONE-ACETAMINOPHEN 10-325 MG PO TABS: 1.0000 | ORAL_TABLET | Freq: Three times a day (TID) | ORAL | 0 refills | Status: DC | PRN

## 2019-12-16 MED ORDER — ALLOPURINOL 300 MG PO TABS: 300.0000 mg | ORAL_TABLET | Freq: Every day | ORAL | 1 refills | Status: DC

## 2019-12-16 MED ORDER — LISINOPRIL 40 MG PO TABS: 40.0000 mg | ORAL_TABLET | Freq: Every day | ORAL | 0 refills | Status: DC

## 2019-12-16 MED ORDER — AMLODIPINE BESYLATE 10 MG PO TABS: 5.0000 mg | ORAL_TABLET | Freq: Every day | ORAL | 0 refills | Status: DC

## 2019-12-16 MED ORDER — ATORVASTATIN CALCIUM 20 MG PO TABS: 20.0000 mg | ORAL_TABLET | Freq: Every day | ORAL | 0 refills | Status: DC

## 2019-12-16 NOTE — Patient Instructions (Signed)

## 2019-12-16 NOTE — Progress Notes (Signed)
Subjective:    Patient ID: Donald Butler, male    DOB: 27-Jan-1961, 59 y.o.   MRN: [id number]   Chief Complaint: Medical Management of Chronic Issues    HPI:  1. Essential hypertension No c/o chest pain, sob or headache.does not check blood pressure at h ome. BP Readings from Last 3 Encounters:  07/20/19 134/80  05/03/19 124/82  04/15/19 116/84     2. Mixed hyperlipidemia Does not watch diet very closely and does very little exercise. Lab Results  Component Value Date   CHOL 158 05/13/2018   HDL 48 05/13/2018   LDLCALC 53 05/13/2018   TRIG 287 (H) 05/13/2018   CHOLHDL 3.3 05/13/2018     3. Gastroesophageal reflux disease, unspecified whether esophagitis present Is on omeprazole daily and works well to keep symptoms under control.  4. Diverticulosis No recent flare ups.   5. Gout, unspecified cause, unspecified chronicity, unspecified site Is on allopurinol daily. Had flare up 3 months ao in right hand.  6. 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 He has intermitent pain. Walk on cane for many years but that is better now. Pain assessment: Cause of pain- preoneal nerve injury Pain location- left leg Pain on scale of 1-10- 0/10 currently Frequency- daily intermittent What increases pain-walking increases pain What makes pain Better-rest  Or sitting Effects on ADL - none Any change in general medical condition-none  Current opioids rx- norco 10/325 # meds rx- 30- rarely takes but needs on hand when pain is bad Effectiveness of current meds-helps when needed Adverse reactions form pain meds-none Morphine equivalent- 30MEDD- last rx of #30 lasted him clos eto 9 months  Pill count performed-No Last drug screen - never ( high risk q36m moderate risk q667mlow risk yearly ) Urine drug screen today- No Was the [place]- yes  If yes were their any concerning findings? - no   Overdose risk: 0    Pain contract signed  on: 02/14/19   7. MOrbid obesity No recent weight changes Wt Readings from Last 3 Encounters:  12/16/19 247 lb 2 oz (112.1 kg)  07/20/19 249 lb (112.9 kg)  05/03/19 250 lb (113.4 kg)   BMI Readings from Last 3 Encounters:  12/16/19 36.49 kg/m  07/20/19 36.77 kg/m  05/03/19 38.01 kg/m     Outpatient Encounter Medications as of 12/16/2019  Medication Sig  . albuterol (PROVENTIL HFA;VENTOLIN HFA) 108 (90 Base) MCG/ACT inhaler Inhale 2 puffs into the lungs every 6 (six) hours as needed for wheezing or shortness of breath.  . allopurinol (ZYLOPRIM) 300 MG tablet Take 1 tablet by mouth once daily  . amLODipine (NORVASC) 10 MG tablet Take 0.5 tablets (5 mg total) by mouth daily. Must be seen for any further refills  . aspirin EC 81 MG tablet Take 81 mg by mouth daily.  . Marland Kitchentorvastatin (LIPITOR) 20 MG tablet Take 1 tablet (20 mg total) by mouth daily. Must be seen for any further refills  . Cyanocobalamin (VITAMIN B-12 PO) Take 5,000 mcg by mouth daily.  . Marland KitchenYDROcodone-acetaminophen (NORCO) 10-325 MG tablet Take 1 tablet by mouth every 8 (eight) hours as needed.  . Mar[place] Kitchenisinopril (ZESTRIL) 40 MG tablet Take 1 tablet (40 mg total) by mouth daily. Must be seen for any further refills  . loratadine (CLARITIN) 10 MG tablet Take 10 mg by mouth daily.  . Multiple Vitamin (MULTIVITAMIN) tablet Take 1 tablet by mouth daily.  . Marland Kitchenmeprazole (PRILOSEC OTC) 20 MG tablet  Take 40 mg by mouth daily.    Past Surgical History:  Procedure Laterality Date  . [place]   ruptured disk  x 2  . CARPAL TUNNEL RELEASE Left   . COLONOSCOPY    . EXTERNAL EAR SURGERY    . KNEE ARTHROSCOPY  2006, 2008   x 2  left  . MASTOIDECTOMY  2013   right  . OPEN ANTERIOR SHOULDER RECONSTRUCTION  1970   right  . POLYPECTOMY    . reconstruction left leg  2009   multiple surgery after accident    Family History  Problem Relation Age of Onset  . Diabetes Father   . Colon cancer Neg Hx   . Stomach cancer  Neg Hx   . Esophageal cancer Neg Hx   . Rectal cancer Neg Hx   . Pancreatic cancer Neg Hx     New complaints: none  Social history: lives with wife     Review of Systems  Constitutional: Negative for diaphoresis.  Eyes: Negative for pain.  Respiratory: Negative for shortness of breath.   Cardiovascular: Negative for chest pain, palpitations and leg swelling.  Gastrointestinal: Negative for abdominal pain.  Endocrine: Negative for polydipsia.  Skin: Negative for rash.  Neurological: Negative for dizziness, weakness and headaches.  Hematological: Does not bruise/bleed easily.  All other systems reviewed and are negative.      Objective:   Physical Exam Vitals and nursing note reviewed.  Constitutional:      Appearance: Normal appearance. He is well-developed.  HENT:     Head: Normocephalic.     Nose: Nose normal.  Eyes:     Pupils: Pupils are equal, round, and reactive to light.  Neck:     Thyroid: No thyroid mass or thyromegaly.     Vascular: No carotid bruit or JVD.     Trachea: Phonation normal.  Cardiovascular:     Rate and Rhythm: Normal rate and regular rhythm.  Pulmonary:     Effort: Pulmonary effort is normal. No respiratory distress.     Breath sounds: Normal breath sounds.  Abdominal:     General: Bowel sounds are normal.     Palpations: Abdomen is soft.     Tenderness: There is no abdominal tenderness.  Musculoskeletal:        General: Normal range of motion.     Cervical back: Normal range of motion and neck supple.  Lymphadenopathy:     Cervical: No cervical adenopathy.  Skin:    General: Skin is warm and dry.  Neurological:     Mental Status: He is alert and oriented to person, place, and time.  Psychiatric:        Behavior: Behavior normal.        Thought Content: Thought content normal.        Judgment: Judgment normal.     BP 131/75   Pulse 88   Temp 98.3 F (36.8 C) (Temporal)   Ht 5' 9"  (1.753 m)   Wt 247 lb 2 oz (112.1 kg)    BMI 36.49 kg/m        Assessment & Plan:  Donald Butler comes in today with chief complaint of Medical Management of Chronic Issues   Diagnosis and orders addressed:  1. Essential hypertension Low sodium diet - amLODipine (NORVASC) 10 MG tablet; Take 0.5 tablets (5 mg total) by mouth daily. Must be seen for any further refills  Dispense: 90 tablet; Refill: 0 - lisinopril (ZESTRIL) 40  MG tablet; Take 1 tablet (40 mg total) by mouth daily. Must be seen for any further refills  Dispense: 90 tablet; Refill: 0 - CMP14+EGFR - CBC with Differential/Platelet  2. Mixed hyperlipidemia Low fat diet - atorvastatin (LIPITOR) 20 MG tablet; Take 1 tablet (20 mg total) by mouth daily. Must be seen for any further refills  Dispense: 90 tablet; Refill: 0 - Lipid panel  3. Gastroesophageal reflux disease, unspecified whether esophagitis present Avoid spicy foods Do not eat 2 hours prior to bedtime  4. Diverticulosis Watch diet to prevent flare up  5. Gout, unspecified cause, unspecified chronicity, unspecified site Low purine diet - allopurinol (ZYLOPRIM) 300 MG tablet; Take 1 tablet (300 mg total) by mouth daily.  Dispense: 90 tablet; Refill: 1  6. Morbid obesity ([place]) Discussed diet and exercise for person with BMI >25 Will recheck weight in 3-6 months  7. Compression of common peroneal nerve, left Limit use of pain medication - HYDROcodone-acetaminophen (NORCO) 10-325 MG tablet; Take 1 tablet by mouth every 8 (eight) hours as needed.  Dispense: 30 tablet; Refill: 0   Labs pending Health Maintenance reviewed Diet and exercise encouraged  Follow up plan: 6 months   [name] Done, FNP

## 2019-12-20 NOTE — Chronic Care Management (AMB) (Signed)
  Chronic Care Management   Outreach Note  12/20/2019 Name: Donald Butler MRN: [id number] DOB: [birth year]  Donald Butler is a 59 y.o. year old male who is a primary care patient of Chevis Pretty, [place]. I reached out to Donald Butler by phone today in response to a referral sent by [name]'s health plan.     A second unsuccessful telephone outreach was attempted today. The patient was referred to the case management team for assistance with care management and care coordination.   Follow Up Plan: A HIPPA compliant phone message was left for the patient providing contact information and requesting a return call.  The care management team will reach out to the patient again over the next 7 days.  If patient returns call to provider office, please advise to call [facility]  at [place], [place], [place], [place] [address] Direct Dial: [phone] [email or web address] Website: [email or web address]

## 2019-12-22 ENCOUNTER — Encounter: Payer: Self-pay | Admitting: *Deleted

## 2019-12-22 NOTE — Chronic Care Management (AMB) (Signed)
  Chronic Care Management   Outreach Note  12/22/2019 Name: KENDYLL ANDRESKI MRN: [id number] DOB: 1960/09/13  [name] is a 59 y.o. year old male who is a primary care patient of Chevis Pretty, [place]. I reached out to [name] by phone today in response to a referral sent by Mr. Angeline Slim [place]'s health plan.     Third unsuccessful telephone outreach was attempted today. The patient was referred to the case management team for assistance with care management and care coordination. The patient's primary care provider has been notified of our unsuccessful attempts to make or maintain contact with the patient. The care management team is pleased to engage with this patient at any time in the future should he/she be interested in assistance from the care management team.   Follow Up Plan: The care management team is available to follow up with the patient after provider conversation with the patient regarding recommendation for care management engagement and subsequent re-referral to the care management team.   Noreene Larsson, [place], [place], [place] [address] Direct Dial: [phone] [email or web address] [email or web address]

## 2020-01-03 ENCOUNTER — Telehealth: Payer: Self-pay | Admitting: Nurse Practitioner

## 2020-01-03 MED ORDER — FENOFIBRATE 145 MG PO TABS: 145.0000 mg | ORAL_TABLET | Freq: Every day | ORAL | 5 refills | Status: DC

## 2020-01-03 NOTE — Telephone Encounter (Signed)
Left message that rx was sent to the pharmacy  

## 2020-01-03 NOTE — Telephone Encounter (Signed)
Fenofibrate rx sent to pharmacy

## 2020-01-03 NOTE — Telephone Encounter (Signed)
Patient states that he will take the fenofibrate for his triglycerides.

## 2020-03-01 ENCOUNTER — Encounter: Payer: Self-pay | Admitting: Physician Assistant

## 2020-03-01 ENCOUNTER — Other Ambulatory Visit: Payer: Self-pay

## 2020-03-01 ENCOUNTER — Ambulatory Visit (INDEPENDENT_AMBULATORY_CARE_PROVIDER_SITE_OTHER): Payer: Medicare Other | Admitting: Physician Assistant

## 2020-03-01 VITALS — BP 143/83 | HR 87 | Temp 98.1°F | Resp 20 | Ht 69.0 in | Wt 255.0 lb

## 2020-03-01 DIAGNOSIS — B029 Zoster without complications: Secondary | ICD-10-CM

## 2020-03-01 MED ORDER — AMLODIPINE BESYLATE 10 MG PO TABS: 10.0000 mg | ORAL_TABLET | Freq: Every day | ORAL | 0 refills | Status: DC

## 2020-03-01 MED ORDER — VALACYCLOVIR HCL 1 G PO TABS
1000.0000 mg | ORAL_TABLET | Freq: Three times a day (TID) | ORAL | 0 refills | Status: DC
Start: 2020-03-01 — End: 2020-10-23

## 2020-03-01 NOTE — Progress Notes (Signed)
  Subjective:     Patient ID: Donald Butler, male   DOB: [birth year], 59 y.o.   MRN: [id number]  HPI Pt with painful rash to the R lower cheek area since Sat Using OTC meds for sx   Review of Systems  Constitutional: Positive for fatigue. Negative for activity change, chills and fever.  Skin: Positive for rash.       Objective:   Physical Exam Vitals and nursing note reviewed.  Constitutional:      Appearance: He is normal weight.  Neurological:     Mental Status: He is alert.   + vesicled rash to the R lower cheek area No current drainage + sl surrounding erythema + R ant and post cerv nodes     Assessment:     1. Herpes zoster without complication        Plan:     Valtrex 1gm tid x 1 week rx Nl course reviewed Hold topical meds F/U prn

## 2020-03-01 NOTE — Patient Instructions (Signed)
Shingles  Shingles is an infection. It gives you a painful skin rash and blisters that have fluid in them. Shingles is caused by the same germ (virus) that causes chickenpox. Shingles only happens in people who:  Have had chickenpox.  Have been given a shot of medicine (vaccine) to protect against chickenpox. Shingles is rare in this group. The first symptoms of shingles may be itching, tingling, or pain in an area on your skin. A rash will show on your skin a few days or weeks later. The rash is likely to be on one side of your body. The rash usually has a shape like a belt or a band. Over time, the rash turns into fluid-filled blisters. The blisters will break open, change into scabs, and dry up. Medicines may:  Help with pain and itching.  Help you get better sooner.  Help to prevent long-term problems. Follow these instructions at home: Medicines  Take over-the-counter and prescription medicines only as told by your doctor.  Put on an anti-itch cream or numbing cream where you have a rash, blisters, or scabs. Do this as told by your doctor. Helping with itching and discomfort   Put cold, wet cloths (cold compresses) on the area of the rash or blisters as told by your doctor.  Cool baths can help you feel better. Try adding baking soda or dry oatmeal to the water to lessen itching. Do not bathe in hot water. Blister and rash care  Keep your rash covered with a loose bandage (dressing).  Wear loose clothing that does not rub on your rash.  Keep your rash and blisters clean. To do this, wash the area with mild soap and cool water as told by your doctor.  Check your rash every day for signs of infection. Check for: ? More redness, swelling, or pain. ? Fluid or blood. ? Warmth. ? Pus or a bad smell.  Do not scratch your rash. Do not pick at your blisters. To help you to not scratch: ? Keep your fingernails clean and cut short. ? Wear gloves or mittens when you sleep, if  scratching is a problem. General instructions  Rest as told by your doctor.  Keep all follow-up visits as told by your doctor. This is important.  Wash your hands often with soap and water. If soap and water are not available, use hand sanitizer. Doing this lowers your chance of getting a skin infection caused by germs (bacteria).  Your infection can cause chickenpox in people who have never had chickenpox or never got a shot of chickenpox vaccine. If you have blisters that did not change into scabs yet, try not to touch other people or be around other people, especially: ? Babies. ? Pregnant women. ? Children who have areas of red, itchy, or rough skin (eczema). ? Very old people who have transplants. ? People who have a long-term (chronic) sickness, like cancer or AIDS. Contact a doctor if:  Your pain does not get better with medicine.  Your pain does not get better after the rash heals.  You have any signs of infection in the rash area. These signs include: ? More redness, swelling, or pain around the rash. ? Fluid or blood coming from the rash. ? The rash area feeling warm to the touch. ? Pus or a bad smell coming from the rash. Get help right away if:  The rash is on your face or nose.  You have pain in your face or pain by   your eye.  You lose feeling on one side of your face.  You have trouble seeing.  You have ear pain, or you have ringing in your ear.  You have a loss of taste.  Your condition gets worse. Summary  Shingles gives you a painful skin rash and blisters that have fluid in them.  Shingles is an infection. It is caused by the same germ (virus) that causes chickenpox.  Keep your rash covered with a loose bandage (dressing). Wear loose clothing that does not rub on your rash.  If you have blisters that did not change into scabs yet, try not to touch other people or be around people. This information is not intended to replace advice given to you by  your health care provider. Make sure you discuss any questions you have with your health care provider. Document Revised: 11/26/2018 Document Reviewed: 04/08/2017 Elsevier Patient Education  2020 [employer]  

## 2020-04-05 ENCOUNTER — Other Ambulatory Visit: Payer: Self-pay | Admitting: Nurse Practitioner

## 2020-04-05 DIAGNOSIS — I10 Essential (primary) hypertension: Secondary | ICD-10-CM

## 2020-04-09 ENCOUNTER — Ambulatory Visit (INDEPENDENT_AMBULATORY_CARE_PROVIDER_SITE_OTHER): Payer: Medicare Other

## 2020-04-09 ENCOUNTER — Ambulatory Visit (INDEPENDENT_AMBULATORY_CARE_PROVIDER_SITE_OTHER): Payer: Medicare Other | Admitting: Family Medicine

## 2020-04-09 ENCOUNTER — Other Ambulatory Visit: Payer: Self-pay

## 2020-04-09 ENCOUNTER — Encounter: Payer: Self-pay | Admitting: Family Medicine

## 2020-04-09 VITALS — BP 154/97 | HR 110 | Temp 97.6°F | Resp 20 | Ht 69.0 in | Wt 244.5 lb

## 2020-04-09 DIAGNOSIS — R197 Diarrhea, unspecified: Secondary | ICD-10-CM

## 2020-04-09 DIAGNOSIS — R109 Unspecified abdominal pain: Secondary | ICD-10-CM

## 2020-04-09 DIAGNOSIS — R1084 Generalized abdominal pain: Secondary | ICD-10-CM

## 2020-04-09 DIAGNOSIS — R112 Nausea with vomiting, unspecified: Secondary | ICD-10-CM

## 2020-04-09 DIAGNOSIS — R1110 Vomiting, unspecified: Secondary | ICD-10-CM

## 2020-04-09 LAB — URINALYSIS, COMPLETE
Bilirubin, UA: NEGATIVE
Glucose, UA: NEGATIVE
Ketones, UA: NEGATIVE
Leukocytes,UA: NEGATIVE
Nitrite, UA: NEGATIVE
Specific Gravity, UA: >1.03 — ABNORMAL HIGH (ref 1.005–1.030)
Urobilinogen, Ur: 0.2 mg/dL (ref 0.2–1.0)
pH, UA: 5.5 (ref 5.0–7.5)

## 2020-04-09 LAB — MICROSCOPIC EXAMINATION
Bacteria, UA: NONE SEEN
Epithelial Cells (non renal): NONE SEEN /hpf (ref 0–10)
RBC, Urine: NONE SEEN /hpf (ref 0–2)
WBC, UA: NONE SEEN /hpf (ref 0–5)

## 2020-04-09 MED ORDER — METRONIDAZOLE 500 MG PO TABS: 500.0000 mg | ORAL_TABLET | Freq: Two times a day (BID) | ORAL | 0 refills | Status: DC

## 2020-04-09 MED ORDER — CIPROFLOXACIN HCL 500 MG PO TABS: 500.0000 mg | ORAL_TABLET | Freq: Two times a day (BID) | ORAL | 0 refills | Status: DC

## 2020-04-09 NOTE — Progress Notes (Signed)
Subjective:  Patient ID: Donald Butler, male    DOB: [birth year]  Age: 59 y.o. MRN: [id number]  CC: Vomiting, diarrhea   HPI Donald Butler presents for nausea and diarrhea for about 2 months.  For the last several days he is also been vomiting.  He averages vomiting about 3 times a day.  His stools are normal and solid in the mornings first thing.  However, subsequently he will have to go to the bathroom about 9-12 times a day.  The stools are watery and yellow.  The morning stool that is formed can be black or green.  He is also having what he describes as a fiery belching which is quite frequently.  He also reports a sulfur taste in his mouth for the last couple of months.  Depression screen [facility] 2/9 04/09/2020 12/16/2019 02/09/2019  Decreased Interest 0 0 0  Down, Depressed, Hopeless 0 0 0  PHQ - 2 Score 0 0 0    History [name] has a past medical history of Allergy, GERD (gastroesophageal reflux disease), Gout, Hyperlipidemia, Hypertension, Internal hemorrhoid, Nerve damage, and Neuromuscular disorder ([place]).   He has a past surgical history that includes Back surgery (1982, 1983); Open anterior shoulder reconstruction (1970); Knee arthroscopy (2006, 2008); reconstruction left leg (2009); Mastoidectomy (2013); Carpal tunnel release (Left); Colonoscopy; Polypectomy; and External ear surgery.   His family history includes Diabetes in his father.He reports that he quit smoking about 31 years ago. He quit smokeless tobacco use about 7 years ago.  His smokeless tobacco use included chew. He reports current alcohol use of about 21.0 standard drinks of alcohol per week. He reports that he does not use drugs.    ROS Review of Systems  Constitutional: Negative for chills, diaphoresis, fever and unexpected weight change.  HENT: Negative for rhinorrhea and trouble swallowing.   Respiratory: Negative for cough, chest tightness and shortness of breath.   Cardiovascular: Negative for chest pain.    Gastrointestinal: Positive for abdominal pain. Negative for abdominal distention, blood in stool, constipation, diarrhea, nausea, rectal pain and vomiting.  Genitourinary: Negative for dysuria, flank pain and hematuria.  Musculoskeletal: Negative for arthralgias and joint swelling.  Skin: Negative for rash.  Neurological: Negative for syncope and headaches.    Objective:  BP (!) 154/97   Pulse (!) 110   Temp 97.6 F (36.4 C) (Temporal)   Resp 20   Ht _0  (1.753 m)   Wt 244 lb 8 oz (110.9 kg)   SpO2 96%   BMI 36.11 kg/m   BP Readings from Last 3 Encounters:  04/09/20 (!) 154/97  03/01/20 (!) 143/83  12/16/19 131/75    Wt Readings from Last 3 Encounters:  04/09/20 244 lb 8 oz (110.9 kg)  03/01/20 255 lb (115.7 kg)  12/16/19 247 lb 2 oz (112.1 kg)     Physical Exam Vitals reviewed.  Constitutional:      Appearance: He is well-developed.  HENT:     Head: Normocephalic and atraumatic.     Right Ear: Tympanic membrane and external ear normal. No decreased hearing noted.     Left Ear: Tympanic membrane and external ear normal. No decreased hearing noted.     Mouth/Throat:     Pharynx: No oropharyngeal exudate or posterior oropharyngeal erythema.  Eyes:     Pupils: Pupils are equal, round, and reactive to light.  Cardiovascular:     Rate and Rhythm: Normal rate and regular rhythm.     Heart sounds: No  murmur heard.   Pulmonary:     Effort: No respiratory distress.     Breath sounds: Normal breath sounds.  Abdominal:     General: Bowel sounds are normal.     Palpations: Abdomen is soft. There is no mass.     Tenderness: There is abdominal tenderness (Diffuse).  Musculoskeletal:     Cervical back: Normal range of motion and neck supple.    X-ray shows nonspecific bowel gas pattern nonacute.Preliminary reading done by Randell Loop    Assessment & Plan:   Jermine was seen today for vomiting, diarrhea.  Diagnoses and all orders for this visit:  Combined  abdominal pain, vomiting, and diarrhea -     Amylase -     CBC with Differential/Platelet -     Cdiff NAA+O+P+Stool Culture -     CMP14+EGFR -     DG Abd 2 Views; Future -     Fecal occult blood, imunochemical -     Giardia/Cryptosporidium EIA -     Lipase -     Urinalysis, Complete  Generalized abdominal pain -     CT ABDOMEN PELVIS W CONTRAST; Future  Intractable vomiting with nausea, unspecified vomiting type -     CT ABDOMEN PELVIS W CONTRAST; Future  Other orders -     metroNIDAZOLE (FLAGYL) 500 MG tablet; Take 1 tablet (500 mg total) by mouth 2 (two) times daily. -     ciprofloxacin (CIPRO) 500 MG tablet; Take 1 tablet (500 mg total) by mouth 2 (two) times daily. -     Microscopic Examination       I am having Raquon K. Mulvehill "Lanny Hurst" start on metroNIDAZOLE and ciprofloxacin. I am also having him maintain his multivitamin, loratadine, albuterol, omeprazole, aspirin EC, atorvastatin, allopurinol, HYDROcodone-acetaminophen, fenofibrate, amLODipine, valACYclovir, and lisinopril.  Allergies as of 04/09/2020      Reactions   Penicillins Hives, Shortness Of Breath, Swelling      Medication List       Accurate as of April 09, 2020  6:37 PM. If you have any questions, ask your nurse or doctor.        albuterol 108 (90 Base) MCG/ACT inhaler Commonly known as: VENTOLIN HFA Inhale 2 puffs into the lungs every 6 (six) hours as needed for wheezing or shortness of breath.   allopurinol 300 MG tablet Commonly known as: ZYLOPRIM Take 1 tablet (300 mg total) by mouth daily.   amLODipine 10 MG tablet Commonly known as: NORVASC Take 1 tablet (10 mg total) by mouth daily.   aspirin EC 81 MG tablet Take 81 mg by mouth daily.   atorvastatin 20 MG tablet Commonly known as: LIPITOR Take 1 tablet (20 mg total) by mouth daily. Must be seen for any further refills   ciprofloxacin 500 MG tablet Commonly known as: Cipro Take 1 tablet (500 mg total) by mouth 2 (two) times  daily. Started by: Claretta Fraise, MD   fenofibrate 145 MG tablet Commonly known as: TRICOR Take 1 tablet (145 mg total) by mouth daily.   HYDROcodone-acetaminophen 10-325 MG tablet Commonly known as: NORCO Take 1 tablet by mouth every 8 (eight) hours as needed.   lisinopril 40 MG tablet Commonly known as: ZESTRIL TAKE 1 TABLET BY MOUTH ONCE DAILY . APPOINTMENT REQUIRED FOR FUTURE REFILLS   loratadine 10 MG tablet Commonly known as: CLARITIN Take 10 mg by mouth daily.   metroNIDAZOLE 500 MG tablet Commonly known as: FLAGYL Take 1 tablet (500 mg total) by mouth 2 (  two) times daily. Started by: Claretta Fraise, MD   multivitamin tablet Take 1 tablet by mouth daily.   omeprazole 20 MG tablet Commonly known as: PRILOSEC OTC Take 40 mg by mouth daily.   valACYclovir 1000 MG tablet Commonly known as: VALTREX Take 1 tablet (1,000 mg total) by mouth 3 (three) times daily.        Follow-up: Return in about 1 week (around 04/16/2020), or if symptoms worsen or fail to improve.  Claretta Fraise, M.D.

## 2020-04-10 ENCOUNTER — Other Ambulatory Visit: Payer: Medicare Other

## 2020-04-10 DIAGNOSIS — R197 Diarrhea, unspecified: Secondary | ICD-10-CM | POA: Diagnosis not present

## 2020-04-10 DIAGNOSIS — R109 Unspecified abdominal pain: Secondary | ICD-10-CM | POA: Diagnosis not present

## 2020-04-10 DIAGNOSIS — R1110 Vomiting, unspecified: Secondary | ICD-10-CM | POA: Diagnosis not present

## 2020-04-10 LAB — CMP14+EGFR
ALT: 90 IU/L — ABNORMAL HIGH (ref 0–44)
AST: 62 IU/L — ABNORMAL HIGH (ref 0–40)
Albumin/Globulin Ratio: 1.6 (ref 1.2–2.2)
Albumin: 4.6 g/dL (ref 3.8–4.9)
Alkaline Phosphatase: 74 IU/L (ref 48–121)
BUN/Creatinine Ratio: 22 (ref 10–24)
BUN: 22 mg/dL (ref 8–27)
Bilirubin Total: 0.8 mg/dL (ref 0.0–1.2)
CO2: 20 mmol/L (ref 20–29)
Calcium: 9.6 mg/dL (ref 8.6–10.2)
Chloride: 99 mmol/L (ref 96–106)
Creatinine, Ser: 1 mg/dL (ref 0.76–1.27)
GFR calc Af Amer: 94 mL/min/{1.73_m2} (ref >59)
GFR calc non Af Amer: 81 mL/min/{1.73_m2} (ref >59)
Globulin, Total: 2.8 g/dL (ref 1.5–4.5)
Glucose: 123 mg/dL — ABNORMAL HIGH (ref 65–99)
Potassium: 4 mmol/L (ref 3.5–5.2)
Sodium: 136 mmol/L (ref 134–144)
Total Protein: 7.4 g/dL (ref 6.0–8.5)

## 2020-04-10 LAB — CBC WITH DIFFERENTIAL/PLATELET
Basophils Absolute: 0.1 10*3/uL (ref 0.0–0.2)
Basos: 1 %
EOS (ABSOLUTE): 0.2 10*3/uL (ref 0.0–0.4)
Eos: 2 %
Hematocrit: 46.1 % (ref 37.5–51.0)
Hemoglobin: 16.2 g/dL (ref 13.0–17.7)
Immature Grans (Abs): 0 10*3/uL (ref 0.0–0.1)
Immature Granulocytes: 0 %
Lymphocytes Absolute: 2.1 10*3/uL (ref 0.7–3.1)
Lymphs: 28 %
MCH: 33.6 pg — ABNORMAL HIGH (ref 26.6–33.0)
MCHC: 35.1 g/dL (ref 31.5–35.7)
MCV: 96 fL (ref 79–97)
Monocytes Absolute: 0.8 10*3/uL (ref 0.1–0.9)
Monocytes: 11 %
Neutrophils Absolute: 4.4 10*3/uL (ref 1.4–7.0)
Neutrophils: 58 %
Platelets: 226 10*3/uL (ref 150–450)
RBC: 4.82 x10E6/uL (ref 4.14–5.80)
RDW: 13.1 % (ref 11.6–15.4)
WBC: 7.5 10*3/uL (ref 3.4–10.8)

## 2020-04-10 LAB — AMYLASE: Amylase: 35 U/L (ref 31–110)

## 2020-04-10 LAB — LIPASE: Lipase: 32 U/L (ref 13–78)

## 2020-04-11 ENCOUNTER — Other Ambulatory Visit: Payer: Self-pay | Admitting: Family Medicine

## 2020-04-11 DIAGNOSIS — R1110 Vomiting, unspecified: Secondary | ICD-10-CM

## 2020-04-11 LAB — FECAL OCCULT BLOOD, IMMUNOCHEMICAL: Fecal Occult Bld: NEGATIVE

## 2020-04-11 LAB — GIARDIA/CRYPTOSPORIDIUM EIA
Cryptosporidium EIA: NEGATIVE
Giardia Ag, Stl: NEGATIVE

## 2020-04-11 MED ORDER — METRONIDAZOLE 500 MG PO TABS: 500.0000 mg | ORAL_TABLET | Freq: Three times a day (TID) | ORAL | 0 refills | Status: DC

## 2020-04-12 ENCOUNTER — Ambulatory Visit (HOSPITAL_COMMUNITY): Payer: Medicare Other

## 2020-04-13 ENCOUNTER — Ambulatory Visit (HOSPITAL_COMMUNITY): Payer: Medicare Other

## 2020-04-19 LAB — CDIFF NAA+O+P+STOOL CULTURE
E coli, Shiga toxin Assay: NEGATIVE
Toxigenic C. Difficile by PCR: POSITIVE — AB

## 2020-05-04 ENCOUNTER — Telehealth: Payer: Self-pay | Admitting: Family Medicine

## 2020-05-04 ENCOUNTER — Other Ambulatory Visit: Payer: Self-pay

## 2020-05-04 ENCOUNTER — Ambulatory Visit (INDEPENDENT_AMBULATORY_CARE_PROVIDER_SITE_OTHER): Payer: Medicare Other | Admitting: Family Medicine

## 2020-05-04 ENCOUNTER — Encounter: Payer: Self-pay | Admitting: Family Medicine

## 2020-05-04 VITALS — BP 139/87 | HR 97 | Temp 98.2°F | Resp 20 | Ht 69.0 in | Wt 247.0 lb

## 2020-05-04 DIAGNOSIS — A0472 Enterocolitis due to Clostridium difficile, not specified as recurrent: Secondary | ICD-10-CM | POA: Diagnosis not present

## 2020-05-04 MED ORDER — VANCOMYCIN HCL 250 MG PO CAPS: 250.0000 mg | ORAL_CAPSULE | Freq: Four times a day (QID) | ORAL | 0 refills | Status: DC

## 2020-05-04 MED ORDER — OMEPRAZOLE MAGNESIUM 20 MG PO TBEC
40.0000 mg | DELAYED_RELEASE_TABLET | Freq: Two times a day (BID) | ORAL | 2 refills | Status: DC
Start: 2020-05-04 — End: 2020-05-21

## 2020-05-04 MED ORDER — VANCOMYCIN HCL 250 MG PO CAPS
250.0000 mg | ORAL_CAPSULE | Freq: Four times a day (QID) | ORAL | 0 refills | Status: DC
Start: 2020-05-04 — End: 2020-05-21

## 2020-05-04 NOTE — Telephone Encounter (Signed)
I will send the prescription to [facility]. Try to use the good RX card there.

## 2020-05-04 NOTE — Telephone Encounter (Signed)
Pt says he went to the pharmacy to pick up the prescription that Dr Livia Snellen sent in for him today for antibiotic. Pt says he was told by the pharmacist that the medicine was going to cost him $7,000.Marland Kitchen He obviously cant afford that. Wants to know what other options are?

## 2020-05-04 NOTE — Progress Notes (Signed)
Subjective:  Patient ID: JAEDEN MESSER, male    DOB: [birth year]  Age: 59 y.o. MRN: [id number]  CC: Abdominal pain, diarrhea   HPI Donald Butler presents for follow-up on his abdominal discomfort.  He says he feels sick as a dog.  He felt much better while taking the antibiotic metronidazole.  We discontinued the Cipro when his diagnosis came back positive for C. difficile.  Unfortunately the symptoms came back.  He is not having frequent diarrhea but he is having some clear watery bowel movements.  Patient says the symptoms have been going on for about a year and a half in the metronidazole was the first briefly had.  Depression screen [facility] 2/9 05/04/2020 04/09/2020 12/16/2019  Decreased Interest 0 0 0  Down, Depressed, Hopeless 0 0 0  PHQ - 2 Score 0 0 0    History Jakie has a past medical history of Allergy, GERD (gastroesophageal reflux disease), Gout, Hyperlipidemia, Hypertension, Internal hemorrhoid, Nerve damage, and Neuromuscular disorder ([place]).   He has a past surgical history that includes Back surgery (1982, 1983); Open anterior shoulder reconstruction (1970); Knee arthroscopy (2006, 2008); reconstruction left leg (2009); Mastoidectomy (2013); Carpal tunnel release (Left); Colonoscopy; Polypectomy; and External ear surgery.   His family history includes Diabetes in his father.He reports that he quit smoking about 32 years ago. He quit smokeless tobacco use about 7 years ago.  His smokeless tobacco use included chew. He reports current alcohol use of about 21.0 standard drinks of alcohol per week. He reports that he does not use drugs.    ROS Review of Systems  Constitutional: Positive for fatigue. Negative for fever.  Respiratory: Negative for shortness of breath.   Cardiovascular: Negative for chest pain.  Gastrointestinal: Positive for abdominal pain and diarrhea.  Musculoskeletal: Negative for arthralgias.  Skin: Negative for rash.    Objective:  BP 139/87   Pulse 97    Temp 98.2 F (36.8 C) (Temporal)   Resp 20   Ht 5\' 9"  (1.753 m)   Wt 247 lb (112 kg)   SpO2 95%   BMI 36.48 kg/m   BP Readings from Last 3 Encounters:  05/04/20 139/87  04/09/20 (!) 154/97  03/01/20 (!) 143/83    Wt Readings from Last 3 Encounters:  05/04/20 247 lb (112 kg)  04/09/20 244 lb 8 oz (110.9 kg)  03/01/20 255 lb (115.7 kg)     Physical Exam Vitals reviewed.  Constitutional:      Appearance: He is well-developed.  HENT:     Head: Normocephalic and atraumatic.     Right Ear: Tympanic membrane and external ear normal. No decreased hearing noted.     Left Ear: Tympanic membrane and external ear normal. No decreased hearing noted.     Mouth/Throat:     Pharynx: No oropharyngeal exudate or posterior oropharyngeal erythema.  Eyes:     Pupils: Pupils are equal, round, and reactive to light.  Cardiovascular:     Rate and Rhythm: Normal rate and regular rhythm.     Heart sounds: No murmur heard.   Pulmonary:     Effort: No respiratory distress.     Breath sounds: Normal breath sounds.  Abdominal:     General: Bowel sounds are normal.     Palpations: Abdomen is soft. There is no mass.     Tenderness: There is abdominal tenderness (  Diffuse but primarily in both upper quadrants).  Musculoskeletal:     Cervical back: Normal range of motion  and neck supple.       Assessment & Plan:   Reon was seen today for abdominal pain, diarrhea.  Diagnoses and all orders for this visit:  C. difficile colitis  Other orders -     omeprazole (PRILOSEC OTC) 20 MG tablet; Take 2 tablets (40 mg total) by mouth 2 (two) times daily. TAKE ON AN EMPTY STOMACH. THEN WAIT ONE HOUR TO EAT -     Discontinue: vancomycin (VANCOCIN) 250 MG capsule; Take 1 capsule (250 mg total) by mouth 4 (four) times daily. -     vancomycin (VANCOCIN) 250 MG capsule; Take 1 capsule (250 mg total) by mouth 4 (four) times daily.       I have discontinued Antony K. Standlee "Keith"'s ciprofloxacin  and metroNIDAZOLE. I have also changed his omeprazole. Additionally, I am having him maintain his multivitamin, loratadine, albuterol, aspirin EC, atorvastatin, allopurinol, HYDROcodone-acetaminophen, fenofibrate, amLODipine, valACYclovir, lisinopril, and vancomycin.  Allergies as of 05/04/2020      Reactions   Penicillins Hives, Shortness Of Breath, Swelling      Medication List       Accurate as of May 04, 2020  7:25 PM. If you have any questions, ask your nurse or doctor.        STOP taking these medications   ciprofloxacin 500 MG tablet Commonly known as: Cipro Stopped by: Claretta Fraise, MD   metroNIDAZOLE 500 MG tablet Commonly known as: FLAGYL Stopped by: Claretta Fraise, MD     TAKE these medications   albuterol 108 (90 Base) MCG/ACT inhaler Commonly known as: VENTOLIN HFA Inhale 2 puffs into the lungs every 6 (six) hours as needed for wheezing or shortness of breath.   allopurinol 300 MG tablet Commonly known as: ZYLOPRIM Take 1 tablet (300 mg total) by mouth daily.   amLODipine 10 MG tablet Commonly known as: NORVASC Take 1 tablet (10 mg total) by mouth daily.   aspirin EC 81 MG tablet Take 81 mg by mouth daily.   atorvastatin 20 MG tablet Commonly known as: LIPITOR Take 1 tablet (20 mg total) by mouth daily. Must be seen for any further refills   fenofibrate 145 MG tablet Commonly known as: TRICOR Take 1 tablet (145 mg total) by mouth daily.   HYDROcodone-acetaminophen 10-325 MG tablet Commonly known as: NORCO Take 1 tablet by mouth every 8 (eight) hours as needed.   lisinopril 40 MG tablet Commonly known as: ZESTRIL TAKE 1 TABLET BY MOUTH ONCE DAILY . APPOINTMENT REQUIRED FOR FUTURE REFILLS   loratadine 10 MG tablet Commonly known as: CLARITIN Take 10 mg by mouth daily.   multivitamin tablet Take 1 tablet by mouth daily.   omeprazole 20 MG tablet Commonly known as: PRILOSEC OTC Take 2 tablets (40 mg total) by mouth 2 (two) times daily.  TAKE ON AN EMPTY STOMACH. THEN WAIT ONE HOUR TO EAT What changed:   when to take this  additional instructions Changed by: Claretta Fraise, MD   valACYclovir 1000 MG tablet Commonly known as: VALTREX Take 1 tablet (1,000 mg total) by mouth 3 (three) times daily.   vancomycin 250 MG capsule Commonly known as: VANCOCIN Take 1 capsule (250 mg total) by mouth 4 (four) times daily. Started by: Claretta Fraise, MD        Follow-up: Return in about 2 weeks (around 05/18/2020).  Claretta Fraise, M.D.

## 2020-05-04 NOTE — Telephone Encounter (Signed)
Patient aware.

## 2020-05-04 NOTE — Telephone Encounter (Signed)
Please advise 

## 2020-05-21 ENCOUNTER — Other Ambulatory Visit: Payer: Self-pay

## 2020-05-21 ENCOUNTER — Ambulatory Visit (INDEPENDENT_AMBULATORY_CARE_PROVIDER_SITE_OTHER): Payer: Medicare Other | Admitting: Family Medicine

## 2020-05-21 ENCOUNTER — Encounter: Payer: Self-pay | Admitting: Family Medicine

## 2020-05-21 VITALS — BP 167/97 | HR 86 | Temp 97.8°F | Resp 20 | Ht 69.0 in | Wt 248.0 lb

## 2020-05-21 DIAGNOSIS — I10 Essential (primary) hypertension: Secondary | ICD-10-CM

## 2020-05-21 DIAGNOSIS — A0472 Enterocolitis due to Clostridium difficile, not specified as recurrent: Secondary | ICD-10-CM | POA: Diagnosis not present

## 2020-05-21 DIAGNOSIS — M109 Gout, unspecified: Secondary | ICD-10-CM | POA: Diagnosis not present

## 2020-05-21 MED ORDER — VANCOMYCIN HCL 250 MG PO CAPS
250.0000 mg | ORAL_CAPSULE | Freq: Four times a day (QID) | ORAL | 0 refills | Status: DC
Start: 2020-05-21 — End: 2020-06-19

## 2020-05-21 MED ORDER — OMEPRAZOLE MAGNESIUM 20 MG PO TBEC
20.0000 mg | DELAYED_RELEASE_TABLET | Freq: Two times a day (BID) | ORAL | 2 refills | Status: DC
Start: 2020-05-21 — End: 2020-06-19

## 2020-05-21 MED ORDER — ALLOPURINOL 300 MG PO TABS: 150.0000 mg | ORAL_TABLET | Freq: Every day | ORAL | 1 refills | Status: DC

## 2020-05-21 NOTE — Progress Notes (Signed)
Subjective:  Patient ID: Donald Butler, male    DOB: [birth year]  Age: 59 y.o. MRN: [id number]  CC: 2 Week Follow-up   HPI Donald Butler presents for Recheck of C. Diff after 2 weeks of vancocin p.o. he is still having about 3 bowel movements a day.  Sometimes he passes a lot of mucousy clear stuff.  He is not having any abdominal pain.  His appetite is returning.  He thinks that he can decrease the omeprazole safely.  He is also been taken off of his medicines temporarily to make sure that they were not contributing to the diarrhea.  He has not had any gout attacks fortunately.  However, he does express interest in going back on the allopurinol.  His blood pressure is up and he wants to look at resuming his blood pressure medication.  Additionally he is having allergy symptoms and wants to go back on that medication.  Depression screen [facility] 2/9 05/21/2020 05/04/2020 04/09/2020  Decreased Interest 0 0 0  Down, Depressed, Hopeless 0 0 0  PHQ - 2 Score 0 0 0    History Donald Butler has a past medical history of Allergy, GERD (gastroesophageal reflux disease), Gout, Hyperlipidemia, Hypertension, Internal hemorrhoid, Nerve damage, and Neuromuscular disorder ([place]).   He has a past surgical history that includes Back surgery (1982, 1983); Open anterior shoulder reconstruction (1970); Knee arthroscopy (2006, 2008); reconstruction left leg (2009); Mastoidectomy (2013); Carpal tunnel release (Left); Colonoscopy; Polypectomy; and External ear surgery.   His family history includes Diabetes in his father.He reports that he quit smoking about 32 years ago. He quit smokeless tobacco use about 7 years ago.  His smokeless tobacco use included chew. He reports current alcohol use of about 21.0 standard drinks of alcohol per week. He reports that he does not use drugs.    ROS Review of Systems  Constitutional: Negative for chills, diaphoresis, fever and unexpected weight change.  HENT: Positive for congestion,  rhinorrhea and sneezing. Negative for trouble swallowing.   Respiratory: Negative for cough and shortness of breath.   Cardiovascular: Negative for chest pain.  Gastrointestinal: Positive for diarrhea and nausea. Negative for abdominal distention, abdominal pain, blood in stool, constipation, rectal pain and vomiting.  Genitourinary: Negative for flank pain.  Musculoskeletal: Negative for arthralgias and joint swelling.  Skin: Negative for rash.    Objective:  BP (!) 167/97   Pulse 86   Temp 97.8 F (36.6 C) (Temporal)   Resp 20   Ht 5\' 9"  (1.753 m)   Wt 248 lb (112.5 kg)   SpO2 96%   BMI 36.62 kg/m   BP Readings from Last 3 Encounters:  05/21/20 (!) 167/97  05/04/20 139/87  04/09/20 (!) 154/97    Wt Readings from Last 3 Encounters:  05/21/20 248 lb (112.5 kg)  05/04/20 247 lb (112 kg)  04/09/20 244 lb 8 oz (110.9 kg)     Physical Exam    Assessment & Plan:   Minas was seen today for 2 week follow-up.  Diagnoses and all orders for this visit:  C. difficile colitis  Gout, unspecified cause, unspecified chronicity, unspecified site -     allopurinol (ZYLOPRIM) 300 MG tablet; Take 0.5 tablets (150 mg total) by mouth daily.  Primary hypertension  Other orders -     omeprazole (PRILOSEC OTC) 20 MG tablet; Take 1 tablet (20 mg total) by mouth 2 (two) times daily. TAKE ON AN EMPTY STOMACH. THEN WAIT ONE HOUR TO EAT -  vancomycin (VANCOCIN) 250 MG capsule; Take 1 capsule (250 mg total) by mouth 4 (four) times daily.       I have changed Donald Butler "Keith"'s omeprazole and allopurinol. I am also having him maintain his multivitamin, loratadine, albuterol, aspirin EC, atorvastatin, HYDROcodone-acetaminophen, fenofibrate, amLODipine, valACYclovir, lisinopril, and vancomycin.  Allergies as of 05/21/2020      Reactions   Penicillins Hives, Shortness Of Breath, Swelling      Medication List       Accurate as of May 21, 2020  7:51 PM. If you have any  questions, ask your nurse or doctor.        albuterol 108 (90 Base) MCG/ACT inhaler Commonly known as: VENTOLIN HFA Inhale 2 puffs into the lungs every 6 (six) hours as needed for wheezing or shortness of breath.   allopurinol 300 MG tablet Commonly known as: ZYLOPRIM Take 0.5 tablets (150 mg total) by mouth daily. What changed: how much to take Changed by: Claretta Fraise, MD   amLODipine 10 MG tablet Commonly known as: NORVASC Take 1 tablet (10 mg total) by mouth daily.   aspirin EC 81 MG tablet Take 81 mg by mouth daily.   atorvastatin 20 MG tablet Commonly known as: LIPITOR Take 1 tablet (20 mg total) by mouth daily. Must be seen for any further refills   fenofibrate 145 MG tablet Commonly known as: TRICOR Take 1 tablet (145 mg total) by mouth daily.   HYDROcodone-acetaminophen 10-325 MG tablet Commonly known as: NORCO Take 1 tablet by mouth every 8 (eight) hours as needed.   lisinopril 40 MG tablet Commonly known as: ZESTRIL TAKE 1 TABLET BY MOUTH ONCE DAILY . APPOINTMENT REQUIRED FOR FUTURE REFILLS   loratadine 10 MG tablet Commonly known as: CLARITIN Take 10 mg by mouth daily.   multivitamin tablet Take 1 tablet by mouth daily.   omeprazole 20 MG tablet Commonly known as: PRILOSEC OTC Take 1 tablet (20 mg total) by mouth 2 (two) times daily. TAKE ON AN EMPTY STOMACH. THEN WAIT ONE HOUR TO EAT What changed: how much to take Changed by: Claretta Fraise, MD   valACYclovir 1000 MG tablet Commonly known as: VALTREX Take 1 tablet (1,000 mg total) by mouth 3 (three) times daily.   vancomycin 250 MG capsule Commonly known as: VANCOCIN Take 1 capsule (250 mg total) by mouth 4 (four) times daily.      Resume allopurinol, 1/2 tab daily.   Resume amlodipine  1/2 daily for 3-4 days, then increase to a whole tab daily  Resume the loratidine  Take two more weeks of the vancomycin  Decrease the omeprazole to 20 mg twice a day  Follow-up: Return in about 1  month (around 06/21/2020), or if symptoms worsen or fail to improve.  Claretta Fraise, M.D.

## 2020-05-21 NOTE — Patient Instructions (Signed)
Resume allopurinol, 1/2 tab daily.   Resume amlodipine  1/2 daily for 3-4 days, then increase to a whole tab daily  Resume the loratidine  Take two more weeks of the vancomycin  Decrease the omeprazole to 20 mg twice a day

## 2020-05-24 ENCOUNTER — Ambulatory Visit: Payer: Medicare Other | Admitting: Gastroenterology

## 2020-06-19 ENCOUNTER — Ambulatory Visit (INDEPENDENT_AMBULATORY_CARE_PROVIDER_SITE_OTHER): Payer: Medicare Other | Admitting: Nurse Practitioner

## 2020-06-19 ENCOUNTER — Other Ambulatory Visit: Payer: Self-pay

## 2020-06-19 ENCOUNTER — Encounter: Payer: Self-pay | Admitting: Nurse Practitioner

## 2020-06-19 VITALS — BP 154/97 | HR 106 | Temp 98.2°F | Resp 20 | Ht 69.0 in | Wt 251.0 lb

## 2020-06-19 DIAGNOSIS — K219 Gastro-esophageal reflux disease without esophagitis: Secondary | ICD-10-CM | POA: Diagnosis not present

## 2020-06-19 DIAGNOSIS — R350 Frequency of micturition: Secondary | ICD-10-CM | POA: Diagnosis not present

## 2020-06-19 DIAGNOSIS — M109 Gout, unspecified: Secondary | ICD-10-CM | POA: Diagnosis not present

## 2020-06-19 DIAGNOSIS — K5790 Diverticulosis of intestine, part unspecified, without perforation or abscess without bleeding: Secondary | ICD-10-CM | POA: Diagnosis not present

## 2020-06-19 DIAGNOSIS — I10 Essential (primary) hypertension: Secondary | ICD-10-CM | POA: Diagnosis not present

## 2020-06-19 DIAGNOSIS — E782 Mixed hyperlipidemia: Secondary | ICD-10-CM | POA: Diagnosis not present

## 2020-06-19 DIAGNOSIS — G5702 Lesion of sciatic nerve, left lower limb: Secondary | ICD-10-CM | POA: Diagnosis not present

## 2020-06-19 LAB — LIPID PANEL

## 2020-06-19 MED ORDER — HYDROCODONE-ACETAMINOPHEN 10-325 MG PO TABS: 1.0000 | ORAL_TABLET | Freq: Three times a day (TID) | ORAL | 0 refills | Status: DC | PRN

## 2020-06-19 MED ORDER — AMLODIPINE BESYLATE 10 MG PO TABS: 10.0000 mg | ORAL_TABLET | Freq: Every day | ORAL | 0 refills | Status: DC

## 2020-06-19 MED ORDER — LISINOPRIL 40 MG PO TABS: ORAL_TABLET | ORAL | 1 refills | Status: DC

## 2020-06-19 MED ORDER — FENOFIBRATE 145 MG PO TABS: 145.0000 mg | ORAL_TABLET | Freq: Every day | ORAL | 5 refills | Status: DC

## 2020-06-19 MED ORDER — OMEPRAZOLE MAGNESIUM 20 MG PO TBEC: 20.0000 mg | DELAYED_RELEASE_TABLET | Freq: Two times a day (BID) | ORAL | 2 refills | Status: DC

## 2020-06-19 MED ORDER — ATORVASTATIN CALCIUM 20 MG PO TABS: 20.0000 mg | ORAL_TABLET | Freq: Every day | ORAL | 0 refills | Status: DC

## 2020-06-19 NOTE — Progress Notes (Signed)
Subjective:    Patient ID: Donald Butler, male    DOB: 1960/12/03, 59 y.o.   MRN: [id number]   Chief Complaint: Medical Management of Chronic Issues    HPI:  1. Primary hypertension No c/o chest pain, sob or headaches. He does not check his blood pressure at home. Dr. Livia Snellen stooped all of his meds due to colitis. He has only started back on amlodipine. Was on lisinorpil with amlodipine. BP Readings from Last 3 Encounters:  06/19/20 (!) 154/97  05/21/20 (!) 167/97  05/04/20 139/87     2. Mixed hyperlipidemia Does not watch diet and does little to no exercise. Lab Results  Component Value Date   CHOL 175 12/16/2019   HDL 44 12/16/2019   LDLCALC 73 12/16/2019   TRIG 366 (H) 12/16/2019   CHOLHDL 4.0 12/16/2019     3. Gastroesophageal reflux disease, unspecified whether esophagitis present Is on omeprazole daily and is doing well.  4. Diverticulosis He has had colitis and has taken antibiotics. He is better now. But Dr. Livia Snellen stooped all of his meds during issues.  5. Gout, unspecified cause, unspecified chronicity, unspecified site Has recently started back on allopurinol  6. Compression of common peroneal nerve, left He is on norco 10/325 and he takes maybe 6-7 times a month. He uses very sparingly   7.  Morbid obesity ([place]) No recent weight changes Wt Readings from Last 3 Encounters:  06/19/20 251 lb (113.9 kg)  05/21/20 248 lb (112.5 kg)  05/04/20 247 lb (112 kg)   BMI Readings from Last 3 Encounters:  06/19/20 37.07 kg/m  05/21/20 36.62 kg/m  05/04/20 36.48 kg/m       Outpatient Encounter Medications as of 06/19/2020  Medication Sig  . albuterol (PROVENTIL HFA;VENTOLIN HFA) 108 (90 Base) MCG/ACT inhaler Inhale 2 puffs into the lungs every 6 (six) hours as needed for wheezing or shortness of breath.  . allopurinol (ZYLOPRIM) 300 MG tablet Take 0.5 tablets (150 mg total) by mouth daily.  Marland Kitchen amLODipine (NORVASC) 10 MG tablet Take 1 tablet (10 mg  total) by mouth daily.  Marland Kitchen aspirin EC 81 MG tablet Take 81 mg by mouth daily.   Marland Kitchen HYDROcodone-acetaminophen (NORCO) 10-325 MG tablet Take 1 tablet by mouth every 8 (eight) hours as needed.  . loratadine (CLARITIN) 10 MG tablet Take 10 mg by mouth daily.   . Multiple Vitamin (MULTIVITAMIN) tablet Take 1 tablet by mouth daily.   Marland Kitchen omeprazole (PRILOSEC OTC) 20 MG tablet Take 1 tablet (20 mg total) by mouth 2 (two) times daily. TAKE ON AN EMPTY STOMACH. THEN WAIT ONE HOUR TO EAT  . atorvastatin (LIPITOR) 20 MG tablet Take 1 tablet (20 mg total) by mouth daily. Must be seen for any further refills (Patient not taking: Reported on 05/21/2020)  . fenofibrate (TRICOR) 145 MG tablet Take 1 tablet (145 mg total) by mouth daily. (Patient not taking: Reported on 05/21/2020)  . lisinopril (ZESTRIL) 40 MG tablet TAKE 1 TABLET BY MOUTH ONCE DAILY . APPOINTMENT REQUIRED FOR FUTURE REFILLS (Patient not taking: Reported on 05/21/2020)  . valACYclovir (VALTREX) 1000 MG tablet Take 1 tablet (1,000 mg total) by mouth 3 (three) times daily. (Patient not taking: Reported on 05/21/2020)  . [DISCONTINUED] vancomycin (VANCOCIN) 250 MG capsule Take 1 capsule (250 mg total) by mouth 4 (four) times daily.   No facility-administered encounter medications on file as of 06/19/2020.    Past Surgical History:  Procedure Laterality Date  . [place]  ruptured disk  x 2  . CARPAL TUNNEL RELEASE Left   . COLONOSCOPY    . EXTERNAL EAR SURGERY    . KNEE ARTHROSCOPY  2006, 2008   x 2  left  . MASTOIDECTOMY  2013   right  . OPEN ANTERIOR SHOULDER RECONSTRUCTION  1970   right  . POLYPECTOMY    . reconstruction left leg  2009   multiple surgery after accident    Family History  Problem Relation Age of Onset  . Diabetes Father   . Colon cancer Neg Hx   . Stomach cancer Neg Hx   . Esophageal cancer Neg Hx   . Rectal cancer Neg Hx   . Pancreatic cancer Neg Hx     New complaints: None today  Social  history: Lives   Controlled substance contract: 06/19/20    Review of Systems  Constitutional: Negative for diaphoresis.  Eyes: Negative for pain.  Respiratory: Negative for shortness of breath.   Cardiovascular: Negative for chest pain, palpitations and leg swelling.  Gastrointestinal: Negative for abdominal pain.  Endocrine: Negative for polydipsia.  Skin: Negative for rash.  Neurological: Negative for dizziness, weakness and headaches.  Hematological: Does not bruise/bleed easily.  All other systems reviewed and are negative.      Objective:   Physical Exam Vitals and nursing note reviewed.  Constitutional:      Appearance: Normal appearance. He is well-developed.  HENT:     Head: Normocephalic.     Nose: Nose normal.  Eyes:     Pupils: Pupils are equal, round, and reactive to light.  Neck:     Thyroid: No thyroid mass or thyromegaly.     Vascular: No carotid bruit or JVD.     Trachea: Phonation normal.  Cardiovascular:     Rate and Rhythm: Normal rate and regular rhythm.  Pulmonary:     Effort: Pulmonary effort is normal. No respiratory distress.     Breath sounds: Normal breath sounds.  Abdominal:     General: Bowel sounds are normal.     Palpations: Abdomen is soft.     Tenderness: There is no abdominal tenderness.  Musculoskeletal:        General: Normal range of motion.     Cervical back: Normal range of motion and neck supple.  Lymphadenopathy:     Cervical: No cervical adenopathy.  Skin:    General: Skin is warm and dry.  Neurological:     Mental Status: He is alert and oriented to person, place, and time.  Psychiatric:        Behavior: Behavior normal.        Thought Content: Thought content normal.        Judgment: Judgment normal.    BP (!) 154/97   Pulse (!) 106   Temp 98.2 F (36.8 C) (Temporal)   Resp 20   Ht 5' 9"  (1.753 m)   Wt 251 lb (113.9 kg)   SpO2 96%   BMI 37.07 kg/m         Assessment & Plan:  ZYKEE AVAKIAN comes in  today with chief complaint of Medical Management of Chronic Issues   Diagnosis and orders addressed:  1. Primary hypertension Low sodium diet - lisinopril (ZESTRIL) 40 MG tablet; TAKE 1 TABLET BY MOUTH ONCE DAILY . APPOINTMENT REQUIRED FOR FUTURE REFILLS  Dispense: 90 tablet; Refill: 1 - amLODipine (NORVASC) 10 MG tablet; Take 1 tablet (10 mg total) by mouth daily.  Dispense: 90 tablet; Refill:  0 - CMP14+EGFR  2. Mixed hyperlipidemia Low fat diet - atorvastatin (LIPITOR) 20 MG tablet; Take 1 tablet (20 mg total) by mouth daily. Must be seen for any further refills  Dispense: 90 tablet; Refill: 0 - fenofibrate (TRICOR) 145 MG tablet; Take 1 tablet (145 mg total) by mouth daily.  Dispense: 30 tablet; Refill: 5 - Lipid panel  3. Gastroesophageal reflux disease, unspecified whether esophagitis present Avoid spicy foods Do not eat 2 hours prior to bedtime - omeprazole (PRILOSEC OTC) 20 MG tablet; Take 1 tablet (20 mg total) by mouth 2 (two) times daily. TAKE ON AN EMPTY STOMACH. THEN WAIT ONE HOUR TO EAT  Dispense: 60 tablet; Refill: 2  4. Diverticulosis Watch diet to prevent flare up - CBC with Differential/Platelet  5. Gout, unspecified cause, unspecified chronicity, unspecified site Continue allopurinol as rx  6. Morbid obesity ([place]) Discussed diet and exercise for person with BMI >25 Will recheck weight in 3-6 months   7. Compression of common peroneal nerve, left Rest when pain occurs - HYDROcodone-acetaminophen (NORCO) 10-325 MG tablet; Take 1 tablet by mouth every 8 (eight) hours as needed.  Dispense: 30 tablet; Refill: 0  8. Urinary frequency Labs pending - PSA, total and free   Labs pending Health Maintenance reviewed Diet and exercise encouraged  Follow up plan: 3 months   [name] Done, FNP

## 2020-06-19 NOTE — Patient Instructions (Signed)
Clostridioides Difficile Infection Clostridioides difficile, or C. diff, infection is caused by germs (bacteria). It causes irritation and swelling of the colon (colitis). This infection can spread from person to person (is contagious). You may also get C. diff from food or water, or from touching surfaces that have the germs on them. What are the causes? Certain germs live in the colon and help to digest food. This infection starts when the balance of helpful germs in the colon changes, and the C. diff germs grow out of control. This is caused by taking antibiotics. What increases the risk? Your risk is higher if you:  Take certain antibiotics that kill many types of germs.  Take antibiotics for a long time.  Stay for a long time in a health care setting, such as: ? A hospital. ? A long-term care facility.  Are older than age 65. Your risk is somewhat higher if you:  Have had C. diff infection before or had contact with C. diff germs.  Have a weak body defense system (immune system).  Take a medicine for a long time that reduces stomach acid. This includes proton pump inhibitors.  Have serious health problems, such as: ? Colon cancer. ? Inflammatory bowel disease (IBD).  Have had a procedure or surgery on your gastrointestinal (GI) tract. Some people develop C. diff even though they are not clearly at risk. What are the signs or symptoms?  Watery poop (diarrhea).  Fever.  Tiredness (fatigue).  Loss of appetite.  Nausea.  Swelling, pain, cramping, or tenderness in your belly (abdomen). How is this treated?  Stopping the antibiotics that you were taking when the C. diff infection began. Do this only as told by your doctor.  Taking certain antibiotics to stop C. diff from growing.  Taking donor poop (stool) from a healthy person and placing it into the colon. This may be done if the infection keeps coming back.  Having surgery to remove the infected part of the  colon. This is rare. Follow these instructions at home: Medicines  Take over-the-counter and prescription medicines only as told by your doctor.  Take your antibiotic medicine as told by your doctor. Do not stop taking the antibiotic even if you start to feel better.  Do not treat watery poop with medicines unless your doctor tells you to. Eating and drinking   Follow instructions from your doctor about eating or drinking.  Eat bland foods in small amounts as you are able. These foods include: ? Bananas. ? Applesauce. ? Rice. ? Low-fat (lean) meats. ? Toast. ? Crackers.  Follow your doctor's instructions on how to get enough fluids into your body. You may need to: ? Drink clear fluids. This includes water, fruit juice you have added water to, or low-calorie sports drinks. ? Suck on ice chips. ? Take an ORS (oral rehydration solution).  Avoid milk, caffeine, and alcohol.  Drink enough fluid to keep your pee (urine) pale yellow. Activity  Rest as told by your doctor.  Return to your normal activities as told by your doctor. Ask your doctor what activities are safe for you. General instructions  Wash your hands often with soap and water. Do this for at least 20 seconds. Bathe using soap and water daily.  Be sure your home is clean before you leave the hospital or clinic to go home.  Continue daily cleaning for at least a week after going home.  Keep all follow-up visits as told by your doctor. This is important.   How is this prevented? Hand hygiene   Wash your hands well before you cook and after you use the bathroom. Use soap and water for at least 20 seconds. Make sure that people who live with you also wash their hands often.  If you are being treated at a hospital or clinic, make sure that: ? All doctors and nurses wash their hands with soap and water before touching you. ? All visitors wash their hands with soap and water before touching you. Contact  precautions  If you get watery poop while you are in the hospital or a long-term care facility, let your doctor know right away.  When you visit someone in the hospital or a long-term care facility, follow the rules for wearing a gown, gloves, or other protective equipment.  If possible, avoid contact with people who have watery poop.  If you are sick and live with other people, use a separate bathroom, if you can. Clean environment  Clean surfaces that are touched often every day. Use a product that has chlorine bleach in it. The bleach should be 10% solution. Be sure to: ? Read the instructions to find out if the product you are using will work on what you are cleaning. ? Clean toilets, bathtubs, sinks, door knobs, and work surfaces.  If you are in the hospital, make sure that the staff cleans the surfaces in your room each day. Tell someone right away if body fluids have splashed or spilled. Washing clothes and linens  Use laundry soap that has chlorine bleach in it to wash clothes and linens. Be sure to: ? Use powder soap instead of liquid. ? Run your washing machine on the hot setting with nothing but soap in it. Do this once a month. Contact a doctor if:  Your symptoms do not get better or they get worse.  Your symptoms go away and then come back.  You have a fever.  You have new symptoms. Get help right away if:  You have more pain or tenderness in your belly.  Your poop is mostly bloody.  Your poop looks dark black and tarry.  You cannot eat or drink without vomiting.  You have signs of not having enough fluids in your body. These include: ? Dark pee, very little pee, or no pee. ? Cracked lips or dry mouth. ? No tears when you cry. ? Sunken eyes. ? Feeling sleepy. ? Feeling weak or dizzy. Summary  C. diff infection may happen after taking antibiotic medicines.  Symptoms include watery poop, fever, tiredness, loss of appetite, nausea, and belly  problems.  Treatment starts with stopping the antibiotics you were using when the infection began. Certain antibiotics are then used to stop C. diff from growing.  This infection is sometimes treated by placing donor poop into the colon or doing surgery.  Washing hands and cleaning every day can help keep C. diff from spreading. This information is not intended to replace advice given to you by your health care provider. Make sure you discuss any questions you have with your health care provider. Document Revised: 12/29/2018 Document Reviewed: 12/29/2018 Elsevier Patient Education  2020 [employer]  

## 2020-06-20 LAB — CBC WITH DIFFERENTIAL/PLATELET
Basophils Absolute: 0.1 10*3/uL (ref 0.0–0.2)
Basos: 1 %
EOS (ABSOLUTE): 0.3 10*3/uL (ref 0.0–0.4)
Eos: 5 %
Hematocrit: 46.9 % (ref 37.5–51.0)
Hemoglobin: 16.5 g/dL (ref 13.0–17.7)
Immature Grans (Abs): 0 10*3/uL (ref 0.0–0.1)
Immature Granulocytes: 1 %
Lymphocytes Absolute: 1.8 10*3/uL (ref 0.7–3.1)
Lymphs: 30 %
MCH: 33.5 pg — ABNORMAL HIGH (ref 26.6–33.0)
MCHC: 35.2 g/dL (ref 31.5–35.7)
MCV: 95 fL (ref 79–97)
Monocytes Absolute: 0.6 10*3/uL (ref 0.1–0.9)
Monocytes: 10 %
Neutrophils Absolute: 3.2 10*3/uL (ref 1.4–7.0)
Neutrophils: 53 %
Platelets: 229 10*3/uL (ref 150–450)
RBC: 4.92 x10E6/uL (ref 4.14–5.80)
RDW: 11.8 % (ref 11.6–15.4)
WBC: 6 10*3/uL (ref 3.4–10.8)

## 2020-06-20 LAB — PSA, TOTAL AND FREE
PSA, Free Pct: 24.3 %
PSA, Free: 0.17 ng/mL
Prostate Specific Ag, Serum: 0.7 ng/mL (ref 0.0–4.0)

## 2020-06-20 LAB — CMP14+EGFR
ALT: 74 IU/L — ABNORMAL HIGH (ref 0–44)
AST: 43 IU/L — ABNORMAL HIGH (ref 0–40)
Albumin/Globulin Ratio: 1.9 (ref 1.2–2.2)
Albumin: 4.8 g/dL (ref 3.8–4.9)
Alkaline Phosphatase: 86 IU/L (ref 44–121)
BUN/Creatinine Ratio: 20 (ref 10–24)
BUN: 17 mg/dL (ref 8–27)
Bilirubin Total: 0.3 mg/dL (ref 0.0–1.2)
CO2: 23 mmol/L (ref 20–29)
Calcium: 9.8 mg/dL (ref 8.6–10.2)
Chloride: 99 mmol/L (ref 96–106)
Creatinine, Ser: 0.86 mg/dL (ref 0.76–1.27)
GFR calc Af Amer: 109 mL/min/{1.73_m2} (ref >59)
GFR calc non Af Amer: 94 mL/min/{1.73_m2} (ref >59)
Globulin, Total: 2.5 g/dL (ref 1.5–4.5)
Glucose: 181 mg/dL — ABNORMAL HIGH (ref 65–99)
Potassium: 4.6 mmol/L (ref 3.5–5.2)
Sodium: 139 mmol/L (ref 134–144)
Total Protein: 7.3 g/dL (ref 6.0–8.5)

## 2020-06-20 LAB — LIPID PANEL
Chol/HDL Ratio: 7.5 ratio — ABNORMAL HIGH (ref 0.0–5.0)
Cholesterol, Total: 277 mg/dL — ABNORMAL HIGH (ref 100–199)
HDL: 37 mg/dL — ABNORMAL LOW (ref >39)
LDL Chol Calc (NIH): 106 mg/dL — ABNORMAL HIGH (ref 0–99)
Triglycerides: 766 mg/dL (ref 0–149)
VLDL Cholesterol Cal: 134 mg/dL — ABNORMAL HIGH (ref 5–40)

## 2020-09-21 ENCOUNTER — Ambulatory Visit: Payer: Self-pay | Admitting: Nurse Practitioner

## 2020-09-26 IMAGING — DX DG ABDOMEN 1V
2 series · 2 of 2 positions shown · non-contrast
Comparison: CT abdomen pelvis 05/18/2018

CLINICAL DATA: Bloating, N/V, belching and constipation x 1 year.

EXAM:
ABDOMEN - 1 VIEW

[abdomen kub (1 of 2)]
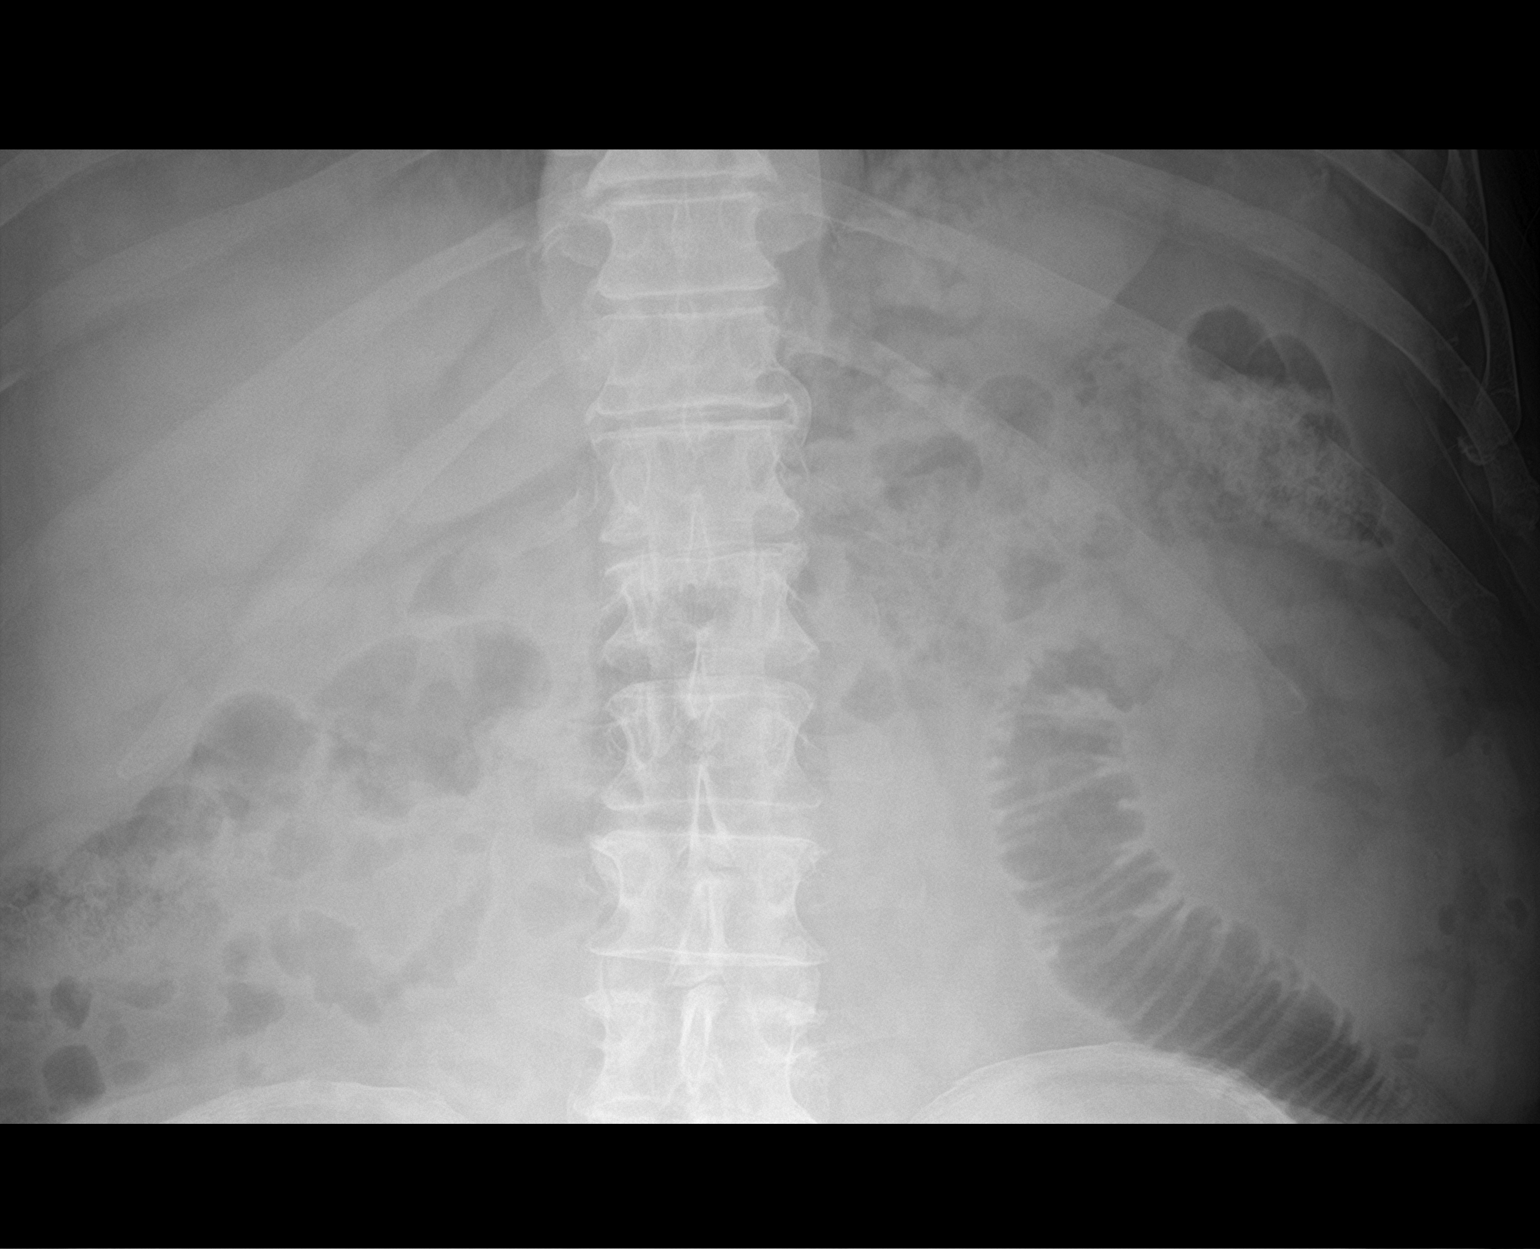

[abdomen kub (2 of 2)]
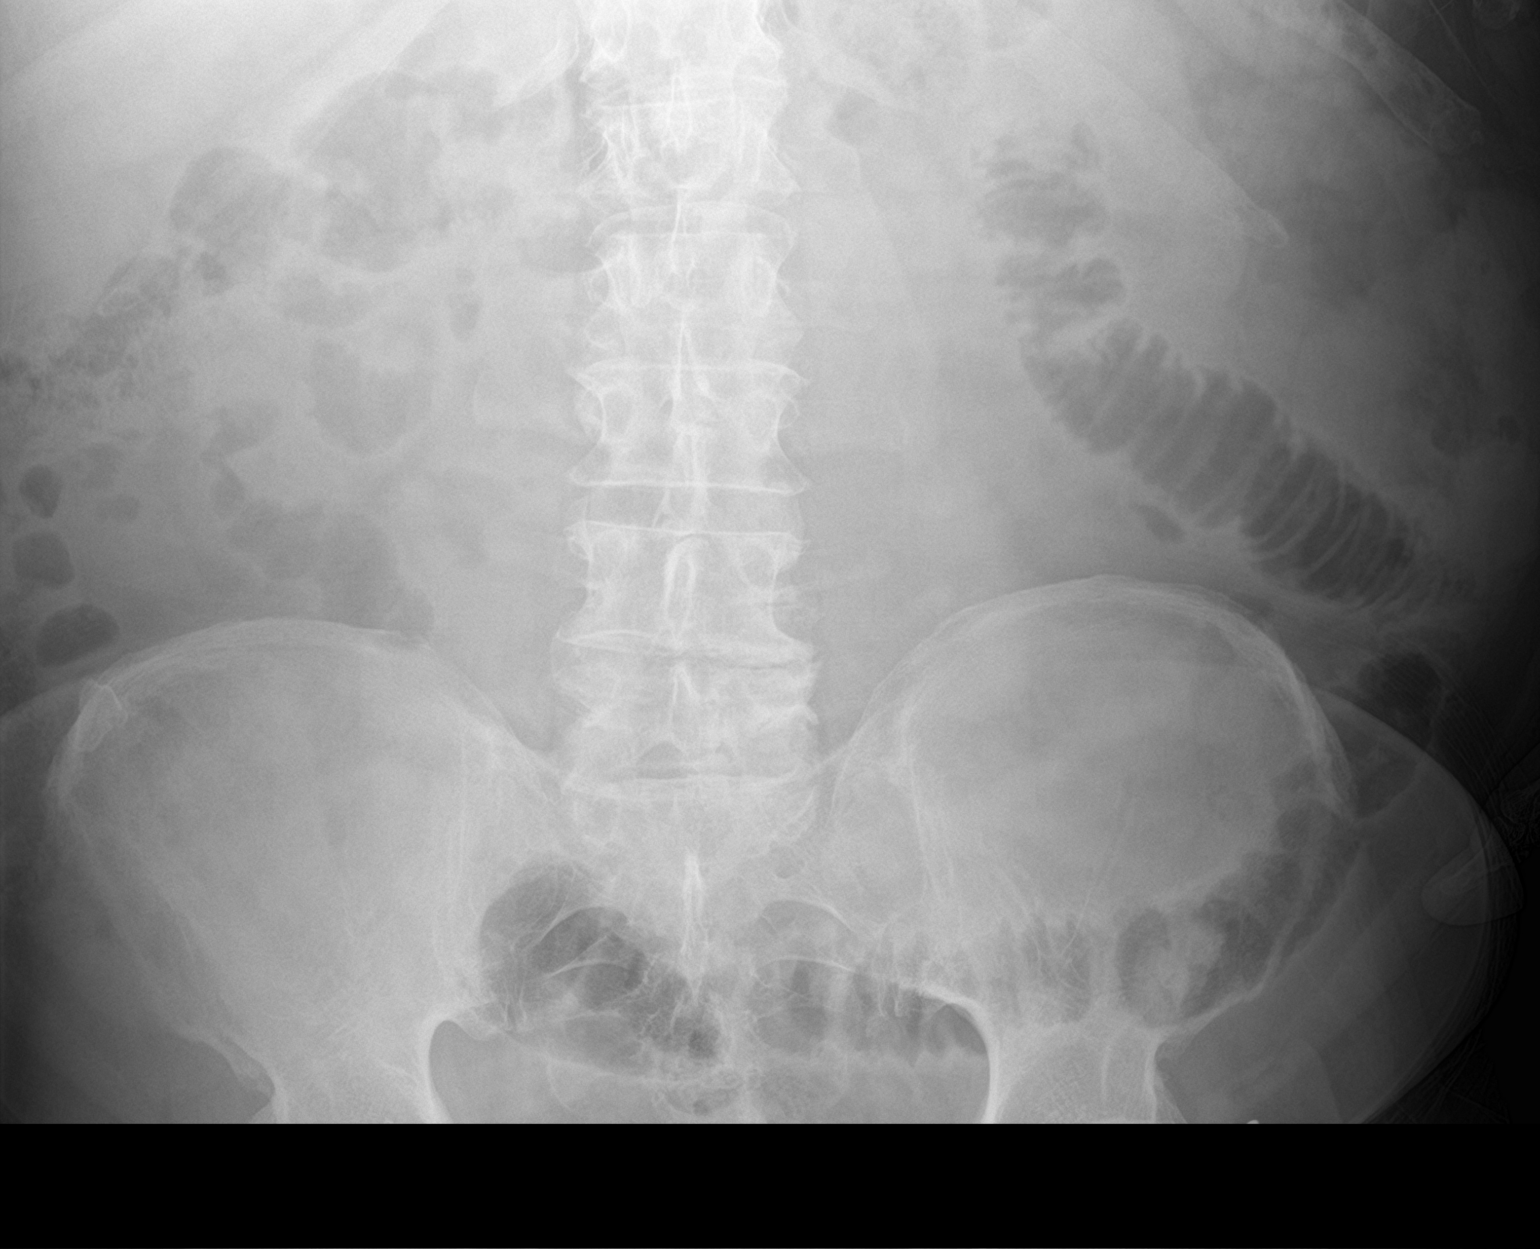

[2 of 2 positions shown; findings below may reference images not displayed]

FINDINGS: There are nondilated loops of bowel throughout the abdomen in a
nonobstructive pattern. No supine evidence for free air.
Degenerative changes are seen in the lumbar spine. No acute osseous
abnormality.
IMPRESSION: Nonobstructive bowel gas pattern.

## 2020-09-27 ENCOUNTER — Encounter: Payer: Self-pay | Admitting: Nurse Practitioner

## 2020-09-27 ENCOUNTER — Other Ambulatory Visit: Payer: Self-pay | Admitting: Nurse Practitioner

## 2020-09-27 DIAGNOSIS — I10 Essential (primary) hypertension: Secondary | ICD-10-CM

## 2020-10-04 ENCOUNTER — Telehealth: Payer: Self-pay

## 2020-10-04 NOTE — Telephone Encounter (Signed)
Patient called says he called about his appt on 2/4 and wanted to know why he needed it. Staff told patient it was for chronic follow up. He said he didn't know why he needed it. He gets his medicine refilled as needed.  Patient was waiting for a call from [place] to let him know if he needed to keep this appt.  Patient did not get a call, so he did not come in. Was sent No Show letter and was upset with [place]. I told patient I would take care of the no show since he did not get seen.  Patient still got his meds refilled without appointment although note says he must be seen to get medication refills.

## 2020-10-23 ENCOUNTER — Ambulatory Visit (INDEPENDENT_AMBULATORY_CARE_PROVIDER_SITE_OTHER): Payer: Medicare Other | Admitting: Nurse Practitioner

## 2020-10-23 ENCOUNTER — Other Ambulatory Visit: Payer: Self-pay

## 2020-10-23 ENCOUNTER — Encounter: Payer: Self-pay | Admitting: Nurse Practitioner

## 2020-10-23 VITALS — BP 129/82 | HR 82 | Temp 98.0°F | Resp 20 | Ht 69.0 in | Wt 243.0 lb

## 2020-10-23 DIAGNOSIS — R42 Dizziness and giddiness: Secondary | ICD-10-CM | POA: Diagnosis not present

## 2020-10-23 DIAGNOSIS — R0600 Dyspnea, unspecified: Secondary | ICD-10-CM | POA: Diagnosis not present

## 2020-10-23 DIAGNOSIS — Z9622 Myringotomy tube(s) status: Secondary | ICD-10-CM

## 2020-10-23 DIAGNOSIS — H6693 Otitis media, unspecified, bilateral: Secondary | ICD-10-CM | POA: Diagnosis not present

## 2020-10-23 DIAGNOSIS — R0609 Other forms of dyspnea: Secondary | ICD-10-CM

## 2020-10-23 MED ORDER — MECLIZINE HCL 25 MG PO TABS: 25.0000 mg | ORAL_TABLET | Freq: Three times a day (TID) | ORAL | 0 refills | Status: DC | PRN

## 2020-10-23 MED ORDER — METHYLPREDNISOLONE ACETATE 80 MG/ML IJ SUSP
80.0000 mg | Freq: Once | INTRAMUSCULAR | Status: AC
  Administered 2020-10-23: 80 mg via INTRAMUSCULAR

## 2020-10-23 MED ORDER — CIPROFLOXACIN-DEXAMETHASONE 0.3-0.1 % OT SUSP: 4.0000 [drp] | Freq: Two times a day (BID) | OTIC | 1 refills | Status: DC

## 2020-10-23 NOTE — Progress Notes (Signed)
   Subjective:    Patient ID: Donald Butler, male    DOB: 11/02/1960, 60 y.o.   MRN: [id number]   Chief Complaint: Dizziness (Concerned it may his ears/Nauseated at times) and Allergic Rhinitis    HPI Patient comes in today c/o dizziness. He has had ear problems his whole life and has tubes in his ears. He has been having dizziness when he is standing or laying down. He says it feels like he is on a cruise ship when he is walking. Has slight nausea. When he lays down it feels like the room is spinning. He does say he got a little tachycardiac when he was taking trash can to the road yesterday.   Review of Systems  Constitutional: Negative.   Respiratory: Negative.  Negative for shortness of breath.   Cardiovascular: Negative for chest pain.  Neurological: Positive for dizziness. Negative for headaches.  Psychiatric/Behavioral: Negative.   All other systems reviewed and are negative.      Objective:   Physical Exam Vitals and nursing note reviewed.  Constitutional:      Appearance: Normal appearance.  Cardiovascular:     Rate and Rhythm: Normal rate and regular rhythm.     Heart sounds: Normal heart sounds.  Pulmonary:     Effort: Pulmonary effort is normal.     Breath sounds: Normal breath sounds.  Skin:    General: Skin is warm.  Neurological:     General: No focal deficit present.     Mental Status: He is alert.  Psychiatric:        Mood and Affect: Mood normal.        Behavior: Behavior normal.    BP 129/82   Pulse 82   Temp 98 F (36.7 C) (Temporal)   Resp 20   Ht 5\' 9"  (1.753 m)   Wt 243 lb (110.2 kg)   SpO2 96%   BMI 35.88 kg/m        Assessment & Plan:  Donald Butler in today with chief complaint of Dizziness (Concerned it may his ears/Nauseated at times) and Allergic Rhinitis    1. Dizzy spells rest - EKG 12-Lead - methylPREDNISolone acetate (DEPO-MEDROL) injection 80 mg - meclizine (ANTIVERT) 25 MG tablet; Take 1 tablet (25 mg total) by mouth 3  (three) times daily as needed for dizziness.  Dispense: 30 tablet; Refill: 0  2. Dyspnea on exertion - Ambulatory referral to Cardiology  3. 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  Dispense: 7.5 mL; Refill: 1    The above assessment and management plan was discussed with the patient. The patient verbalized understanding of and has agreed to the management plan. Patient is aware to call the clinic if symptoms persist or worsen. Patient is aware when to return to the clinic for a follow-up visit. Patient educated on when it is appropriate to go to the emergency department.   [name] Done, FNP

## 2020-10-23 NOTE — Patient Instructions (Signed)
Vertigo Vertigo is the feeling that you or the things around you are moving when they are not. This feeling can come and go at any time. Vertigo often goes away on its own. This condition can be dangerous if it happens when you are doing activities like driving or working with machines. Your doctor will do tests to find the cause of your vertigo. These tests will also help your doctor decide on the best treatment for you. Follow these instructions at home: Eating and drinking  Drink enough fluid to keep your pee (urine) pale yellow.  Do not drink alcohol.      Activity  Return to your normal activities as told by your doctor. Ask your doctor what activities are safe for you.  In the morning, first sit up on the side of the bed. When you feel okay, stand slowly while you hold onto something until you know that your balance is fine.  Move slowly. Avoid sudden body or head movements or certain positions, as told by your doctor.  Use a cane if you have trouble standing or walking.  Sit down right away if you feel dizzy.  Avoid doing any tasks or activities that can cause danger to you or others if you get dizzy.  Avoid bending down if you feel dizzy. Place items in your home so that they are easy for you to reach without leaning over.  Do not drive or use heavy machinery if you feel dizzy. General instructions  Take over-the-counter and prescription medicines only as told by your doctor.  Keep all follow-up visits as told by your doctor. This is important. Contact a doctor if:  Your medicine does not help your vertigo.  You have a fever.  Your problems get worse or you have new symptoms.  Your family or friends see changes in your behavior.  The feeling of being sick to your stomach gets worse.  Your vomiting gets worse.  You lose feeling (have numbness) in part of your body.  You feel prickling and tingling in a part of your body. Get help right away if:  You have  trouble moving or talking.  You are always dizzy.  You pass out (faint).  You get very bad headaches.  You feel weak in your hands, arms, or legs.  You have changes in your hearing.  You have changes in how you see (vision).  You get a stiff neck.  Bright light starts to bother you. Summary  Vertigo is the feeling that you or the things around you are moving when they are not.  Your doctor will do tests to find the cause of your vertigo.  You may be told to avoid some tasks, positions, or movements.  Contact a doctor if your medicine is not helping, or if you have a fever, new symptoms, or a change in behavior.  Get help right away if you get very bad headaches, or if you have changes in how you speak, hear, or see. This information is not intended to replace advice given to you by your health care provider. Make sure you discuss any questions you have with your health care provider. Document Revised: 06/28/2018 Document Reviewed: 06/28/2018 Elsevier Patient Education  2021 [employer].

## 2020-11-01 ENCOUNTER — Encounter: Payer: Self-pay | Admitting: Cardiology

## 2020-11-01 ENCOUNTER — Ambulatory Visit: Payer: Medicare Other | Admitting: Cardiology

## 2020-11-01 DIAGNOSIS — I2510 Atherosclerotic heart disease of native coronary artery without angina pectoris: Secondary | ICD-10-CM | POA: Insufficient documentation

## 2020-11-01 NOTE — Progress Notes (Signed)
Cardiology Office Note   Date:  11/02/2020   ID:  Donald Butler, DOB [birth year], MRN [id number]  PCP:  Chevis Pretty, FNP  Cardiologist:   Minus Breeding, MD   Chief Complaint  Patient presents with  . Dizziness      History of Present Illness:. Donald Butler is a 60 y.o. male who presents for dizziness.    Coronary CT angiography showed a coronary artery calcium score of 856 Agatston units placing him in the 97th percentile for age and gender, suggesting a high risk for future cardiac events.  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 Echocardiogram on 06/17/2018 showed normal left ventricular systolic function and regional wall motion, LVEF 60 to 65%, and mild mitral regurgitation.   This was 2019.     He reports that he gets dizzy sometimes after getting up in the night to go to the bathroom.  The patient denies any new symptoms such as chest discomfort, neck or arm discomfort. There has been no new shortness of breath, PND or orthopnea. There have been no reported palpitations, presyncope or syncope.  He does have DOE.  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  He was told to come see [place] because of this and his past history.  This has been slowly progressive.  He does not have any resting complaints.   Past Medical History:  Diagnosis Date  . Allergy   . Elevated coronary artery calcium score   . GERD (gastroesophageal reflux disease)   . Gout   . Hyperlipidemia   . Hypertension   . Internal hemorrhoid   . Nerve damage    left leg  . 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 [place]   ruptured disk  x 2  . CARPAL TUNNEL RELEASE Left   . COLONOSCOPY    . EXTERNAL EAR SURGERY    . KNEE ARTHROSCOPY  2006, 2008   x 2  left  . MASTOIDECTOMY  2013   right  . OPEN ANTERIOR  SHOULDER RECONSTRUCTION  1970   right  . POLYPECTOMY    . reconstruction left leg  2009   multiple surgery after accident     Current Outpatient Medications  Medication Sig Dispense Refill  . albuterol (PROVENTIL HFA;VENTOLIN HFA) 108 (90 Base) MCG/ACT inhaler Inhale 2 puffs into the lungs every 6 (six) hours as needed for wheezing or shortness of breath. 1 Inhaler 2  . allopurinol (ZYLOPRIM) 300 MG tablet Take 0.5 tablets (150 mg total) by mouth daily. 90 tablet 1  . amLODipine (NORVASC) 10 MG tablet Take 1 tablet by mouth once daily 90 tablet 0  . aspirin EC 81 MG tablet Take 81 mg by mouth daily.     Marland Kitchen atorvastatin (LIPITOR) 20 MG tablet Take 1 tablet (20 mg total) by mouth daily. Must be seen for any further refills 90 tablet 0  . ciprofloxacin-dexamethasone (CIPRODEX) OTIC suspension Place 4 drops into both ears 2 (two) times daily. 7.5 mL 1  . fenofibrate (TRICOR) 145 MG tablet Take 1 tablet (145 mg total) by mouth daily. 30 tablet 5  . HYDROcodone-acetaminophen (NORCO) 10-325 MG tablet Take 1 tablet by mouth every 8 (eight) hours as needed. 30 tablet 0  . lisinopril (ZESTRIL) 40 MG tablet TAKE 1 TABLET BY MOUTH  ONCE DAILY . APPOINTMENT REQUIRED FOR FUTURE REFILLS 90 tablet 1  . loratadine (CLARITIN) 10 MG tablet Take 10 mg by mouth daily.     . meclizine (ANTIVERT) 25 MG tablet Take 1 tablet (25 mg total) by mouth 3 (three) times daily as needed for dizziness. 30 tablet 0  . Multiple Vitamin (MULTIVITAMIN) tablet Take 1 tablet by mouth daily.     Marland Kitchen omeprazole (PRILOSEC OTC) 20 MG tablet Take 1 tablet (20 mg total) by mouth 2 (two) times daily. TAKE ON AN EMPTY STOMACH. THEN WAIT ONE HOUR TO EAT 60 tablet 2   No current facility-administered medications for this visit.    Allergies:   Penicillins   ROS:  Please see the history of present illness.   Otherwise, review of systems are positive for none.   All other systems are reviewed and negative.    PHYSICAL EXAM: VS:  BP (!)  150/92   Pulse 77   Ht 5\' 9"  (1.753 m)   Wt 246 lb (111.6 kg)   SpO2 97%   BMI 36.33 kg/m  , BMI Body mass index is 36.33 kg/m. GENERAL:  Well appearing NECK:  No jugular venous distention, waveform within normal limits, carotid upstroke brisk and symmetric, no bruits, no thyromegaly LUNGS:  Clear to auscultation bilaterally CHEST:  Unremarkable HEART:  PMI not displaced or sustained,S1 and S2 within normal limits, no S3, no S4, no clicks, no rubs, no murmurs ABD:  Flat, positive bowel sounds normal in frequency in pitch, no bruits, no rebound, no guarding, no midline pulsatile mass, no hepatomegaly, no splenomegaly EXT:  2 plus pulses throughout, no edema, no cyanosis no clubbing    EKG:  EKG is not ordered today. The ekg ordered 10/23/2020 demonstrates sinus rhythm, rate 76, axis within normal limits, intervals within normal limits, no acute ST-T wave changes.   Recent Labs: 06/19/2020: ALT 74; BUN 17; Creatinine, Ser 0.86; Hemoglobin 16.5; Platelets 229; Potassium 4.6; Sodium 139    Lipid Panel    Component Value Date/Time   CHOL 277 (H) 06/19/2020 1107   TRIG 766 (HH) 06/19/2020 1107   HDL 37 (L) 06/19/2020 1107   CHOLHDL 7.5 (H) 06/19/2020 1107   LDLCALC 106 (H) 06/19/2020 1107      Wt Readings from Last 3 Encounters:  11/02/20 246 lb (111.6 kg)  10/23/20 243 lb (110.2 kg)  06/19/20 251 lb (113.9 kg)      Other studies Reviewed: Additional studies/ records that were reviewed today include: Labs. Review of the above records demonstrates:  Please see elsewhere in the note.     ASSESSMENT AND PLAN:  ELEVATED CORONARY CALCIUM :   He does have some dyspnea on exertion but this is likely related to his obesity and sedentary lifestyle. I will bring the patient back for a POET (Plain Old Exercise Test). This will allow me to screen for obstructive coronary disease, risk stratify and very importantly provide a prescription for exercise.  Hypertension:   He is going to  give me a blood pressure diary.  He says his blood pressures are controlled at home and it was not elevated the other day.  He might need med titration.  Mixed dyslipidemia:   LDL was 106.  Trig very elevated at 766 in Nov.  Discussed diet.  He was not taking his fenofibrate and perhaps his other medicines as he had some colitis.  He is now over this and it is encouraged that he take his medications.  Dizziness: I suspect he is having some problems with his sinuses as he has this history.  It might be also allergies or inner ear.  I suggested fluticasone and saline nasal spray.   Current medicines are reviewed at length with the patient today.  The patient does not have concerns regarding medicines.  The following changes have been made: As above  Labs/ tests ordered today include:   Orders Placed This Encounter  Procedures  . EXERCISE TOLERANCE TEST (ETT)     Disposition:   FU with me in one year in [place].     Signed, Minus Breeding, MD  11/02/2020 9:23 AM    [facility]

## 2020-11-02 ENCOUNTER — Ambulatory Visit (INDEPENDENT_AMBULATORY_CARE_PROVIDER_SITE_OTHER): Payer: Medicare Other | Admitting: Cardiology

## 2020-11-02 ENCOUNTER — Encounter: Payer: Self-pay | Admitting: *Deleted

## 2020-11-02 ENCOUNTER — Telehealth: Payer: Self-pay | Admitting: Cardiology

## 2020-11-02 ENCOUNTER — Encounter: Payer: Self-pay | Admitting: Cardiology

## 2020-11-02 VITALS — BP 150/92 | HR 77 | Ht 69.0 in | Wt 246.0 lb

## 2020-11-02 DIAGNOSIS — I2510 Atherosclerotic heart disease of native coronary artery without angina pectoris: Secondary | ICD-10-CM | POA: Diagnosis not present

## 2020-11-02 DIAGNOSIS — I10 Essential (primary) hypertension: Secondary | ICD-10-CM | POA: Diagnosis not present

## 2020-11-02 DIAGNOSIS — E785 Hyperlipidemia, unspecified: Secondary | ICD-10-CM | POA: Diagnosis not present

## 2020-11-02 DIAGNOSIS — R079 Chest pain, unspecified: Secondary | ICD-10-CM

## 2020-11-02 NOTE — Patient Instructions (Addendum)
Medication Instructions:   Your physician recommends that you continue on your current medications as directed. Please refer to the Current Medication list given to you today.  Fluticasone 50 mcg 2 sprays in each nostril daily as needed  Saline nasal spray daily as needed  Labwork:  Covid test 2-3 days before stress test. Please quarantine afterwards until stress test is complete  Testing/Procedures: Your physician has requested that you have an exercise tolerance test. For further information please visit [email or web address]. Please also follow instruction sheet, as given.  Follow-Up:  Your physician recommends that you schedule a follow-up appointment in: 1 year in [place]. You will receive a reminder letter in the mail in about 10 months reminding you to call and schedule your appointment. If you don't receive this letter, please contact our office.  Any Other Special Instructions Will Be Listed Below (If Applicable).  If you need a refill on your cardiac medications before your next appointment, please call your pharmacy.

## 2020-11-02 NOTE — Telephone Encounter (Signed)
Pre-cert Verification for the following procedure    GXT  DATE:   11/14/2020  LOCATION: [facility]

## 2020-11-12 ENCOUNTER — Other Ambulatory Visit: Payer: Self-pay

## 2020-11-12 ENCOUNTER — Other Ambulatory Visit (HOSPITAL_COMMUNITY)
Admission: RE | Admit: 2020-11-12 | Discharge: 2020-11-12 | Disposition: A | Discharge status: Home / Self Care | Payer: Medicare Other | Source: Ambulatory Visit | Attending: Cardiology | Admitting: Cardiology

## 2020-11-12 DIAGNOSIS — Z01812 Encounter for preprocedural laboratory examination: Secondary | ICD-10-CM | POA: Insufficient documentation

## 2020-11-12 DIAGNOSIS — Z20822 Contact with and (suspected) exposure to covid-19: Secondary | ICD-10-CM | POA: Insufficient documentation

## 2020-11-13 LAB — SARS CORONAVIRUS 2 (TAT 6-24 HRS): SARS Coronavirus 2: NEGATIVE

## 2020-11-14 ENCOUNTER — Ambulatory Visit (HOSPITAL_COMMUNITY)
Admission: RE | Admit: 2020-11-14 | Discharge: 2020-11-14 | Disposition: A | Discharge status: Home / Self Care | Payer: Medicare Other | Source: Ambulatory Visit | Attending: Cardiology | Admitting: Cardiology

## 2020-11-14 ENCOUNTER — Other Ambulatory Visit: Payer: Self-pay

## 2020-11-14 DIAGNOSIS — R079 Chest pain, unspecified: Secondary | ICD-10-CM

## 2020-11-14 LAB — EXERCISE TOLERANCE TEST
Estimated workload: 7 METS
Exercise duration (min): 5 min
Exercise duration (sec): 10 s
MPHR: 160 {beats}/min
Peak HR: 153 {beats}/min
Percent HR: 95 %
RPE: 15
Rest HR: 81 {beats}/min

## 2020-11-19 ENCOUNTER — Telehealth: Payer: Self-pay | Admitting: Cardiology

## 2020-11-19 NOTE — Telephone Encounter (Signed)
New Message     Patient would like the results of his exercise test

## 2020-11-19 NOTE — Telephone Encounter (Signed)
Pt voiced understanding - routed to pcp   Negative POET (Plain Old Exercise Treadmill) . Call Mr. Homesley with the results and send results to Chevis Pretty, FNP. No further work up.

## 2020-11-27 ENCOUNTER — Telehealth: Payer: Self-pay | Admitting: *Deleted

## 2020-11-27 DIAGNOSIS — H6983 Other specified disorders of Eustachian tube, bilateral: Secondary | ICD-10-CM | POA: Diagnosis not present

## 2020-11-27 NOTE — Telephone Encounter (Signed)
-----   Message from Earvin Hansen, LPN sent at 6/81/1572 11:34 AM EDT ----- Patient seen in [place] office, will forward to Inis Sizer for follow up

## 2020-11-29 NOTE — Telephone Encounter (Signed)
Negative POET (Plain Old Exercise Treadmill) . Call Mr. Helin with the results and send results to Chevis Pretty, FNP. No further work up.   Released in mychart Copy sent to PCP

## 2020-12-20 DIAGNOSIS — H6983 Other specified disorders of Eustachian tube, bilateral: Secondary | ICD-10-CM | POA: Diagnosis not present

## 2020-12-20 DIAGNOSIS — Z9622 Myringotomy tube(s) status: Secondary | ICD-10-CM | POA: Diagnosis not present

## 2020-12-20 DIAGNOSIS — H906 Mixed conductive and sensorineural hearing loss, bilateral: Secondary | ICD-10-CM | POA: Diagnosis not present

## 2020-12-20 DIAGNOSIS — H6982 Other specified disorders of Eustachian tube, left ear: Secondary | ICD-10-CM | POA: Diagnosis not present

## 2020-12-20 DIAGNOSIS — H9211 Otorrhea, right ear: Secondary | ICD-10-CM | POA: Diagnosis not present

## 2020-12-21 ENCOUNTER — Other Ambulatory Visit: Payer: Self-pay | Admitting: Nurse Practitioner

## 2020-12-21 DIAGNOSIS — I10 Essential (primary) hypertension: Secondary | ICD-10-CM

## 2021-01-03 ENCOUNTER — Other Ambulatory Visit: Payer: Self-pay | Admitting: Nurse Practitioner

## 2021-01-03 DIAGNOSIS — I10 Essential (primary) hypertension: Secondary | ICD-10-CM

## 2021-01-03 NOTE — Telephone Encounter (Signed)
Last OV 06/2020, next appt not scheduled  30 day supply sent in  ntbs for further refills

## 2021-02-05 ENCOUNTER — Telehealth: Payer: Self-pay | Admitting: *Deleted

## 2021-02-05 DIAGNOSIS — I10 Essential (primary) hypertension: Secondary | ICD-10-CM

## 2021-02-05 MED ORDER — AMLODIPINE BESYLATE 10 MG PO TABS: 1.0000 | ORAL_TABLET | Freq: Every day | ORAL | 0 refills | Status: DC

## 2021-02-05 NOTE — Telephone Encounter (Signed)
Pt called into the billing office about his refill of his amlodipine which was denied. His last chronic management visit was in November. Pt was informed he needs to make an appointment and a 30 day supply will be sent to the pharmacy

## 2021-02-21 ENCOUNTER — Encounter: Payer: Self-pay | Admitting: Nurse Practitioner

## 2021-02-21 ENCOUNTER — Other Ambulatory Visit: Payer: Self-pay

## 2021-02-21 ENCOUNTER — Ambulatory Visit (INDEPENDENT_AMBULATORY_CARE_PROVIDER_SITE_OTHER): Payer: Medicare Other | Admitting: Nurse Practitioner

## 2021-02-21 VITALS — BP 134/75 | HR 87 | Temp 97.9°F | Resp 20 | Ht 69.0 in | Wt 242.0 lb

## 2021-02-21 DIAGNOSIS — K219 Gastro-esophageal reflux disease without esophagitis: Secondary | ICD-10-CM

## 2021-02-21 DIAGNOSIS — M109 Gout, unspecified: Secondary | ICD-10-CM

## 2021-02-21 DIAGNOSIS — I10 Essential (primary) hypertension: Secondary | ICD-10-CM | POA: Diagnosis not present

## 2021-02-21 DIAGNOSIS — I2510 Atherosclerotic heart disease of native coronary artery without angina pectoris: Secondary | ICD-10-CM

## 2021-02-21 DIAGNOSIS — K5790 Diverticulosis of intestine, part unspecified, without perforation or abscess without bleeding: Secondary | ICD-10-CM | POA: Diagnosis not present

## 2021-02-21 DIAGNOSIS — E782 Mixed hyperlipidemia: Secondary | ICD-10-CM | POA: Diagnosis not present

## 2021-02-21 MED ORDER — AMLODIPINE BESYLATE 10 MG PO TABS
10.0000 mg | ORAL_TABLET | Freq: Every day | ORAL | 0 refills | Status: DC
Start: 2021-02-21 — End: 2021-04-16

## 2021-02-21 MED ORDER — FENOFIBRATE 145 MG PO TABS: 145.0000 mg | ORAL_TABLET | Freq: Every day | ORAL | 5 refills | Status: DC

## 2021-02-21 MED ORDER — LISINOPRIL 40 MG PO TABS: ORAL_TABLET | ORAL | 0 refills | Status: DC

## 2021-02-21 MED ORDER — ALLOPURINOL 300 MG PO TABS
150.0000 mg | ORAL_TABLET | Freq: Every day | ORAL | 1 refills | Status: DC
Start: 2021-02-21 — End: 2022-02-20

## 2021-02-21 MED ORDER — OMEPRAZOLE MAGNESIUM 20 MG PO TBEC: 20.0000 mg | DELAYED_RELEASE_TABLET | Freq: Two times a day (BID) | ORAL | 2 refills | Status: DC

## 2021-02-21 MED ORDER — ATORVASTATIN CALCIUM 20 MG PO TABS: 20.0000 mg | ORAL_TABLET | Freq: Every day | ORAL | 0 refills | Status: DC

## 2021-02-21 NOTE — Patient Instructions (Signed)
https://www.nhlbi.nih.gov/files/docs/public/heart/dash_brief.pdf">  DASH Eating Plan DASH stands for Dietary Approaches to Stop Hypertension. The DASH eating plan is a healthy eating plan that has been shown to: Reduce high blood pressure (hypertension). Reduce your risk for type 2 diabetes, heart disease, and stroke. Help with weight loss. What are tips for following this plan? Reading food labels Check food labels for the amount of salt (sodium) per serving. Choose foods with less than 5 percent of the Daily Value of sodium. Generally, foods with less than 300 milligrams (mg) of sodium per serving fit into this eating plan. To find whole grains, look for the word "whole" as the first word in the ingredient list. Shopping Buy products labeled as "low-sodium" or "no salt added." Buy fresh foods. Avoid canned foods and pre-made or frozen meals. Cooking Avoid adding salt when cooking. Use salt-free seasonings or herbs instead of table salt or sea salt. Check with your health care provider or pharmacist before using salt substitutes. Do not fry foods. Cook foods using healthy methods such as baking, boiling, grilling, roasting, and broiling instead. Cook with heart-healthy oils, such as olive, canola, avocado, soybean, or sunflower oil. Meal planning  Eat a balanced diet that includes: 4 or more servings of fruits and 4 or more servings of vegetables each day. Try to fill one-half of your plate with fruits and vegetables. 6-8 servings of whole grains each day. Less than 6 oz (170 g) of lean meat, poultry, or fish each day. A 3-oz (85-g) serving of meat is about the same size as a deck of cards. One egg equals 1 oz (28 g). 2-3 servings of low-fat dairy each day. One serving is 1 cup (237 mL). 1 serving of nuts, seeds, or beans 5 times each week. 2-3 servings of heart-healthy fats. Healthy fats called omega-3 fatty acids are found in foods such as walnuts, flaxseeds, fortified milks, and eggs.  These fats are also found in cold-water fish, such as sardines, salmon, and mackerel. Limit how much you eat of: Canned or prepackaged foods. Food that is high in trans fat, such as some fried foods. Food that is high in saturated fat, such as fatty meat. Desserts and other sweets, sugary drinks, and other foods with added sugar. Full-fat dairy products. Do not salt foods before eating. Do not eat more than 4 egg yolks a week. Try to eat at least 2 vegetarian meals a week. Eat more home-cooked food and less restaurant, buffet, and fast food.  Lifestyle When eating at a restaurant, ask that your food be prepared with less salt or no salt, if possible. If you drink alcohol: Limit how much you use to: 0-1 drink a day for women who are not pregnant. 0-2 drinks a day for men. Be aware of how much alcohol is in your drink. In the [place] one drink equals one 12 oz bottle of beer (355 mL), one 5 oz glass of wine (148 mL), or one 1 oz glass of hard liquor (44 mL). General information Avoid eating more than 2,300 mg of salt a day. If you have hypertension, you may need to reduce your sodium intake to 1,500 mg a day. Work with your health care provider to maintain a healthy body weight or to lose weight. Ask what an ideal weight is for you. Get at least 30 minutes of exercise that causes your heart to beat faster (aerobic exercise) most days of the week. Activities may include walking, swimming, or biking. Work with your health care provider   or dietitian to adjust your eating plan to your individual calorie needs. What foods should I eat? Fruits All fresh, dried, or frozen fruit. Canned fruit in natural juice (without addedsugar). Vegetables Fresh or frozen vegetables (raw, steamed, roasted, or grilled). Low-sodium or reduced-sodium tomato and vegetable juice. Low-sodium or reduced-sodium tomatosauce and tomato paste. Low-sodium or reduced-sodium canned vegetables. Grains Whole-grain or  whole-wheat bread. Whole-grain or whole-wheat pasta. Brown rice. Oatmeal. Quinoa. Bulgur. Whole-grain and low-sodium cereals. Pita bread.Low-fat, low-sodium crackers. Whole-wheat flour tortillas. Meats and other proteins Skinless chicken or turkey. Ground chicken or turkey. Pork with fat trimmed off. Fish and seafood. Egg whites. Dried beans, peas, or lentils. Unsalted nuts, nut butters, and seeds. Unsalted canned beans. Lean cuts of beef with fat trimmed off. Low-sodium, lean precooked or cured meat, such as sausages or meatloaves. Dairy Low-fat (1%) or fat-free (skim) milk. Reduced-fat, low-fat, or fat-free cheeses. Nonfat, low-sodium ricotta or cottage cheese. Low-fat or nonfatyogurt. Low-fat, low-sodium cheese. Fats and oils Soft margarine without trans fats. Vegetable oil. Reduced-fat, low-fat, or light mayonnaise and salad dressings (reduced-sodium). Canola, safflower, olive, avocado, soybean, andsunflower oils. Avocado. Seasonings and condiments Herbs. Spices. Seasoning mixes without salt. Other foods Unsalted popcorn and pretzels. Fat-free sweets. The items listed above may not be a complete list of foods and beverages you can eat. Contact a dietitian for more information. What foods should I avoid? Fruits Canned fruit in a light or heavy syrup. Fried fruit. Fruit in cream or buttersauce. Vegetables Creamed or fried vegetables. Vegetables in a cheese sauce. Regular canned vegetables (not low-sodium or reduced-sodium). Regular canned tomato sauce and paste (not low-sodium or reduced-sodium). Regular tomato and vegetable juice(not low-sodium or reduced-sodium). Pickles. Olives. Grains Baked goods made with fat, such as croissants, muffins, or some breads. Drypasta or rice meal packs. Meats and other proteins Fatty cuts of meat. Ribs. Fried meat. Bacon. Bologna, salami, and other precooked or cured meats, such as sausages or meat loaves. Fat from the back of a pig (fatback). Bratwurst.  Salted nuts and seeds. Canned beans with added salt. Canned orsmoked fish. Whole eggs or egg yolks. Chicken or turkey with skin. Dairy Whole or 2% milk, cream, and half-and-half. Whole or full-fat cream cheese. Whole-fat or sweetened yogurt. Full-fat cheese. Nondairy creamers. Whippedtoppings. Processed cheese and cheese spreads. Fats and oils Butter. Stick margarine. Lard. Shortening. Ghee. Bacon fat. Tropical oils, suchas coconut, palm kernel, or palm oil. Seasonings and condiments Onion salt, garlic salt, seasoned salt, table salt, and sea salt. Worcestershire sauce. Tartar sauce. Barbecue sauce. Teriyaki sauce. Soy sauce, including reduced-sodium. Steak sauce. Canned and packaged gravies. Fish sauce. Oyster sauce. Cocktail sauce. Store-bought horseradish. Ketchup. Mustard. Meat flavorings and tenderizers. Bouillon cubes. Hot sauces. Pre-made or packaged marinades. Pre-made or packaged taco seasonings. Relishes. Regular saladdressings. Other foods Salted popcorn and pretzels. The items listed above may not be a complete list of foods and beverages you should avoid. Contact a dietitian for more information. Where to find more information National Heart, Lung, and Blood Institute: www.nhlbi.nih.gov American Heart Association: www.heart.org Academy of Nutrition and Dietetics: www.eatright.org National Kidney Foundation: www.kidney.org Summary The DASH eating plan is a healthy eating plan that has been shown to reduce high blood pressure (hypertension). It may also reduce your risk for type 2 diabetes, heart disease, and stroke. When on the DASH eating plan, aim to eat more fresh fruits and vegetables, whole grains, lean proteins, low-fat dairy, and heart-healthy fats. With the DASH eating plan, you should limit salt (sodium) intake to 2,300   mg a day. If you have hypertension, you may need to reduce your sodium intake to 1,500 mg a day. Work with your health care provider or dietitian to adjust  your eating plan to your individual calorie needs. This information is not intended to replace advice given to you by your health care provider. Make sure you discuss any questions you have with your healthcare provider. Document Revised: 07/08/2019 Document Reviewed: 07/08/2019 Elsevier Patient Education  2022 [employer]  

## 2021-02-21 NOTE — Progress Notes (Signed)
Subjective:    Patient ID: Donald Butler, male    DOB: October 09, 1960, 60 y.o.   MRN: [id number]   Chief Complaint: Medical Management of Chronic Issues    HPI:  1. Primary hypertension No c/o chest pian, sob or headache. He does not check his blood pressure at home. BP Readings from Last 3 Encounters:  11/02/20 (!) 150/92  10/23/20 129/82  06/19/20 (!) 154/97     2. Mixed hyperlipidemia Tries to watch diet. Does no dedicated exercise. Lab Results  Component Value Date   CHOL 277 (H) 06/19/2020   HDL 37 (L) 06/19/2020   LDLCALC 106 (H) 06/19/2020   TRIG 766 (HH) 06/19/2020   CHOLHDL 7.5 (H) 06/19/2020     3. Coronary artery disease involving native coronary artery of native heart without angina pectoris Last saw cardiology 11/02/20. According to office note- no changes were made to plan of care. He had EST on 11/14/20 which was normal.  4. Diverticulosis No recent flare ups.  5. Gastroesophageal reflux disease, unspecified whether esophagitis present Is on omperazole daily which works well to keep symptoms under control.  6. Gout, unspecified cause, unspecified chronicity, unspecified site No recent flare up  7. Morbid obesity ([place]) Weight is down 4lbs Wt Readings from Last 3 Encounters:  02/21/21 242 lb (109.8 kg)  11/02/20 246 lb (111.6 kg)  10/23/20 243 lb (110.2 kg)   BMI Readings from Last 3 Encounters:  02/21/21 35.74 kg/m  11/02/20 36.33 kg/m  10/23/20 35.88 kg/m       Outpatient Encounter Medications as of 02/21/2021  Medication Sig   albuterol (PROVENTIL HFA;VENTOLIN HFA) 108 (90 Base) MCG/ACT inhaler Inhale 2 puffs into the lungs every 6 (six) hours as needed for wheezing or shortness of breath.   allopurinol (ZYLOPRIM) 300 MG tablet Take 0.5 tablets (150 mg total) by mouth daily.   amLODipine (NORVASC) 10 MG tablet Take 1 tablet (10 mg total) by mouth daily.   aspirin EC 81 MG tablet Take 81 mg by mouth daily.    atorvastatin (LIPITOR) 20 MG  tablet Take 1 tablet (20 mg total) by mouth daily. Must be seen for any further refills   ciprofloxacin-dexamethasone (CIPRODEX) OTIC suspension Place 4 drops into both ears 2 (two) times daily.   fenofibrate (TRICOR) 145 MG tablet Take 1 tablet (145 mg total) by mouth daily.   HYDROcodone-acetaminophen (NORCO) 10-325 MG tablet Take 1 tablet by mouth every 8 (eight) hours as needed.   lisinopril (ZESTRIL) 40 MG tablet TAKE 1 TABLET BY MOUTH ONCE DAILY   loratadine (CLARITIN) 10 MG tablet Take 10 mg by mouth daily.    meclizine (ANTIVERT) 25 MG tablet Take 1 tablet (25 mg total) by mouth 3 (three) times daily as needed for dizziness.   Multiple Vitamin (MULTIVITAMIN) tablet Take 1 tablet by mouth daily.    omeprazole (PRILOSEC OTC) 20 MG tablet Take 1 tablet (20 mg total) by mouth 2 (two) times daily. TAKE ON AN EMPTY STOMACH. THEN WAIT ONE HOUR TO EAT   No facility-administered encounter medications on file as of 02/21/2021.    Past Surgical History:  Procedure Laterality Date   BACK SURGERY  1982, 1983   ruptured disk  x 2   CARPAL TUNNEL RELEASE Left    COLONOSCOPY     EXTERNAL EAR SURGERY     KNEE ARTHROSCOPY  2006, 2008   x 2  left   MASTOIDECTOMY  2013   right   OPEN ANTERIOR SHOULDER RECONSTRUCTION  1970   right   POLYPECTOMY     reconstruction left leg  2009   multiple surgery after accident    Family History  Problem Relation Age of Onset   Diabetes Father    Colon cancer Neg Hx    Stomach cancer Neg Hx    Esophageal cancer Neg Hx    Rectal cancer Neg Hx    Pancreatic cancer Neg Hx     New complaints: None today  Social history: Lives with wife  Controlled substance contract: n/a     Review of Systems  Constitutional:  Negative for diaphoresis.  Eyes:  Negative for pain.  Respiratory:  Negative for shortness of breath.   Cardiovascular:  Negative for chest pain, palpitations and leg swelling.  Gastrointestinal:  Negative for abdominal pain.  Endocrine:  Negative for polydipsia.  Skin:  Negative for rash.  Neurological:  Negative for dizziness, weakness and headaches.  Hematological:  Does not bruise/bleed easily.  All other systems reviewed and are negative.     Objective:   Physical Exam Vitals and nursing note reviewed.  Constitutional:      Appearance: Normal appearance. He is well-developed.  HENT:     Head: Normocephalic.     Nose: Nose normal.  Eyes:     Pupils: Pupils are equal, round, and reactive to light.  Neck:     Thyroid: No thyroid mass or thyromegaly.     Vascular: No carotid bruit or JVD.     Trachea: Phonation normal.  Cardiovascular:     Rate and Rhythm: Normal rate and regular rhythm.  Pulmonary:     Effort: Pulmonary effort is normal. No respiratory distress.     Breath sounds: Normal breath sounds.  Abdominal:     General: Bowel sounds are normal.     Palpations: Abdomen is soft.     Tenderness: There is no abdominal tenderness.  Musculoskeletal:        General: Normal range of motion.     Cervical back: Normal range of motion and neck supple.     Right lower leg: Edema (1+) present.     Left lower leg: Edema (1+) present.  Lymphadenopathy:     Cervical: No cervical adenopathy.  Skin:    General: Skin is warm and dry.  Neurological:     Mental Status: He is alert and oriented to person, place, and time.  Psychiatric:        Behavior: Behavior normal.        Thought Content: Thought content normal.        Judgment: Judgment normal.    BP 134/75   Pulse 87   Temp 97.9 F (36.6 C) (Temporal)   Resp 20   Ht 5' 9"  (1.753 m)   Wt 242 lb (109.8 kg)   SpO2 96%   BMI 35.74 kg/m        Assessment & Plan:  FLYNT BREEZE comes in today with chief complaint of Medical Management of Chronic Issues   Diagnosis and orders addressed:  1. Primary hypertension Low sodium diet - amLODipine (NORVASC) 10 MG tablet; Take 1 tablet (10 mg total) by mouth daily.  Dispense: 30 tablet; Refill: 0 -  lisinopril (ZESTRIL) 40 MG tablet; TAKE 1 TABLET BY MOUTH ONCE DAILY  Dispense: 90 tablet; Refill: 0 - CBC with Differential/Platelet - CMP14+EGFR  2. Mixed hyperlipidemia Low fat iet - atorvastatin (LIPITOR) 20 MG tablet; Take 1 tablet (20 mg total) by mouth daily. Must be seen  for any further refills  Dispense: 90 tablet; Refill: 0 - fenofibrate (TRICOR) 145 MG tablet; Take 1 tablet (145 mg total) by mouth daily.  Dispense: 30 tablet; Refill: 5 - Lipid panel  3. Coronary artery disease involving native coronary artery of native heart without angina pectoris Keep follow up with cardiology  4. Diverticulosis Watch diet to prevent flare up  5. Gastroesophageal reflux disease, unspecified whether esophagitis present Avoid spicy foods Do not eat 2 hours prior to bedtime - omeprazole (PRILOSEC OTC) 20 MG tablet; Take 1 tablet (20 mg total) by mouth 2 (two) times daily. TAKE ON AN EMPTY STOMACH. THEN WAIT ONE HOUR TO EAT  Dispense: 60 tablet; Refill: 2  6. Gout, unspecified cause, unspecified chronicity, unspecified site Low purine diet - allopurinol (ZYLOPRIM) 300 MG tablet; Take 0.5 tablets (150 mg total) by mouth daily.  Dispense: 90 tablet; Refill: 1  7. Morbid obesity ([place]) Discussed diet and exercise for person with BMI >25 Will recheck weight in 3-6 months   Labs pending Health Maintenance reviewed Diet and exercise encouraged  Follow up plan: 6 months   [name] Done, FNP

## 2021-02-22 LAB — CMP14+EGFR
ALT: 42 IU/L (ref 0–44)
AST: 30 IU/L (ref 0–40)
Albumin/Globulin Ratio: 1.8 (ref 1.2–2.2)
Albumin: 4.6 g/dL (ref 3.8–4.9)
Alkaline Phosphatase: 91 IU/L (ref 44–121)
BUN/Creatinine Ratio: 21 (ref 10–24)
BUN: 22 mg/dL (ref 8–27)
Bilirubin Total: 0.4 mg/dL (ref 0.0–1.2)
CO2: 22 mmol/L (ref 20–29)
Calcium: 9.8 mg/dL (ref 8.6–10.2)
Chloride: 100 mmol/L (ref 96–106)
Creatinine, Ser: 1.04 mg/dL (ref 0.76–1.27)
Globulin, Total: 2.5 g/dL (ref 1.5–4.5)
Glucose: 111 mg/dL — ABNORMAL HIGH (ref 65–99)
Potassium: 4.8 mmol/L (ref 3.5–5.2)
Sodium: 141 mmol/L (ref 134–144)
Total Protein: 7.1 g/dL (ref 6.0–8.5)
eGFR: 82 mL/min/{1.73_m2} (ref >59)

## 2021-02-22 LAB — LIPID PANEL
Chol/HDL Ratio: 5 ratio (ref 0.0–5.0)
Cholesterol, Total: 232 mg/dL — ABNORMAL HIGH (ref 100–199)
HDL: 46 mg/dL (ref >39)
LDL Chol Calc (NIH): 101 mg/dL — ABNORMAL HIGH (ref 0–99)
Triglycerides: 506 mg/dL — ABNORMAL HIGH (ref 0–149)
VLDL Cholesterol Cal: 85 mg/dL — ABNORMAL HIGH (ref 5–40)

## 2021-02-22 LAB — CBC WITH DIFFERENTIAL/PLATELET
Basophils Absolute: 0.1 10*3/uL (ref 0.0–0.2)
Basos: 1 %
EOS (ABSOLUTE): 0.4 10*3/uL (ref 0.0–0.4)
Eos: 5 %
Hematocrit: 46.1 % (ref 37.5–51.0)
Hemoglobin: 15.4 g/dL (ref 13.0–17.7)
Immature Grans (Abs): 0 10*3/uL (ref 0.0–0.1)
Immature Granulocytes: 0 %
Lymphocytes Absolute: 1.8 10*3/uL (ref 0.7–3.1)
Lymphs: 21 %
MCH: 32.1 pg (ref 26.6–33.0)
MCHC: 33.4 g/dL (ref 31.5–35.7)
MCV: 96 fL (ref 79–97)
Monocytes Absolute: 0.8 10*3/uL (ref 0.1–0.9)
Monocytes: 9 %
Neutrophils Absolute: 5.4 10*3/uL (ref 1.4–7.0)
Neutrophils: 64 %
Platelets: 233 10*3/uL (ref 150–450)
RBC: 4.8 x10E6/uL (ref 4.14–5.80)
RDW: 12 % (ref 11.6–15.4)
WBC: 8.4 10*3/uL (ref 3.4–10.8)

## 2021-04-16 ENCOUNTER — Other Ambulatory Visit: Payer: Self-pay | Admitting: Nurse Practitioner

## 2021-04-16 DIAGNOSIS — I10 Essential (primary) hypertension: Secondary | ICD-10-CM

## 2021-05-01 DIAGNOSIS — L570 Actinic keratosis: Secondary | ICD-10-CM | POA: Diagnosis not present

## 2021-05-01 DIAGNOSIS — X32XXXA Exposure to sunlight, initial encounter: Secondary | ICD-10-CM | POA: Diagnosis not present

## 2021-05-01 DIAGNOSIS — L718 Other rosacea: Secondary | ICD-10-CM | POA: Diagnosis not present

## 2021-05-22 ENCOUNTER — Other Ambulatory Visit: Payer: Self-pay | Admitting: Nurse Practitioner

## 2021-05-22 DIAGNOSIS — E782 Mixed hyperlipidemia: Secondary | ICD-10-CM

## 2021-06-11 ENCOUNTER — Other Ambulatory Visit: Payer: Self-pay | Admitting: Nurse Practitioner

## 2021-06-11 DIAGNOSIS — I10 Essential (primary) hypertension: Secondary | ICD-10-CM

## 2021-07-25 ENCOUNTER — Ambulatory Visit (INDEPENDENT_AMBULATORY_CARE_PROVIDER_SITE_OTHER): Payer: Medicare Other

## 2021-07-25 DIAGNOSIS — Z0000 Encounter for general adult medical examination without abnormal findings: Secondary | ICD-10-CM

## 2021-07-25 NOTE — Progress Notes (Signed)
MEDICARE ANNUAL WELLNESS VISIT  07/25/2021  Telephone Visit Disclaimer This Medicare AWV was conducted by telephone due to national recommendations for restrictions regarding the COVID-19 Pandemic (e.g. social distancing).  I verified, using two identifiers, that I am speaking with Donald Butler or their authorized healthcare agent. I discussed the limitations, risks, security, and privacy concerns of performing an evaluation and management service by telephone and the potential availability of an in-person appointment in the future. The patient expressed understanding and agreed to proceed.  Location of Patient: Home Location of Provider (nurse):  [facility]  Subjective:    Donald Butler is a 59 y.o. male patient of Donald Butler, Why who had a Medicare Annual Wellness Visit today via telephone. Donald Butler is Retired and lives with their spouse. He has two children. He reports that he is socially active and does interact with friends/family regularly. He is minimally physically active and enjoys sports shooting.  Patient Care Team: Donald Pretty, FNP as PCP - General (Family Medicine) Donald Breeding, MD as PCP - Cardiology (Cardiology)  Advanced Directives 07/25/2021  Does Patient Have a Medical Advance Directive? No  Would patient like information on creating a medical advance directive? No - Patient declined    Hospital Utilization Over the Past 12 Months: # of hospitalizations or ER visits: 0 # of surgeries: 0  Review of Systems    Patient reports that his overall health is worse compared to last year.  History obtained from chart review and the patient  Patient Reported Readings (BP, Pulse, CBG, Weight, etc) none  Pain Assessment Pain : 0-10 Pain Score: 5  Pain Type: Chronic pain Pain Location: Leg Pain Orientation: Left Pain Descriptors / Indicators: Numbness, Sharp Pain Onset: Other (comment) Pain Frequency: Constant Pain Relieving Factors: staying off  feet  Pain Relieving Factors: staying off feet  Current Medications & Allergies (verified) Allergies as of 07/25/2021       Reactions   Penicillins Hives, Shortness Of Breath, Swelling        Medication List        Accurate as of July 25, 2021  1:31 PM. If you have any questions, ask your nurse or doctor.          albuterol 108 (90 Base) MCG/ACT inhaler Commonly known as: VENTOLIN HFA Inhale 2 puffs into the lungs every 6 (six) hours as needed for wheezing or shortness of breath.   allopurinol 300 MG tablet Commonly known as: ZYLOPRIM Take 0.5 tablets (150 mg total) by mouth daily.   amLODipine 10 MG tablet Commonly known as: NORVASC Take 1 tablet (10 mg total) by mouth daily.   aspirin EC 81 MG tablet Take 81 mg by mouth daily.   atorvastatin 20 MG tablet Commonly known as: LIPITOR Take 1 tablet (20 mg total) by mouth daily.   fenofibrate 145 MG tablet Commonly known as: TRICOR Take 1 tablet (145 mg total) by mouth daily.   HYDROcodone-acetaminophen 10-325 MG tablet Commonly known as: NORCO Take 1 tablet by mouth every 8 (eight) hours as needed.   lisinopril 40 MG tablet Commonly known as: ZESTRIL Take 1 tablet by mouth once daily   loratadine 10 MG tablet Commonly known as: CLARITIN Take 10 mg by mouth daily.   multivitamin tablet Take 1 tablet by mouth daily.   omeprazole 20 MG tablet Commonly known as: PRILOSEC OTC Take 1 tablet (20 mg total) by mouth 2 (two) times daily. TAKE ON AN EMPTY STOMACH. 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   Past Surgical History:  Procedure Laterality Date   BACK SURGERY  1982,  1983   ruptured disk  x 2   CARPAL TUNNEL RELEASE Left    COLONOSCOPY     EXTERNAL EAR SURGERY     KNEE ARTHROSCOPY  2006, 2008   x 2  left   MASTOIDECTOMY  2013   right   OPEN ANTERIOR SHOULDER RECONSTRUCTION  1970   right   POLYPECTOMY     reconstruction left leg  2009   multiple surgery after accident   Family History  Problem Relation Age of Onset   Diabetes Father    Colon cancer Neg Hx    Stomach cancer Neg Hx    Esophageal cancer Neg Hx    Rectal cancer Neg Hx    Pancreatic cancer Neg Hx    Social History   Socioeconomic History   Marital status: Married    Spouse name: Not on file   Number of children: Not on file   Years of education: Not on file   Highest education level: Not on file  Occupational History   Not on file  Tobacco Use   Smoking status: Former   Smokeless tobacco: Former    Types: Chew    Quit date: 06/22/2012  Vaping Use   Vaping Use: Never used  Substance and Sexual Activity   Alcohol use: Yes    Alcohol/week: 21.0 standard drinks    Types: 21 Standard drinks or equivalent per week    Comment: occ   Drug use: No   Sexual activity: Not on file  Other Topics Concern   Not on file  Social History Narrative   Not on file   Social Determinants of Health   Financial Resource Strain: Not on file  Food Insecurity: Not on file  Transportation Needs: Not on file  Physical Activity: Not on file  Stress: Not on file  Social Connections: Not on file    Activities of Daily Living In your present state of health, do you have any difficulty performing the following activities: 07/25/2021  Hearing? N  Vision? N  Difficulty concentrating or making decisions? N  Walking or climbing stairs? Y  Dressing or bathing? N  Doing errands, shopping? N  Preparing Food and eating ? N  Using the Toilet? N  In the past six months, have you accidently leaked urine? N  Do you have problems with loss of bowel control? N  Managing your Medications? N   Managing your Finances? N  Housekeeping or managing your Housekeeping? N  Some recent data might be hidden   Patient has nerve pain in left leg and sometimes causes difficulty walking.  Patient Education/ Literacy How often do you need to have someone help you when you read instructions, pamphlets, or other written materials from your doctor or pharmacy?: 1 - Never  Exercise Current Exercise Habits: The patient does not participate in regular exercise at present, Exercise limited by: orthopedic condition(s)  Diet Patient reports consuming 2 meals a day and 1  snack(s) a day Patient reports that his primary diet is: Regular Patient reports that he does have regular access to food.   Depression Screen PHQ 2/9 Scores 07/25/2021 02/21/2021 10/23/2020 06/19/2020 05/21/2020 05/04/2020 04/09/2020  PHQ - 2 Score 0 0 0 0 0 0 0  PHQ- 9 Score - 0 - - - - -     Fall Risk Fall Risk  07/25/2021 02/21/2021 10/23/2020 06/19/2020 12/16/2019  Falls in the past year? 0 0 0 0 0  Number falls in past yr: - - - - -  Injury with Fall? - - - - -  Follow up Falls evaluation completed - - - -     Objective:  Donald Butler seemed alert and oriented and he participated appropriately during our telephone visit.  Blood Pressure Weight BMI  BP Readings from Last 3 Encounters:  02/21/21 134/75  11/02/20 (!) 150/92  10/23/20 129/82   Wt Readings from Last 3 Encounters:  02/21/21 242 lb (109.8 kg)  11/02/20 246 lb (111.6 kg)  10/23/20 243 lb (110.2 kg)   BMI Readings from Last 1 Encounters:  02/21/21 35.74 kg/m    *Unable to obtain current vital signs, weight, and BMI due to telephone visit type  Hearing/Vision  Diana did not seem to have difficulty with hearing/understanding during the telephone conversation Reports that he has not had a formal eye exam by an eye care professional within the past year Reports that he has not had a formal hearing evaluation within the past year *Unable to fully assess hearing  and vision during telephone visit type  Cognitive Function: 6CIT Screen 07/25/2021  What Year? 0 points  What month? 0 points  What time? 0 points  Count back from 20 0 points  Months in reverse 0 points  Repeat phrase 0 points  Total Score 0   (Normal:0-7, Significant for Dysfunction: >8)  Normal Cognitive Function Screening: Yes   Immunization & Health Maintenance Record Immunization History  Administered Date(s) Administered   Moderna Sars-Covid-2 Vaccination 11/17/2019, 12/17/2019    Health Maintenance  Topic Date Due   Pneumococcal Vaccine 68-86 Years old (1 - PCV) Never done   Zoster Vaccines- Shingrix (1 of 2) Never done   COVID-19 Vaccine (3 - Booster for Moderna series) 02/11/2020   TETANUS/TDAP  10/23/2021 (Originally 03/05/1979)   Hepatitis C Screening  10/23/2021 (Originally 03/04/1978)   HIV Screening  10/23/2021 (Originally 03/05/1975)   INFLUENZA VACCINE  11/15/2021 (Originally 03/18/2021)   COLONOSCOPY (Pts 45-94yrs Insurance coverage will need to be confirmed)  06/22/2022   HPV VACCINES  Aged Out       Assessment  This is a routine wellness examination for [employer].  Health Maintenance: Due or Overdue Health Maintenance Due  Topic Date Due   Pneumococcal Vaccine 51-24 Years old (1 - PCV) Never done   Zoster Vaccines- Shingrix (1 of 2) Never done   COVID-19 Vaccine (3 - Booster for Moderna series) 02/11/2020    Donald Butler does not need a referral for Community Assistance: Care Management:   no Social Work:    no Prescription Assistance:  no Nutrition/Diabetes Education:  no   Plan:  Personalized Goals  Goals Addressed             This Visit's Progress    Patient Stated       07/25/2021 AWV Goal: Exercise for General Health  Patient will verbalize understanding of the benefits of increased physical activity: Exercising regularly is important. It will  improve your overall fitness, flexibility, and endurance. Regular exercise also  will improve your overall health. It can help you control your weight, reduce stress, and improve your bone density. Over the next year, patient will increase physical activity as tolerated with a goal of at least 150 minutes of moderate physical activity per week.  You can tell that you are exercising at a moderate intensity if your heart starts beating faster and you start breathing faster but can still hold a conversation. Moderate-intensity exercise ideas include: Walking 1 mile (1.6 km) in about 15 minutes Biking Hiking Golfing Dancing Water aerobics Patient will verbalize understanding of everyday activities that increase physical activity by providing examples like the following: Yard work, such as: Sales promotion account executive Gardening Washing windows or floors Patient will be able to explain general safety guidelines for exercising:  Before you start a new exercise program, talk with your health care provider. Do not exercise so much that you hurt yourself, feel dizzy, or get very short of breath. Wear comfortable clothes and wear shoes with good support. Drink plenty of water while you exercise to prevent dehydration or heat stroke. Work out until your breathing and your heartbeat get faster.        Personalized Health Maintenance & Screening Recommendations  Pneumococcal vaccine  Shingrix vaccine  Lung Cancer Screening Recommended: no (Low Dose CT Chest recommended if Age 2-80 years, 30 pack-year currently smoking OR have quit w/in past 15 years) Hepatitis C Screening recommended: no HIV Screening recommended: no  Advanced Directives: Written information was not prepared per patient's request.  Referrals & Orders No orders of the defined types were placed in this encounter.   Follow-up Plan Follow-up with Donald Pretty, FNP as planned    I have personally reviewed and noted the  following in the patient's chart:   Medical and social history Use of alcohol, tobacco or illicit drugs  Current medications and supplements Functional ability and status Nutritional status Physical activity Advanced directives List of other physicians Hospitalizations, surgeries, and ER visits in previous 12 months Vitals Screenings to include cognitive, depression, and falls Referrals and appointments  In addition, I have reviewed and discussed with Donald Butler certain preventive protocols, quality metrics, and best practice recommendations. A written personalized care plan for preventive services as well as general preventive health recommendations is available and can be mailed to the patient at his request.      Felicity Coyer, LPN    [id number]

## 2021-08-22 ENCOUNTER — Telehealth: Payer: Self-pay | Admitting: Nurse Practitioner

## 2021-08-22 NOTE — Telephone Encounter (Signed)
Does he need to come back at 6 months or can he wait a year?

## 2021-08-22 NOTE — Telephone Encounter (Signed)
Since he os on cholesterol meds he really need soto be seen every 6 months

## 2021-08-22 NOTE — Telephone Encounter (Signed)
Spoke with patient and advised that she would like to see him every 6 months. Patient verbalized understanding

## 2021-08-26 ENCOUNTER — Ambulatory Visit: Payer: Self-pay | Admitting: Nurse Practitioner

## 2021-09-02 ENCOUNTER — Other Ambulatory Visit: Payer: Self-pay | Admitting: Nurse Practitioner

## 2021-09-02 ENCOUNTER — Encounter: Payer: Self-pay | Admitting: Nurse Practitioner

## 2021-09-02 DIAGNOSIS — I10 Essential (primary) hypertension: Secondary | ICD-10-CM

## 2021-09-02 NOTE — Telephone Encounter (Signed)
One refill given patient will need to be seen for any further refills.

## 2021-09-02 NOTE — Telephone Encounter (Signed)
NO ANSWER SENT LETTER

## 2021-09-03 IMAGING — DX DG ABDOMEN 2V
3 series · 4 of 4 positions shown · non-contrast
Comparison: 05/03/2019

CLINICAL DATA: Abdominal pain

EXAM:
ABDOMEN - 2 VIEW

[abdomen erect]
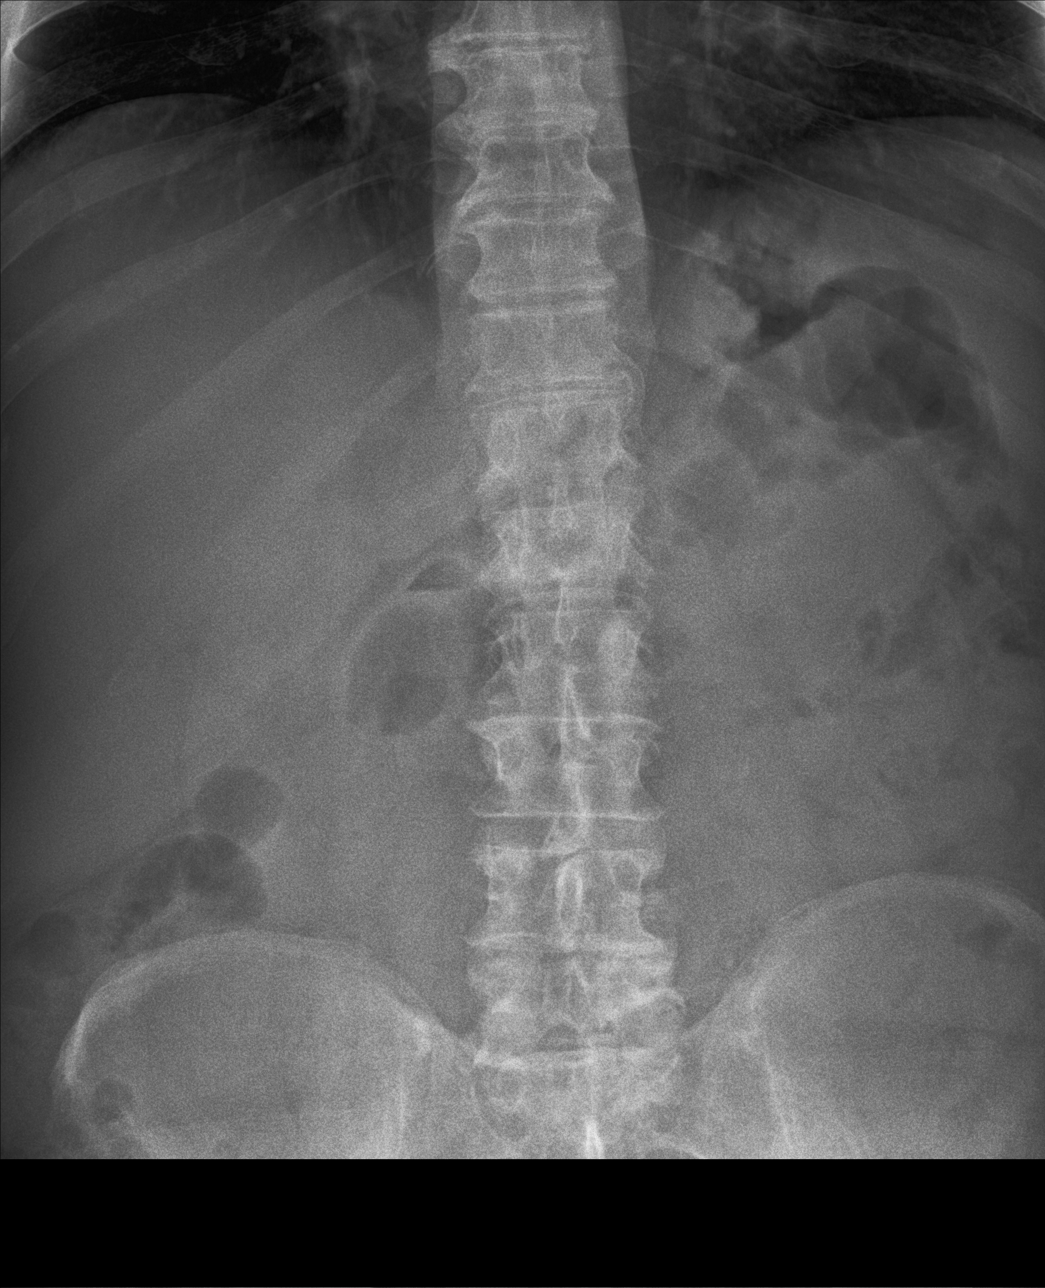

[abdomen supine (1 of 2)]
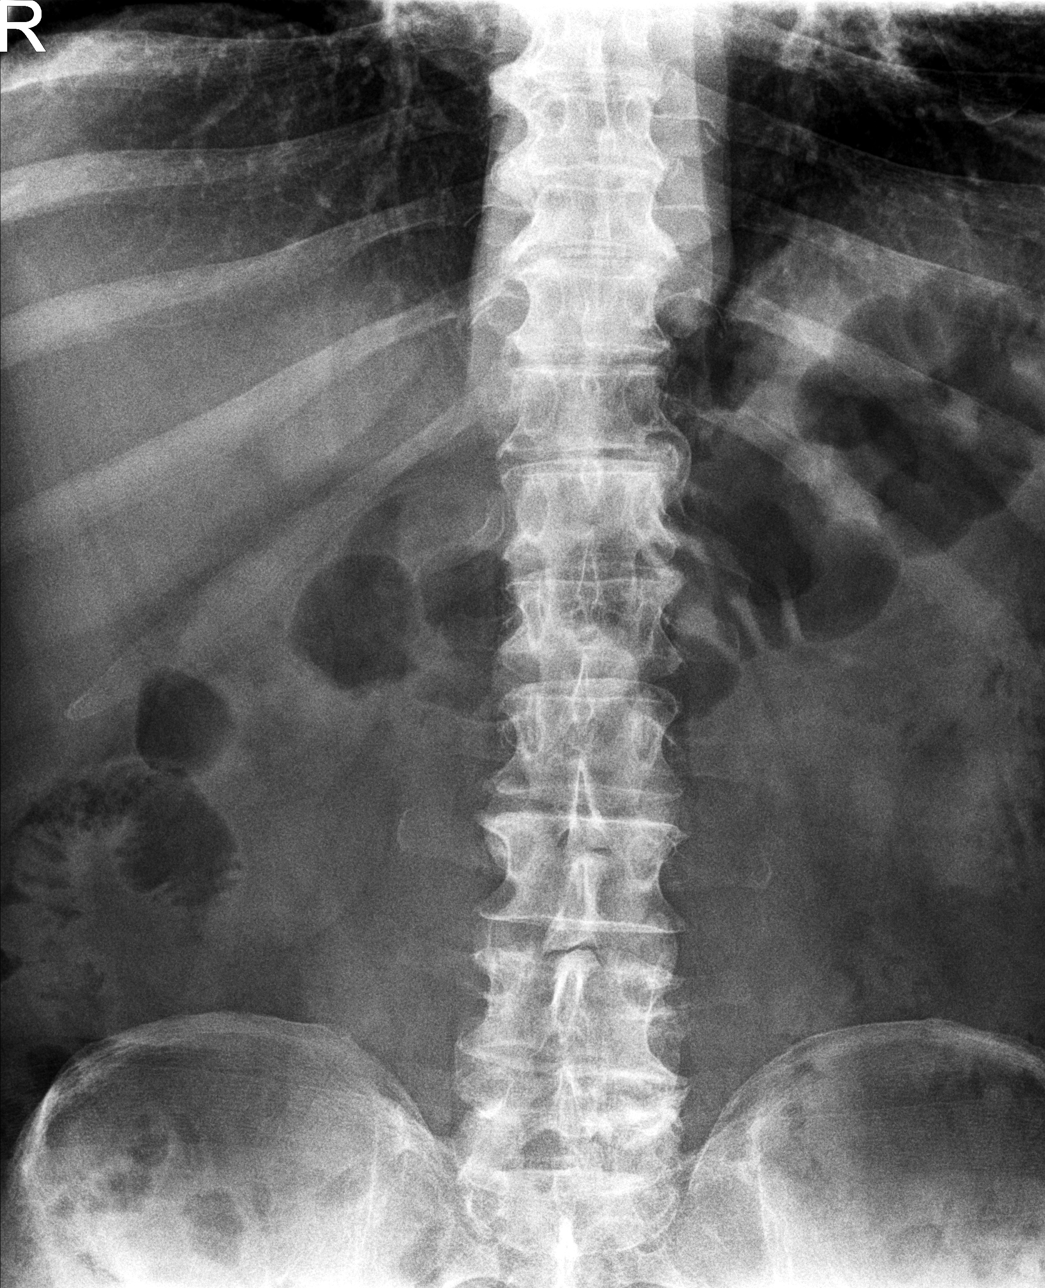

[Series 3: abdomen supine · 0.14mm/px · 2 of 2 slices shown (2 of 2)]
[im 1/2]
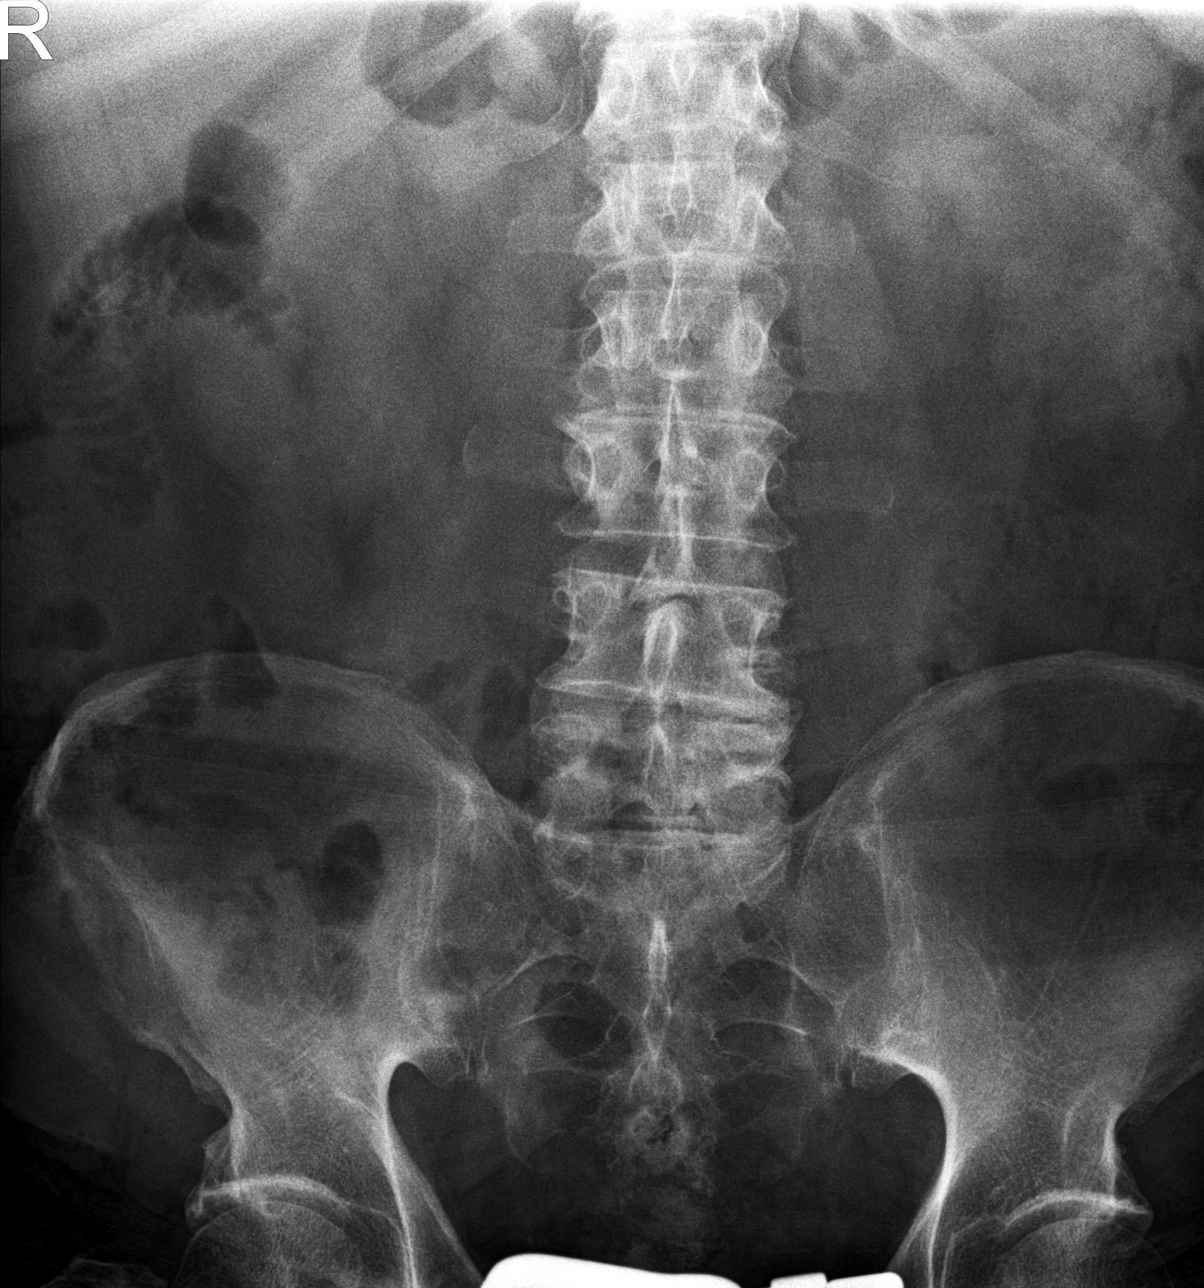
[im 2/2]
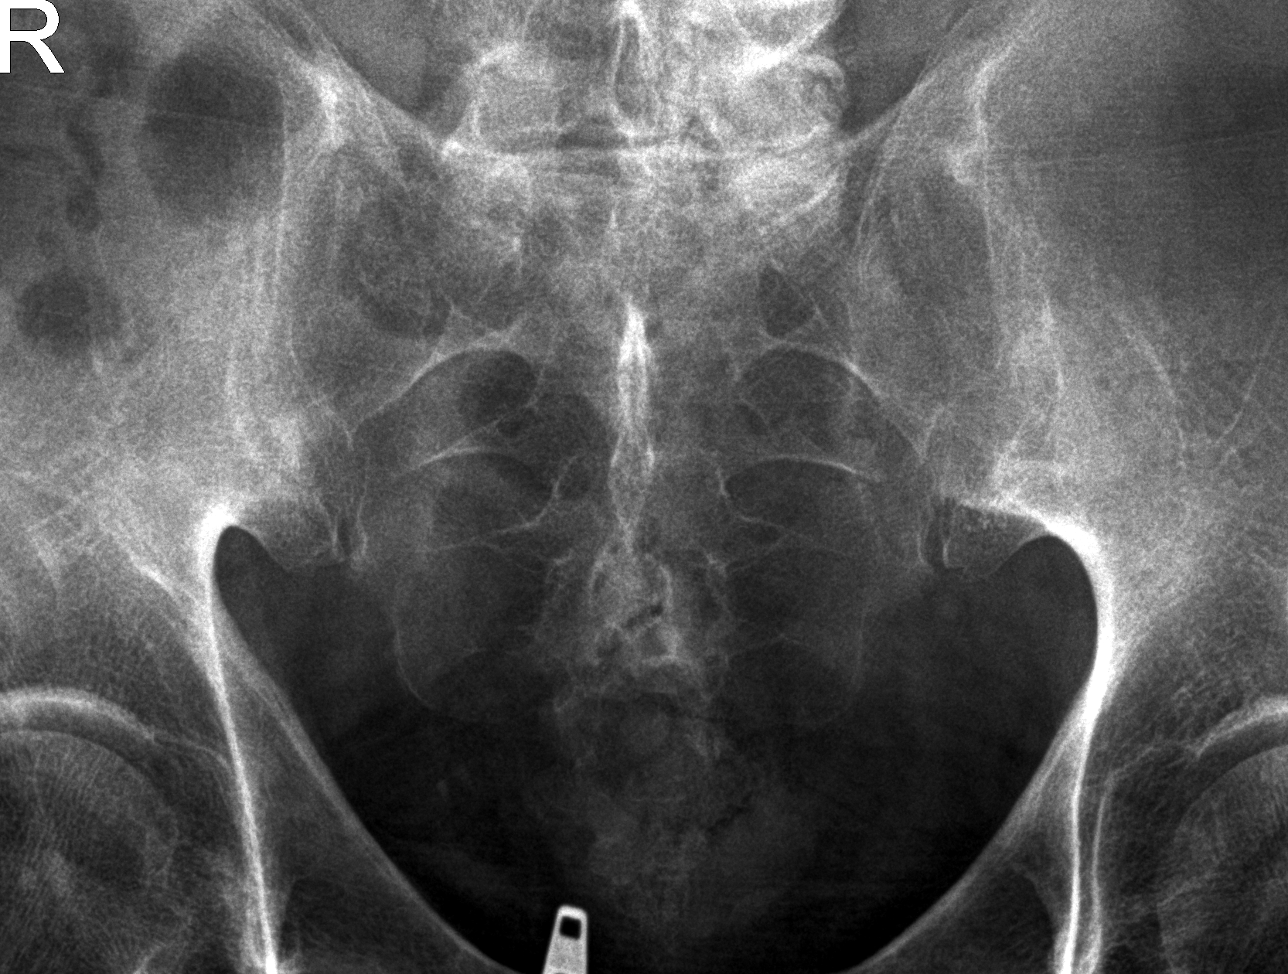

[4 of 4 positions shown; findings below may reference images not displayed]

FINDINGS: The pelvis is excluded from view. The flanks are excluded from view.
The visualized abdominal gas pattern is normal. No gross free
intraperitoneal gas. No organomegaly. No abnormal calcification
within the visualized abdomen. Osseous structures are
age-appropriate.
IMPRESSION: Nonobstructive abdominal gas pattern.

## 2021-12-03 ENCOUNTER — Ambulatory Visit (INDEPENDENT_AMBULATORY_CARE_PROVIDER_SITE_OTHER): Payer: Medicare Other | Admitting: *Deleted

## 2021-12-03 ENCOUNTER — Telehealth: Payer: Self-pay | Admitting: Cardiology

## 2021-12-03 DIAGNOSIS — R000 Tachycardia, unspecified: Secondary | ICD-10-CM | POA: Diagnosis not present

## 2021-12-03 DIAGNOSIS — R002 Palpitations: Secondary | ICD-10-CM | POA: Diagnosis not present

## 2021-12-03 NOTE — Telephone Encounter (Signed)
Spoke with the patient who states that he woke up last night and could feel his heart racing. He states that he check an EKG on his apple watch and it showed that he was in Afib. He states that heart rate has been between 123-165 bpm. He states that he has been a bit lightheaded. He will go to the [place] office today at 1pm for a nurse visit to check EKG.  ?

## 2021-12-03 NOTE — Progress Notes (Signed)
Patient in office for EKG - EKG in office NSR with HR of 85.  Patient reports Afib readings shown by his apple watch with HR's running from 117-160's.  Patient stated that he did feel better while sitting in the lobby - better than he had been feeling last night.  EKG scanned into Epic & sent to provider for review.   ?

## 2021-12-03 NOTE — Telephone Encounter (Signed)
Patient c/o Palpitations:  High priority if patient c/o lightheadedness, shortness of breath, or chest pain ? ?How long have you had palpitations/irregular HR/ Afib? Are you having the symptoms now? Since 12:30 AM ? ?Are you currently experiencing lightheadedness, SOB or CP? Seems to be a little lightheaded  ? ?Do you have a history of afib (atrial fibrillation) or irregular heart rhythm? No  ? ?Have you checked your BP or HR? (document readings if available):  ?HR 165 at 7:35 AM per apple watch  ?HR 132 at time of call per apple watch ? ?Are you experiencing any other symptoms? Has acid reflux really bad  ? ?Transferring to triage due to being STAT.  ?

## 2021-12-10 DIAGNOSIS — R931 Abnormal findings on diagnostic imaging of heart and coronary circulation: Secondary | ICD-10-CM | POA: Insufficient documentation

## 2021-12-10 NOTE — Progress Notes (Signed)
?  ?Cardiology Office Note ? ? ?Date:  12/11/2021  ? ?ID:  Donald Butler, DOB 1961-07-16, MRN [id number] ? ?PCP:  Donald Pretty, FNP  ?Cardiologist:   Minus Breeding, MD ? ? ?Chief Complaint  ?Patient presents with  ? Palpitations  ? ? ?  ?History of Present Illness:. ?Donald Butler is a 61 y.o. male who presents for dizziness.    Coronary CT angiography showed a coronary artery calcium score of 856 Agatston units placing him in the 97th percentile for age and gender, suggesting a high risk for future cardiac events.  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 Echocardiogram on 06/17/2018 showed normal left ventricular systolic function and regional wall motion, LVEF 60 to 65%, and mild mitral regurgitation.    ? ?Since I last saw him he has had palpitations.  He is walking 1 time he is heart rate was 150 for 10 minutes apparently while he was asleep.  Another time he woke up with palpitations and he put his watch on and in fact there was an irregular heartbeat that looks like atrial fibrillation.  Follow-up EKG that morning did not demonstrate any fibrillation.  He said this is happened rarely.  Has not had any presyncope or syncope.  He is not having any chest discomfort, neck or arm discomfort.  He did feel like his heart was skipping like a "bird and a wire."  He states physically active but not exercising routinely.  He is limited by some back and joint pains ? ?Past Medical History:  ?Diagnosis Date  ? Allergy   ? Elevated coronary artery calcium score   ? GERD (gastroesophageal reflux disease)   ? Gout   ? Hyperlipidemia   ? Hypertension   ? Internal hemorrhoid   ? Nerve damage   ? left leg  ? Neuromuscular disorder ([place])   ? 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 ? ?Past Surgical History:  ?Procedure Laterality Date  ? [place]  ? ruptured disk  x 2  ? CARPAL TUNNEL RELEASE Left   ? COLONOSCOPY    ?  EXTERNAL EAR SURGERY    ? KNEE ARTHROSCOPY  2006, 2008  ? x 2  left  ? MASTOIDECTOMY  2013  ? right  ? OPEN ANTERIOR SHOULDER RECONSTRUCTION  1970  ? right  ? POLYPECTOMY    ? reconstruction left leg  2009  ? multiple surgery after accident  ? ? ? ?Current Outpatient Medications  ?Medication Sig Dispense Refill  ? albuterol (PROVENTIL HFA;VENTOLIN HFA) 108 (90 Base) MCG/ACT inhaler Inhale 2 puffs into the lungs every 6 (six) hours as needed for wheezing or shortness of breath. 1 Inhaler 2  ? allopurinol (ZYLOPRIM) 300 MG tablet Take 0.5 tablets (150 mg total) by mouth daily. (Patient taking differently: Take 150 mg by mouth daily as needed.) 90 tablet 1  ? amLODipine-benazepril (LOTREL) 10-40 MG capsule Take 1 capsule by mouth daily. 90 capsule 3  ? aspirin EC 81 MG tablet Take 81 mg by mouth daily.     ? atorvastatin (LIPITOR) 40 MG tablet Take 1 tablet (40 mg total) by mouth daily. 90 tablet 3  ? colchicine 0.6 MG tablet Take 0.6 mg by mouth daily as needed.    ? fenofibrate (TRICOR) 145 MG tablet Take 1 tablet (145 mg total) by mouth daily. 30 tablet 5  ? loratadine (CLARITIN) 10 MG tablet Take  10 mg by mouth daily.     ? Multiple Vitamin (MULTIVITAMIN) tablet Take 1 tablet by mouth daily.     ? omeprazole (PRILOSEC OTC) 20 MG tablet Take 1 tablet (20 mg total) by mouth 2 (two) times daily. TAKE ON AN EMPTY STOMACH. THEN WAIT ONE HOUR TO EAT 60 tablet 2  ? HYDROcodone-acetaminophen (NORCO) 10-325 MG tablet Take 1 tablet by mouth every 8 (eight) hours as needed. (Patient not taking: Reported on 12/11/2021) 30 tablet 0  ? ?No current facility-administered medications for this visit.  ? ? ?Allergies:   Penicillins  ? ?ROS:  Please see the history of present illness.   Otherwise, review of systems are positive for belching.   All other systems are reviewed and negative.  ? ? ?PHYSICAL EXAM: ?VS:  BP (!) 140/98   Pulse 88   Ht '5\' 8"'$  (1.727 m)   Wt 249 lb (112.9 kg)   BMI 37.86 kg/m?  , BMI Body mass index is 37.86  kg/m?. ?GENERAL:  Well appearing ?NECK:  No jugular venous distention, waveform within normal limits, carotid upstroke brisk and symmetric, no bruits, no thyromegaly ?LUNGS:  Clear to auscultation bilaterally ?CHEST:  Unremarkable ?HEART:  PMI not displaced or sustained,S1 and S2 within normal limits, no S3, no S4, no clicks, no rubs, no murmurs ?ABD:  Flat, positive bowel sounds normal in frequency in pitch, no bruits, no rebound, no guarding, no midline pulsatile mass, no hepatomegaly, no splenomegaly ?EXT:  2 plus pulses throughout, no edema, no cyanosis no clubbing ? ? ?EKG:  EKG is not ordered today. ?The ekg ordered for 18 2023 demonstrates sinus rhythm, rate 85, axis within normal limits, intervals within normal limits, no acute ST-T wave changes. ? ? ?Recent Labs: ?02/21/2021: ALT 42; BUN 22; Creatinine, Ser 1.04; Hemoglobin 15.4; Platelets 233; Potassium 4.8; Sodium 141  ? ? ?Lipid Panel ?   ?Component Value Date/Time  ? CHOL 232 (H) 02/21/2021 1232  ? TRIG 506 (H) 02/21/2021 1232  ? HDL 46 02/21/2021 1232  ? CHOLHDL 5.0 02/21/2021 1232  ? LDLCALC 101 (H) 02/21/2021 1232  ? ?  ? ?Wt Readings from Last 3 Encounters:  ?12/11/21 249 lb (112.9 kg)  ?02/21/21 242 lb (109.8 kg)  ?11/02/20 246 lb (111.6 kg)  ?  ? ? ?Other studies Reviewed: ?Additional studies/ records that were reviewed today include: Labs. ?Review of the above records demonstrates:  Please see elsewhere in the note.   ? ? ?ASSESSMENT AND PLAN: ? ?ELEVATED CORONARY CALCIUM :   He has mild nonobstructive coronary disease with an elevated calcium score.  Spent a long time again talking to him about this today.  His MESA score is about 30% 20-year risk of cardiovascular events.  I demonstrated to him that if he gets his cholesterol down and his blood pressure at target he can reduce that to about 16%.  We talked at length about primary risk reduction.  ? ?Hypertension:    His blood pressure is not controlled.  Like to take 1 pill.  I will stop the  lisinopril and the amlodipine and start giving him Lotrel 10/40 and he can keep a blood pressure diary. ? ?Mixed dyslipidemia:   LDL was not at target but he has not been taking his medications.  I think he do better on a moderate dose Lipitor with goal LDL less than 70 and I will switch him to 40 mg of Lipitor and repeat a lipid profile in 10 weeks.  ? ?  Palpitations: He looks like he might have atrial fibrillation and he will wear a 4-week event monitor and might need DOAC.  I reviewed the rhythm strips on his phone.  I think it looks like fibrillation but it is a little bit difficult to tell.   ? ?Current medicines are reviewed at length with the patient today.  The patient does not have concerns regarding medicines. ? ?The following changes have been made: As above ? ?Labs/ tests ordered today include:  ? ?Orders Placed This Encounter  ?Procedures  ? Lipid panel  ? CARDIAC EVENT MONITOR  ? ? ? ?Disposition:   FU with me in 4 months ? ? ?Signed, ?Minus Breeding, MD  ?12/11/2021 11:13 AM    ?[place] ? ? ? ?

## 2021-12-11 ENCOUNTER — Ambulatory Visit: Payer: Medicare Other | Admitting: Cardiology

## 2021-12-11 ENCOUNTER — Encounter: Payer: Self-pay | Admitting: Cardiology

## 2021-12-11 VITALS — BP 140/98 | HR 88 | Ht 68.0 in | Wt 249.0 lb

## 2021-12-11 DIAGNOSIS — Z79899 Other long term (current) drug therapy: Secondary | ICD-10-CM

## 2021-12-11 DIAGNOSIS — R002 Palpitations: Secondary | ICD-10-CM

## 2021-12-11 DIAGNOSIS — R931 Abnormal findings on diagnostic imaging of heart and coronary circulation: Secondary | ICD-10-CM

## 2021-12-11 DIAGNOSIS — E785 Hyperlipidemia, unspecified: Secondary | ICD-10-CM

## 2021-12-11 DIAGNOSIS — I10 Essential (primary) hypertension: Secondary | ICD-10-CM

## 2021-12-11 MED ORDER — AMLODIPINE BESY-BENAZEPRIL HCL 10-40 MG PO CAPS: 1.0000 | ORAL_CAPSULE | Freq: Every day | ORAL | 3 refills | Status: DC

## 2021-12-11 MED ORDER — ATORVASTATIN CALCIUM 40 MG PO TABS: 40.0000 mg | ORAL_TABLET | Freq: Every day | ORAL | 3 refills | Status: DC

## 2021-12-11 NOTE — Patient Instructions (Addendum)
Medication Instructions:  ?Please discontinue your Amlodipine and Lisinopril. ?Start Lotrel 10/40 mg daily. ?Increase Atorvastatin 40 mg daily. ?Continue all other medications as listed. ? ?*If you need a refill on your cardiac medications before your next appointment, please call your pharmacy* ? ?Lab Work: ?Please have blood work in 10 weeks (around 02/19/2022) Lipid profile at [facility] ? ?If you have labs (blood work) drawn today and your tests are completely normal, you will receive your results only by: ?MyChart Message (if you have MyChart) OR ?A paper copy in the mail ?If you have any lab test that is abnormal or we need to change your treatment, we will call you to review the results. ? ?Testing/Procedures: ?[employer] Cardiac Event Monitor Instructions ?Your physician has requested you wear your cardiac event monitor for 30 days, (1-30). ?[employer] may call or text to confirm a shipping address. The monitor will be sent to a land address via UPS. ?[employer] will not ship a monitor to a PO BOX. It typically takes 3-5 days to receive your monitor after it has ?been enrolled. [employer] will assist with USPS tracking if your package is delayed. The telephone number for ?[employer] is [phone]. ?Once you have received your monitor, please review the enclosed instructions. Instruction tutorials can also be ?viewed under help and settings on the enclosed cell phone. Your monitor has already been registered assigning ?a specific monitor serial # to you. ? ?Applying the monitor ?Remove cell phone from case and turn it on. The cell phone works as Dealer and needs to be within ?10 feet of you at all times. The cell phone will need to be charged on a daily basis. We recommend you plug the ?cell phone into the enclosed charger at your bedside table every night. ? ?Monitor batteries: You will receive two monitor batteries labelled #1 and #2. These are your recorders. Plug ?battery #2 onto the second  connection on the enclosed charger. Keep one battery on the charger at all times. ?This will keep the monitor battery deactivated. It will also keep it fully charged for when you need to switch ?your monitor batteries. A small light will be blinking on the battery emblem when it is charging. The light on the ?battery emblem will remain on when the battery is fully charged. ? ?Open package of a Monitor strip. Insert battery #1 into black hood on strip and gently squeeze monitor battery ?onto connection as indicated in instruction booklet. Set aside while preparing skin. ? ?Choose location for your strip, vertical or horizontal, as indicated in the instruction booklet. Shave to remove all ?hair from location. There cannot be any lotions, oils, powders, or colognes on skin where monitor is to be ?applied. Wipe skin clean with enclosed Saline wipe. Dry skin completely. ? ?Peel paper labeled #1 off the back of the Monitor strip exposing the adhesive. Place the monitor on the chest in ?the vertical or horizontal position shown in the instruction booklet. One arrow on the monitor strip must be ?pointing upward. Carefully remove paper labeled #2, attaching remainder of strip to your skin. Try not to ?create any folds or wrinkles in the strip as you apply it. ? ?Firmly press and release the circle in the center of the monitor battery. You will hear a small beep. This is ?turning the monitor battery on. The heart emblem on the monitor battery will light up every 5 seconds if the ?monitor battery in turned on and connected to the patient securely. Do not push and  hold the circle down as ?this turns the monitor battery off. The cell phone will locate the monitor battery. A screen will appear on the ?cell phone checking the connection of your monitor strip. This may read poor connection initially but change to ?good connection within the next minute. Once your monitor accepts the connection you will hear a series of 3 ?beeps  followed by a climbing crescendo of beeps. A screen will appear on the cell phone showing the two ?monitor strip placement options. Touch the picture that demonstrates where you applied the monitor strip. ? ?Your monitor strip and battery are waterproof. You are able to shower, bathe, or swim with the monitor on. ?They just ask you do not submerge deeper than 3 feet underwater. We recommend removing the monitor if ?you are swimming in a lake, river, or ocean. ? ?Your monitor battery will need to be switched to a fully charged monitor battery approximately once a week. ?The cell phone will alert you of an action which needs to be made. ? ?On the cell phone, tap for details to reveal connection status, monitor battery status, and cell phone battery ?status. The green dots indicates your monitor is in good status. A red dot indicates there is something that ?needs your attention. ? ?To record a symptom, click the circle on the monitor battery. In 30-60 seconds a list of symptoms will appear on ?the cell phone. Select your symptom and tap save. Your monitor will record a sustained or significant ?arrhythmia regardless of you clicking the button. Some patients do not feel the heart rhythm irregularities. ?[employer] will notify us of any serious or critical events. ? ?Refer to instruction booklet for instructions on switching batteries, changing strips, the Do not disturb or Pause ?features, or any additional questions. ? ?Call [employer] at [phone], to confirm your monitor is transmitting and record your baseline. They will ?answer any questions you may have regarding the monitor instructions at that time. ? ?Returning the monitor to [employer] ?Place all equipment back into blue box. Peel off strip of paper to expose adhesive and close box securely. There ?is a prepaid UPS shipping label on this box. Drop in a UPS drop box, or at a UPS facility like [employer]. You may ?also contact [employer] to arrange UPS to  pick up monitor package at your home. ? ? ?Follow-Up: ?At [facility], you and your health needs are our priority.  As part of our continuing mission to provide you with exceptional heart care, we have created designated Provider Care Teams.  These Care Teams include your primary Cardiologist (physician) and Advanced Practice Providers (APPs -  Physician Assistants and Nurse Practitioners) who all work together to provide you with the care you need, when you need it. ? ?We recommend signing up for the patient portal called "MyChart".  Sign up information is provided on this After Visit Summary.  MyChart is used to connect with patients for Virtual Visits (Telemedicine).  Patients are able to view lab/test results, encounter notes, upcoming appointments, etc.  Non-urgent messages can be sent to your provider as well.   ?To learn more about what you can do with MyChart, go to [email or web address].   ? ?Your next appointment:   ?3 month(s) ? ?The format for your next appointment:   ?In Person ? ?Provider:   ?Minus Breeding, MD{ ? ?Thank you for choosing [facility]!! ? ? ? ? ?Important Information About [employer] ? ? ? ? ?  ?

## 2021-12-23 ENCOUNTER — Ambulatory Visit (INDEPENDENT_AMBULATORY_CARE_PROVIDER_SITE_OTHER): Payer: Medicare Other

## 2021-12-23 DIAGNOSIS — R002 Palpitations: Secondary | ICD-10-CM

## 2022-01-03 ENCOUNTER — Other Ambulatory Visit: Payer: Medicare Other

## 2022-01-03 ENCOUNTER — Telehealth: Payer: Self-pay | Admitting: *Deleted

## 2022-01-03 DIAGNOSIS — I480 Paroxysmal atrial fibrillation: Secondary | ICD-10-CM | POA: Diagnosis not present

## 2022-01-03 NOTE — Telephone Encounter (Signed)
Spoke with pt, aware of dr hochrein's recommendations. Order placed for labs and she will go to [facility] to have labs drawn.

## 2022-01-03 NOTE — Telephone Encounter (Signed)
   Cardiac Monitor Alert  Date of alert:  01/03/2022   Patient Name: Donald Butler  DOB: [birth year]  MRN: [id number]   [facility] Cardiologist: Minus Breeding, MD  [facility] EP:  None    Monitor Information: Cardiac Event Monitor [[employer]]  Reason:  ? Atrial fib Ordering provider:  hochrein   Alert Atrial Fibrillation/Flutter This is the 1st alert for this rhythm.  The patient has no hx of Atrial Fibrillation/Flutter.  The patient is not currently on anticoagulation.  Next Cardiology Appointment   Date:  7/23  Provider:  hochrein  The patient was contacted today.  He is asymptomatic.   Other:  Spoke with patient, he was working in his yard, unloading mulch when the event occurred. He was tired but did not know he was out of rhythm. Discussed with dr croitoru(dod), he would like the patient to be seen to have a discussion regarding anti-coag. The patient has a follow up appointment with dr hochrein in [facility] 02/26/22. Will forward to dr hochrein to see if he wants the appointment moved up or wait until July when the entire monitor will be complete.  Fredia Beets, RN  01/03/2022 9:31 AM

## 2022-01-04 LAB — BASIC METABOLIC PANEL
BUN/Creatinine Ratio: 22 (ref 10–24)
BUN: 22 mg/dL (ref 8–27)
CO2: 21 mmol/L (ref 20–29)
Calcium: 9.1 mg/dL (ref 8.6–10.2)
Chloride: 100 mmol/L (ref 96–106)
Creatinine, Ser: 1 mg/dL (ref 0.76–1.27)
Glucose: 195 mg/dL — ABNORMAL HIGH (ref 70–99)
Potassium: 4.3 mmol/L (ref 3.5–5.2)
Sodium: 138 mmol/L (ref 134–144)
eGFR: 86 mL/min/{1.73_m2} (ref >59)

## 2022-01-04 LAB — CBC
Hematocrit: 44 % (ref 37.5–51.0)
Hemoglobin: 15.4 g/dL (ref 13.0–17.7)
MCH: 32.9 pg (ref 26.6–33.0)
MCHC: 35 g/dL (ref 31.5–35.7)
MCV: 94 fL (ref 79–97)
Platelets: 222 10*3/uL (ref 150–450)
RBC: 4.68 x10E6/uL (ref 4.14–5.80)
RDW: 11.8 % (ref 11.6–15.4)
WBC: 7.5 10*3/uL (ref 3.4–10.8)

## 2022-01-08 ENCOUNTER — Encounter: Payer: Self-pay | Admitting: Cardiology

## 2022-01-08 ENCOUNTER — Other Ambulatory Visit: Payer: Self-pay | Admitting: *Deleted

## 2022-01-08 MED ORDER — APIXABAN 5 MG PO TABS: 5.0000 mg | ORAL_TABLET | Freq: Two times a day (BID) | ORAL | 6 refills | Status: DC

## 2022-01-08 NOTE — Telephone Encounter (Signed)
error 

## 2022-01-08 NOTE — Telephone Encounter (Signed)
Spoke with pt, labs reviewed and per dr hochrein, the patient will start eliquis 5 mg twice daily. New script sent to the pharmacy. Savings card placed in the mail to the patient. Patient assistance paperwork also enclosed.

## 2022-01-17 ENCOUNTER — Telehealth: Payer: Self-pay | Admitting: Cardiology

## 2022-01-17 NOTE — Telephone Encounter (Signed)
Patient c/o Palpitations:  High priority if patient c/o lightheadedness, shortness of breath, or chest pain  How long have you had palpitations/irregular HR/ Afib? Are you having the symptoms now? This morning  Are you currently experiencing lightheadedness, SOB or CP? SOB  Do you have a history of afib (atrial fibrillation) or irregular heart rhythm? Yes  Have you checked your BP or HR? (document readings if available): HR 166  Are you experiencing any other symptoms? Weak

## 2022-01-17 NOTE — Telephone Encounter (Signed)
Pt called reporting this morning he felt very weak and short of breath. He state he ran a strip and it showed afib with HR of 124-166. Pt current HR 81 and state he feel much better than he felt 30 mins ago and feel like he may be back in rhythm , but wanted to make Dr. Percival Spanish aware.  Will forward to MD

## 2022-01-20 NOTE — Telephone Encounter (Signed)
Pt state unfortunately he does not know how to navigate mychart in order to share strip from his apple watch. Nurse tried to explain, but pt state he is not Pharmacologist. Pt also wanted to make MD aware, that even though his watch report he is in a normal rhythm, his HR will increase to 130-170 with minimal exertion. However, pt state he currently feels over all well.  Nurse attempted to to move up scheduled appointment on 7/12, but pt state he is on his last day of wearing the monitor and would prefer Dr. Percival Spanish have the report prior to being seen.   Will forward to make aware  Minus Breeding, MD  You 17 hours ago (9:14 PM)   Does he have strips to show [place] and is he wearing a monitor.

## 2022-02-06 ENCOUNTER — Encounter: Payer: Self-pay | Admitting: *Deleted

## 2022-02-17 ENCOUNTER — Encounter: Payer: Self-pay | Admitting: Nurse Practitioner

## 2022-02-17 ENCOUNTER — Ambulatory Visit (INDEPENDENT_AMBULATORY_CARE_PROVIDER_SITE_OTHER): Payer: Medicare Other | Admitting: Nurse Practitioner

## 2022-02-17 DIAGNOSIS — M10171 Lead-induced gout, right ankle and foot: Secondary | ICD-10-CM | POA: Diagnosis not present

## 2022-02-17 MED ORDER — COLCHICINE 0.6 MG PO TABS: 0.6000 mg | ORAL_TABLET | Freq: Every day | ORAL | 1 refills | Status: DC | PRN

## 2022-02-17 NOTE — Progress Notes (Signed)
Virtual Visit  Note Due to COVID-19 pandemic this visit was conducted virtually. This visit type was conducted due to national recommendations for restrictions regarding the COVID-19 Pandemic (e.g. social distancing, sheltering in place) in an effort to limit this patient's exposure and mitigate transmission in our community. All issues noted in this document were discussed and addressed.  A physical exam was not performed with this format.  I connected with Donald Butler on 02/17/22 at 1:30 by telephone and verified that I am speaking with the correct person using two identifiers. Donald Butler is currently located at grocery store and no one is currently with him during visit. The provider, [name] Done, FNP is located in their office at time of visit.  I discussed the limitations, risks, security and privacy concerns of performing an evaluation and management service by telephone and the availability of in person appointments. I also discussed with the patient that there may be a patient responsible charge related to this service. The patient expressed understanding and agreed to proceed.   History and Present Illness:  Gout flare up, started Friday. He had some colchicine and took 2 but still hurting. Located in left foot. Rates pain 8/10. Wosrse when walking or standing. Sitting relieves pain some.       Review of Systems  Constitutional:  Negative for diaphoresis and weight loss.  Eyes:  Negative for blurred vision, double vision and pain.  Respiratory:  Negative for shortness of breath.   Cardiovascular:  Negative for chest pain, palpitations, orthopnea and leg swelling.  Gastrointestinal:  Negative for abdominal pain.  Skin:  Negative for rash.  Neurological:  Negative for dizziness, sensory change, loss of consciousness, weakness and headaches.  Endo/Heme/Allergies:  Negative for polydipsia. Does not bruise/bleed easily.  Psychiatric/Behavioral:  Negative for memory  loss. The patient does not have insomnia.   All other systems reviewed and are negative.    Observations/Objective: Alert and oriented- answers all questions appropriately No distress   Assessment and Plan: Donald Butler in today with chief complaint of No chief complaint on file.   1. Lead-induced acute gout of right foot, initial encounter Discuss eliquis with cardiology since he thinks is causing flare up Low purine diet Ice as needed Meds ordered this encounter  Medications   colchicine 0.6 MG tablet    Sig: Take 1 tablet (0.6 mg total) by mouth daily as needed.    Dispense:  30 tablet    Refill:  1    Order Specific Question:   Supervising Provider    Answer:   Caryl Pina A [[id number]]      Follow Up Instructions: prn    I discussed the assessment and treatment plan with the patient. The patient was provided an opportunity to ask questions and all were answered. The patient agreed with the plan and demonstrated an understanding of the instructions.   The patient was advised to call back or seek an in-person evaluation if the symptoms worsen or if the condition fails to improve as anticipated.  The above assessment and management plan was discussed with the patient. The patient verbalized understanding of and has agreed to the management plan. Patient is aware to call the clinic if symptoms persist or worsen. Patient is aware when to return to the clinic for a follow-up visit. Patient educated on when it is appropriate to go to the emergency department.   Time call ended:  1:42  I provided 12 minutes of  non face-to-face  time during this encounter.    [name] Done, FNP

## 2022-02-17 NOTE — Patient Instructions (Signed)
Gout  Gout is painful swelling of your joints. Gout is a type of arthritis. It is caused by having too much uric acid in your body. Uric acid is a chemical that is made when your body breaks down substances called purines. If your body has too much uric acid, sharp crystals can form and build up in your joints. This causes pain and swelling. Gout attacks can happen quickly and be very painful (acute gout). Over time, the attacks can affect more joints and happen more often (chronic gout). What are the causes? Gout is caused by too much uric acid in your blood. This can happen because: Your kidneys do not remove enough uric acid from your blood. Your body makes too much uric acid. You eat too many foods that are high in purines. These foods include organ meats, some seafood, and beer. Trauma or stress can bring on an attack. What increases the risk? Having a family history of gout. Being male and middle-aged. Being male and having gone through menopause. Having an organ transplant. Taking certain medicines. Having certain conditions, such as: Being very overweight (obese). Lead poisoning. Kidney disease. A skin condition called psoriasis. Other risks include: Losing weight too quickly. Not having enough water in the body (being dehydrated). Drinking alcohol, especially beer. Drinking beverages that are sweetened with a type of sugar called fructose. What are the signs or symptoms? An attack of acute gout often starts at night and usually happens in just one joint. The most common place is the big toe. Other joints that may be affected include joints of the feet, ankle, knee, fingers, wrist, or elbow. Symptoms may include: Very bad pain. Warmth. Swelling. Stiffness. Tenderness. The affected joint may be very painful to touch. Shiny, red, or purple skin. Chills and fever. Chronic gout may cause symptoms more often. More joints may be involved. You may also have white or yellow lumps  (tophi) on your hands or feet or in other areas near your joints. How is this treated? Treatment for an acute attack may include medicines for pain and swelling, such as: NSAIDs, such as ibuprofen. Steroids taken by mouth or injected into a joint. Colchicine. This can be given by mouth or through an IV tube. Treatment to prevent future attacks may include: Taking small doses of NSAIDs or colchicine daily. Using a medicine that reduces uric acid levels in your blood, such as allopurinol. Making changes to your diet. You may need to see a food expert (dietitian) about what to eat and drink to prevent gout. Follow these instructions at home: During a gout attack  If told, put ice on the painful area. To do this: Put ice in a plastic bag. Place a towel between your skin and the bag. Leave the ice on for 20 minutes, 2-3 times a day. Take off the ice if your skin turns bright red. This is very important. If you cannot feel pain, heat, or cold, you have a greater risk of damage to the area. Raise the painful joint above the level of your heart as often as you can. Rest the joint as much as possible. If the joint is in your leg, you may be given crutches. Follow instructions from your doctor about what you cannot eat or drink. Avoiding future gout attacks Eat a low-purine diet. Avoid foods and drinks such as: Liver. Kidney. Anchovies. Asparagus. Herring. Mushrooms. Mussels. Beer. Stay at a healthy weight. If you want to lose weight, talk with your doctor. Do not   lose weight too fast. Start or continue an exercise plan as told by your doctor. Eating and drinking Avoid drinks sweetened by fructose. Drink enough fluids to keep your pee (urine) pale yellow. If you drink alcohol: Limit how much you have to: 0-1 drink a day for women who are not pregnant. 0-2 drinks a day for men. Know how much alcohol is in a drink. In the [place] one drink equals one 12 oz bottle of beer (355 mL), one 5 oz  glass of wine (148 mL), or one 1 oz glass of hard liquor (44 mL). General instructions Take over-the-counter and prescription medicines only as told by your doctor. Ask your doctor if you should avoid driving or using machines while you are taking your medicine. Return to your normal activities when your doctor says that it is safe. Keep all follow-up visits. Where to find more information National Institutes of Health: www.niams.nih.gov Contact a doctor if: You have another gout attack. You still have symptoms of a gout attack after 10 days of treatment. You have problems (side effects) because of your medicines. You have chills or a fever. You have burning pain when you pee (urinate). You have pain in your lower back or belly. Get help right away if: You have very bad pain. Your pain cannot be controlled. You cannot pee. Summary Gout is painful swelling of the joints. The most common site of pain is the big toe, but it can affect other joints. Medicines and avoiding some foods can help to prevent and treat gout attacks. This information is not intended to replace advice given to you by your health care provider. Make sure you discuss any questions you have with your health care provider. Document Revised: 05/08/2021 Document Reviewed: 05/08/2021 Elsevier Patient Education  2023 Elsevier Inc.  

## 2022-02-20 ENCOUNTER — Encounter: Payer: Self-pay | Admitting: Nurse Practitioner

## 2022-02-20 ENCOUNTER — Ambulatory Visit (INDEPENDENT_AMBULATORY_CARE_PROVIDER_SITE_OTHER): Payer: Medicare Other | Admitting: Nurse Practitioner

## 2022-02-20 VITALS — BP 127/79 | HR 93 | Temp 98.0°F | Resp 20 | Ht 68.0 in | Wt 249.0 lb

## 2022-02-20 DIAGNOSIS — H9391 Unspecified disorder of right ear: Secondary | ICD-10-CM | POA: Diagnosis not present

## 2022-02-20 DIAGNOSIS — I2510 Atherosclerotic heart disease of native coronary artery without angina pectoris: Secondary | ICD-10-CM | POA: Diagnosis not present

## 2022-02-20 DIAGNOSIS — B079 Viral wart, unspecified: Secondary | ICD-10-CM | POA: Diagnosis not present

## 2022-02-20 DIAGNOSIS — K219 Gastro-esophageal reflux disease without esophagitis: Secondary | ICD-10-CM

## 2022-02-20 DIAGNOSIS — E782 Mixed hyperlipidemia: Secondary | ICD-10-CM

## 2022-02-20 DIAGNOSIS — I10 Essential (primary) hypertension: Secondary | ICD-10-CM

## 2022-02-20 DIAGNOSIS — G5702 Lesion of sciatic nerve, left lower limb: Secondary | ICD-10-CM

## 2022-02-20 DIAGNOSIS — K5790 Diverticulosis of intestine, part unspecified, without perforation or abscess without bleeding: Secondary | ICD-10-CM | POA: Diagnosis not present

## 2022-02-20 DIAGNOSIS — M109 Gout, unspecified: Secondary | ICD-10-CM

## 2022-02-20 MED ORDER — APIXABAN 5 MG PO TABS: 5.0000 mg | ORAL_TABLET | Freq: Two times a day (BID) | ORAL | 6 refills | Status: DC

## 2022-02-20 MED ORDER — OMEPRAZOLE MAGNESIUM 20 MG PO TBEC: 20.0000 mg | DELAYED_RELEASE_TABLET | Freq: Two times a day (BID) | ORAL | 2 refills | Status: DC

## 2022-02-20 MED ORDER — HYDROCODONE-ACETAMINOPHEN 10-325 MG PO TABS: 1.0000 | ORAL_TABLET | Freq: Three times a day (TID) | ORAL | 0 refills | Status: DC | PRN

## 2022-02-20 MED ORDER — ATORVASTATIN CALCIUM 40 MG PO TABS: 40.0000 mg | ORAL_TABLET | Freq: Every day | ORAL | 3 refills | Status: DC

## 2022-02-20 MED ORDER — FENOFIBRATE 145 MG PO TABS: 145.0000 mg | ORAL_TABLET | Freq: Every day | ORAL | 5 refills | Status: DC

## 2022-02-20 MED ORDER — ALLOPURINOL 100 MG PO TABS: 100.0000 mg | ORAL_TABLET | Freq: Every day | ORAL | 6 refills | Status: DC

## 2022-02-20 MED ORDER — AMLODIPINE BESY-BENAZEPRIL HCL 10-40 MG PO CAPS: 1.0000 | ORAL_CAPSULE | Freq: Every day | ORAL | 3 refills | Status: DC

## 2022-02-20 NOTE — Progress Notes (Signed)
Subjective:    Patient ID: Donald Butler, male    DOB: 08-Sep-1960, 61 y.o.   MRN: [id number]   Chief Complaint: medical management of chronic issues     HPI:  Donald Butler is a 61 y.o. who identifies as a male who was assigned male at birth.   Social history: Lives with: wife Work history: disability   Comes in today for follow up of the following chronic medical issues:  1. Primary hypertension No c/o chest pain, so or headache. Does not check blood pressure ar home. Lotrel 10/40 and lipitor was increased to 32m daily. BP Readings from Last 3 Encounters:  12/11/21 (!) 140/98  02/21/21 134/75  11/02/20 (!) 150/92     2. Coronary artery disease involving native coronary artery of native heart without angina pectoris Last saw cardiology on 12/11/21. Review of office note-   3. Mixed hyperlipidemia Does try to watch diet but does no dedicated exercise. Lab Results  Component Value Date   CHOL 232 (H) 02/21/2021   HDL 46 02/21/2021   LDLCALC 101 (H) 02/21/2021   TRIG 506 (H) 02/21/2021   CHOLHDL 5.0 02/21/2021   The 10-year ASCVD risk score (Arnett DK, et al., 2019) is: 12.9%   4. Diverticulosis No recent flare ups  5. Gastroesophageal reflux disease, unspecified whether esophagitis present Is on omeprazole daily and is doing well.  6. Gout, unspecified cause, unspecified chronicity, unspecified site Had flare up 02/17/22 and was treated with colchicine. Is much better.  7. Morbid obesity ([place] No recent weight changes Wt Readings from Last 3 Encounters:  02/20/22 249 lb (112.9 kg)  12/11/21 249 lb (112.9 kg)  02/21/21 242 lb (109.8 kg)   BMI Readings from Last 3 Encounters:  02/20/22 37.86 kg/m  12/11/21 37.86 kg/m  02/21/21 35.74 kg/m     New complaints: Takes hydrocodone on occasion. He is out. He does not even take 1x a week. He is out and would like refill  Allergies  Allergen Reactions   Penicillins Hives, Shortness Of Breath and Swelling    Outpatient Encounter Medications as of 02/20/2022  Medication Sig   albuterol (PROVENTIL HFA;VENTOLIN HFA) 108 (90 Base) MCG/ACT inhaler Inhale 2 puffs into the lungs every 6 (six) hours as needed for wheezing or shortness of breath.   allopurinol (ZYLOPRIM) 300 MG tablet Take 0.5 tablets (150 mg total) by mouth daily. (Patient taking differently: Take 150 mg by mouth daily as needed.)   amLODipine-benazepril (LOTREL) 10-40 MG capsule Take 1 capsule by mouth daily.   apixaban (ELIQUIS) 5 MG TABS tablet Take 1 tablet (5 mg total) by mouth 2 (two) times daily.   aspirin EC 81 MG tablet Take 81 mg by mouth daily.    atorvastatin (LIPITOR) 40 MG tablet Take 1 tablet (40 mg total) by mouth daily.   colchicine 0.6 MG tablet Take 1 tablet (0.6 mg total) by mouth daily as needed.   fenofibrate (TRICOR) 145 MG tablet Take 1 tablet (145 mg total) by mouth daily.   HYDROcodone-acetaminophen (NORCO) 10-325 MG tablet Take 1 tablet by mouth every 8 (eight) hours as needed. (Patient not taking: Reported on 12/11/2021)   loratadine (CLARITIN) 10 MG tablet Take 10 mg by mouth daily.    Multiple Vitamin (MULTIVITAMIN) tablet Take 1 tablet by mouth daily.    omeprazole (PRILOSEC OTC) 20 MG tablet Take 1 tablet (20 mg total) by mouth 2 (two) times daily. TAKE ON AN EMPTY STOMACH. THEN WAIT ONE HOUR TO EAT  No facility-administered encounter medications on file as of 02/20/2022.    Past Surgical History:  Procedure Laterality Date   BACK SURGERY  1982, 1983   ruptured disk  x 2   CARPAL TUNNEL RELEASE Left    COLONOSCOPY     EXTERNAL EAR SURGERY     KNEE ARTHROSCOPY  2006, 2008   x 2  left   MASTOIDECTOMY  2013   right   OPEN ANTERIOR SHOULDER RECONSTRUCTION  1970   right   POLYPECTOMY     reconstruction left leg  2009   multiple surgery after accident    Family History  Problem Relation Age of Onset   Diabetes Father    Colon cancer Neg Hx    Stomach cancer Neg Hx    Esophageal cancer Neg Hx     Rectal cancer Neg Hx    Pancreatic cancer Neg Hx       Controlled substance contract: n/a     Review of Systems  Constitutional:  Negative for diaphoresis.  Eyes:  Negative for pain.  Respiratory:  Negative for shortness of breath.   Cardiovascular:  Negative for chest pain, palpitations and leg swelling.  Gastrointestinal:  Negative for abdominal pain.  Endocrine: Negative for polydipsia.  Skin:  Negative for rash.  Neurological:  Negative for dizziness, weakness and headaches.  Hematological:  Does not bruise/bleed easily.  All other systems reviewed and are negative.      Objective:   Physical Exam Vitals and nursing note reviewed.  Constitutional:      Appearance: Normal appearance. He is well-developed.  HENT:     Head: Normocephalic.     Nose: Nose normal.     Mouth/Throat:     Mouth: Mucous membranes are moist.     Pharynx: Oropharynx is clear.  Eyes:     Pupils: Pupils are equal, round, and reactive to light.  Neck:     Thyroid: No thyroid mass or thyromegaly.     Vascular: No carotid bruit or JVD.     Trachea: Phonation normal.  Cardiovascular:     Rate and Rhythm: Normal rate and regular rhythm.  Pulmonary:     Effort: Pulmonary effort is normal. No respiratory distress.     Breath sounds: Normal breath sounds.  Abdominal:     General: Bowel sounds are normal.     Palpations: Abdomen is soft.     Tenderness: There is no abdominal tenderness.  Musculoskeletal:        General: Normal range of motion.     Cervical back: Normal range of motion and neck supple.     Right lower leg: Edema (1+) present.     Left lower leg: Edema (1+) present.  Lymphadenopathy:     Cervical: No cervical adenopathy.  Skin:    General: Skin is warm and dry.  Neurological:     Mental Status: He is alert and oriented to person, place, and time.  Psychiatric:        Behavior: Behavior normal.        Thought Content: Thought content normal.        Judgment: Judgment  normal.     BP 127/79   Pulse 93   Temp 98 F (36.7 C) (Temporal)   Resp 20   Ht 5' 8"  (1.727 m)   Wt 249 lb (112.9 kg)   SpO2 95%   BMI 37.86 kg/m   Warty lesion right auricle- cryotherapy done     Assessment & Plan:  Donald Butler in today with chief complaint of Medical Management of Chronic Issues   1. Primary hypertension Low sodium diet - CBC with Differential/Platelet - CMP14+EGFR - amLODipine-benazepril (LOTREL) 10-40 MG capsule; Take 1 capsule by mouth daily.  Dispense: 90 capsule; Refill: 3  2. Coronary artery disease involving native coronary artery of native heart without angina pectoris Keep follow up with cardiology - apixaban (ELIQUIS) 5 MG TABS tablet; Take 1 tablet (5 mg total) by mouth 2 (two) times daily.  Dispense: 60 tablet; Refill: 6  3. Mixed hyperlipidemia Low fat diet - Lipid panel - fenofibrate (TRICOR) 145 MG tablet; Take 1 tablet (145 mg total) by mouth daily.  Dispense: 30 tablet; Refill: 5 - atorvastatin (LIPITOR) 40 MG tablet; Take 1 tablet (40 mg total) by mouth daily.  Dispense: 90 tablet; Refill: 3  4. Diverticulosis Watch diet to prevent flare up  5. Gastroesophageal reflux disease, unspecified whether esophagitis present Avoid spicy foods Do not eat 2 hours prior to bedtime  - omeprazole (PRILOSEC OTC) 20 MG tablet; Take 1 tablet (20 mg total) by mouth 2 (two) times daily. TAKE ON AN EMPTY STOMACH. THEN WAIT ONE HOUR TO EAT  Dispense: 60 tablet; Refill: 2  6. Gout, unspecified cause, unspecified chronicity, unspecified site  - allopurinol (ZYLOPRIM) 100 MG tablet; Take 1 tablet (100 mg total) by mouth daily.  Dispense: 30 tablet; Refill: 6  7. Morbid obesity ([place]) Discussed diet and exercise for person with BMI >25 Will recheck weight in 3-6 months   8. Compression of common peroneal nerve, left - HYDROcodone-acetaminophen (NORCO) 10-325 MG tablet; Take 1 tablet by mouth every 8 (eight) hours as needed.  Dispense: 30  tablet; Refill: 0  9. Wart lesion right auricle cryotherapy  The above assessment and management plan was discussed with the patient. The patient verbalized understanding of and has agreed to the management plan. Patient is aware to call the clinic if symptoms persist or worsen. Patient is aware when to return to the clinic for a follow-up visit. Patient educated on when it is appropriate to go to the emergency department.   [name] Done, FNP

## 2022-02-20 NOTE — Patient Instructions (Signed)
Cryosurgery for Skin Conditions Cryosurgery is the use of a very cold liquid (liquid nitrogen) to treat abnormal or diseased tissue. This treatment is also called cryotherapy. It can freeze or take away growths on skin, such as: Warts. Skin sores that could become cancer. Some skin cancers. This treatment normally takes a few minutes. It can be done in your doctor's office. Tell a doctor about: Any allergies you have. All medicines you are taking, including vitamins, herbs, eye drops, creams, and over-the-counter medicines. Any problems you or family members have had with medicines that make you fall asleep (anesthetic medicines). Any blood disorders you have. Any surgeries you have had. Any medical conditions you have. Whether you are pregnant or may be pregnant. What are the risks? Generally, this is a safe treatment. However, problems may occur, including: Infection. Bleeding. Scarring. Changes in skin color (lighter or darker than normal skin tone). Swelling. Hair loss in the treated area. Damage to nearby parts or organs, such as nerve damage and loss of feeling. This is rare. What happens before the procedure? You do not have to do anything to get ready for this treatment. Your doctor will talk with you about: The treatment. The benefits and risks. What happens during the procedure?  Your treatment will be done in one of these two ways: Your doctor may use a tool (probe) on your skin. The tool has very cold liquid in it to cool it down. The tool will be used until the skin is frozen and killed. Your doctor may use a swab or spray to get the very cold liquid onto your skin. Your doctor will keep using the very cold liquid until the skin is frozen and killed. A bandage (dressing) may be put on the area. These procedures may vary among doctors and clinics. What can I expect after the treatment? After your treatment, it is common to have: Redness over the treated area. Swelling  over the treated area. A blister that forms over the treated area. The blister may have a little blood in it. You may also have some mild stinging or a burning feeling that will go away. If a blister forms, it will break open on its own after about 2-4 weeks. This will leave a scab. Then the treated area will heal. After healing, there is normally little or no scarring. Follow these instructions at home: Caring for the treated area  Follow instructions from your doctor about how to take care of your treated area. If you have a bandage, make sure you: Wash your hands with soap and water for at least 20 seconds before and after you change your bandage. If you cannot use soap and water, use hand sanitizer. Change your bandage as told by your doctor. Keep the bandage and the treated area clean and dry. If the bandage gets wet, change it right away. Clean the treated area with soap and water. Keep the area covered with a bandage until it heals, or for as long as told by your doctor. Check the treated area every day for signs of infection. Check for: More redness, swelling, or pain. More fluid or blood. Warmth. Pus or a bad smell. If you have a blister: Do not pick at it. Doing this can cause infection and scarring. Do not try to break it open. Doing this can cause infection and scarring. Do not put any medicine, cream, or lotion on the treated area unless told by your doctor. Bathing Until your doctor approves: Do  not take baths. Do not swim. Do not use a hot tub. Do not hand-wash dishes. Do not soak the treated area in other ways. Ask your doctor if you may take showers. You may only be allowed to take sponge baths. General instructions Take over-the-counter and prescription medicines only as told by your doctor. Do not use any products that contain nicotine or tobacco, such as cigarettes, e-cigarettes, and chewing tobacco. These can delay healing. If you need help quitting, ask your  doctor. Keep all follow-up visits as told by your doctor. This is important. Contact a doctor if: You have any of these signs of infection in or around your treated area: More redness. More swelling. More pain. More fluid. More blood. Warmth. Pus or a bad smell. Your blister grows large and causes pain. Get help right away if: You have a fever. You have redness that spreads from the treated area. Summary Cryosurgery uses a very cold liquid to freeze or take away growths on skin. It is also called cryotherapy. Generally, this is a safe treatment. You do not have to do anything to get ready for it. Your doctor will freeze or kill the skin in one of two ways. One way is with a tool (probe) that has the very cold liquid in it. The other way is to spread the very cold liquid onto your skin with a swab or spray. After your treatment, follow care instructions from your doctor. Watch for infection. If you have a blister, do not pick at it or break it open. This information is not intended to replace advice given to you by your health care provider. Make sure you discuss any questions you have with your health care provider. Document Revised: 03/23/2019 Document Reviewed: 03/23/2019 Elsevier Patient Education  [place].

## 2022-02-21 LAB — CMP14+EGFR
ALT: 63 IU/L — ABNORMAL HIGH (ref 0–44)
AST: 58 IU/L — ABNORMAL HIGH (ref 0–40)
Albumin/Globulin Ratio: 2 (ref 1.2–2.2)
Albumin: 4.6 g/dL (ref 3.8–4.8)
Alkaline Phosphatase: 96 IU/L (ref 44–121)
BUN/Creatinine Ratio: 20 (ref 10–24)
BUN: 20 mg/dL (ref 8–27)
Bilirubin Total: 0.4 mg/dL (ref 0.0–1.2)
CO2: 20 mmol/L (ref 20–29)
Calcium: 9.5 mg/dL (ref 8.6–10.2)
Chloride: 101 mmol/L (ref 96–106)
Creatinine, Ser: 0.98 mg/dL (ref 0.76–1.27)
Globulin, Total: 2.3 g/dL (ref 1.5–4.5)
Glucose: 155 mg/dL — ABNORMAL HIGH (ref 70–99)
Potassium: 4.4 mmol/L (ref 3.5–5.2)
Sodium: 140 mmol/L (ref 134–144)
Total Protein: 6.9 g/dL (ref 6.0–8.5)
eGFR: 88 mL/min/{1.73_m2} (ref >59)

## 2022-02-21 LAB — CBC WITH DIFFERENTIAL/PLATELET
Basophils Absolute: 0.1 10*3/uL (ref 0.0–0.2)
Basos: 1 %
EOS (ABSOLUTE): 0.3 10*3/uL (ref 0.0–0.4)
Eos: 4 %
Hematocrit: 43 % (ref 37.5–51.0)
Hemoglobin: 15.3 g/dL (ref 13.0–17.7)
Immature Grans (Abs): 0 10*3/uL (ref 0.0–0.1)
Immature Granulocytes: 0 %
Lymphocytes Absolute: 1.9 10*3/uL (ref 0.7–3.1)
Lymphs: 25 %
MCH: 33.3 pg — ABNORMAL HIGH (ref 26.6–33.0)
MCHC: 35.6 g/dL (ref 31.5–35.7)
MCV: 94 fL (ref 79–97)
Monocytes Absolute: 0.8 10*3/uL (ref 0.1–0.9)
Monocytes: 11 %
Neutrophils Absolute: 4.3 10*3/uL (ref 1.4–7.0)
Neutrophils: 59 %
Platelets: 252 10*3/uL (ref 150–450)
RBC: 4.59 x10E6/uL (ref 4.14–5.80)
RDW: 11.8 % (ref 11.6–15.4)
WBC: 7.4 10*3/uL (ref 3.4–10.8)

## 2022-02-21 LAB — LIPID PANEL
Chol/HDL Ratio: 3.2 ratio (ref 0.0–5.0)
Cholesterol, Total: 164 mg/dL (ref 100–199)
HDL: 51 mg/dL (ref >39)
LDL Chol Calc (NIH): 68 mg/dL (ref 0–99)
Triglycerides: 286 mg/dL — ABNORMAL HIGH (ref 0–149)
VLDL Cholesterol Cal: 45 mg/dL — ABNORMAL HIGH (ref 5–40)

## 2022-02-24 DIAGNOSIS — R002 Palpitations: Secondary | ICD-10-CM | POA: Insufficient documentation

## 2022-02-24 NOTE — Progress Notes (Unsigned)
Cardiology Office Note   Date:  02/26/2022   ID:  Facundo, Allemand 1960-11-13, MRN [id number]  PCP:  Chevis Pretty, FNP  Cardiologist:   Minus Breeding, MD   Chief Complaint  Patient presents with   Atrial Fibrillation     History of Present Illness:. Donald Butler is a 61 y.o. male who presents for dizziness.    Coronary CT angiography showed a coronary artery calcium score of 856 Agatston units placing him in the 97th percentile for age and gender, suggesting a high risk for future cardiac events.  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 Echocardiogram on 06/17/2018 showed normal left ventricular systolic function and regional wall motion, LVEF 60 to 65%, and mild mitral regurgitation.     Since I last saw him he wore a monitor.  I reviewed this with him.  He had very brief episodes of atrial fibrillation.  This was less than 1%.  He has his own wearable and is actually taken off because of agitated him when he would see some rapid heart rates.  He did pick up some atrial fibrillation on that.  However, none of his symptoms last particularly long.  He cannot bring them on.  He denies any chest pressure, neck or arm discomfort.  He has had no new presyncope or syncope.  He has no PND or orthopnea.  He said no weight gain or edema.  He is limited by joint pains.   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    Past Surgical History:  Procedure Laterality Date   BACK SURGERY  1982, 1983   ruptured disk  x 2   CARPAL TUNNEL RELEASE Left    COLONOSCOPY     EXTERNAL EAR SURGERY     KNEE ARTHROSCOPY  2006, 2008   x 2  left    MASTOIDECTOMY  2013   right   OPEN ANTERIOR SHOULDER RECONSTRUCTION  1970   right   POLYPECTOMY     reconstruction left leg  2009   multiple surgery after accident     Current Outpatient Medications  Medication Sig Dispense Refill   albuterol (PROVENTIL HFA;VENTOLIN HFA) 108 (90 Base) MCG/ACT inhaler Inhale 2 puffs into the lungs every 6 (six) hours as needed for wheezing or shortness of breath. 1 Inhaler 2   allopurinol (ZYLOPRIM) 100 MG tablet Take 1 tablet (100 mg total) by mouth daily. 30 tablet 6   amLODipine-benazepril (LOTREL) 10-40 MG capsule Take 1 capsule by mouth daily. 90 capsule 3   aspirin EC 81 MG tablet Take 81 mg by mouth daily.      atorvastatin (LIPITOR) 40 MG tablet Take 1 tablet (40 mg total) by mouth daily. 90 tablet 3   colchicine 0.6 MG tablet Take 1 tablet (0.6 mg total) by mouth daily as needed. 30 tablet 1   fenofibrate (TRICOR) 145 MG tablet Take 1 tablet (145 mg total) by mouth daily. 30 tablet 5   HYDROcodone-acetaminophen (NORCO) 10-325 MG tablet Take 1 tablet by mouth every 8 (eight) hours as needed. 30 tablet 0  loratadine (CLARITIN) 10 MG tablet Take 10 mg by mouth daily.      Multiple Vitamin (MULTIVITAMIN) tablet Take 1 tablet by mouth daily.      omeprazole (PRILOSEC OTC) 20 MG tablet Take 1 tablet (20 mg total) by mouth 2 (two) times daily. TAKE ON AN EMPTY STOMACH. THEN WAIT ONE HOUR TO EAT 60 tablet 2   No current facility-administered medications for this visit.    Allergies:   Penicillins   ROS:  Please see the history of present illness.   Otherwise, review of systems are positive none.   All other systems are reviewed and negative.    PHYSICAL EXAM: VS:  BP (!) 144/82   Pulse 76   Ht '5\' 8"'$  (1.727 m)   Wt 245 lb (111.1 kg)   BMI 37.25 kg/m  , BMI Body mass index is 37.25 kg/m. GENERAL:  Well appearing NECK:  No jugular venous distention, waveform within normal limits, carotid upstroke brisk and symmetric, no bruits, no  thyromegaly LUNGS:  Clear to auscultation bilaterally CHEST:  Unremarkable HEART:  PMI not displaced or sustained,S1 and S2 within normal limits, no S3, no S4, no clicks, no rubs, no murmurs ABD:  Flat, positive bowel sounds normal in frequency in pitch, no bruits, no rebound, no guarding, no midline pulsatile mass, no hepatomegaly, no splenomegaly EXT:  2 plus pulses throughout, no edema, no cyanosis no clubbing   EKG:  EKG is not ordered today.    Recent Labs: 02/20/2022: ALT 63; BUN 20; Creatinine, Ser 0.98; Hemoglobin 15.3; Platelets 252; Potassium 4.4; Sodium 140    Lipid Panel    Component Value Date/Time   CHOL 164 02/20/2022 1210   TRIG 286 (H) 02/20/2022 1210   HDL 51 02/20/2022 1210   CHOLHDL 3.2 02/20/2022 1210   LDLCALC 68 02/20/2022 1210      Wt Readings from Last 3 Encounters:  02/26/22 245 lb (111.1 kg)  02/20/22 249 lb (112.9 kg)  12/11/21 249 lb (112.9 kg)      Other studies Reviewed: Additional studies/ records that were reviewed today include: Monitor . Review of the above records demonstrates:  Please see elsewhere in the note.     ASSESSMENT AND PLAN:  ELEVATED CORONARY CALCIUM : He has had mild nonobstructive coronary disease.  We talked at great length previously as his [place] score is elevated at 30%.  He is pursuing primary risk reduction.  Hypertension:    His blood pressure is at target.  No change in therapy.   Mixed dyslipidemia:   LDL was 68 with an HDL of 51.  No change in therapy. Triglycerides are elevated.  He can continue on the fish oil.  We talked about diet to bring this down.  Atrial fibrillation: He has very short paroxysms of fibrillation.  At this point there is no clear indication for DOAC and he will discontinue Eliquis.  We had a long conversation about this.  Of note I did bring up the fact that he might have sleep apnea with his body habitus and snoring but he said he would not wear the mask and would not want  testing.  Current medicines are reviewed at length with the patient today.  The patient does not have concerns regarding medicines.  The following changes have been made: As above  Labs/ tests ordered today include: None  No orders of the defined types were placed in this encounter.    Disposition:   FU with me in 6 months  Signed, Minus Breeding, MD  02/26/2022 12:12 PM    [place]

## 2022-02-26 ENCOUNTER — Ambulatory Visit: Payer: Medicare Other | Admitting: Cardiology

## 2022-02-26 ENCOUNTER — Encounter: Payer: Self-pay | Admitting: Cardiology

## 2022-02-26 VITALS — BP 144/82 | HR 76 | Ht 68.0 in | Wt 245.0 lb

## 2022-02-26 DIAGNOSIS — I10 Essential (primary) hypertension: Secondary | ICD-10-CM | POA: Diagnosis not present

## 2022-02-26 DIAGNOSIS — R931 Abnormal findings on diagnostic imaging of heart and coronary circulation: Secondary | ICD-10-CM | POA: Diagnosis not present

## 2022-02-26 DIAGNOSIS — R002 Palpitations: Secondary | ICD-10-CM

## 2022-02-26 DIAGNOSIS — E785 Hyperlipidemia, unspecified: Secondary | ICD-10-CM

## 2022-02-26 NOTE — Patient Instructions (Signed)
Medication Instructions:  Please discontinue your Eliquis. Continue all other medications as listed.  *If you need a refill on your cardiac medications before your next appointment, please call your pharmacy*   Follow-Up: At [facility], you and your health needs are our priority.  As part of our continuing mission to provide you with exceptional heart care, we have created designated Provider Care Teams.  These Care Teams include your primary Cardiologist (physician) and Advanced Practice Providers (APPs -  Physician Assistants and Nurse Practitioners) who all work together to provide you with the care you need, when you need it.  We recommend signing up for the patient portal called "MyChart".  Sign up information is provided on this After Visit Summary.  MyChart is used to connect with patients for Virtual Visits (Telemedicine).  Patients are able to view lab/test results, encounter notes, upcoming appointments, etc.  Non-urgent messages can be sent to your provider as well.   To learn more about what you can do with MyChart, go to [email or web address].    Your next appointment:   6 month(s)  The format for your next appointment:   In Person  Provider:   Minus Breeding, MD{    Important Information About Sugar

## 2022-03-07 ENCOUNTER — Ambulatory Visit (INDEPENDENT_AMBULATORY_CARE_PROVIDER_SITE_OTHER): Payer: Medicare Other | Admitting: Nurse Practitioner

## 2022-03-07 ENCOUNTER — Encounter: Payer: Self-pay | Admitting: Nurse Practitioner

## 2022-03-07 VITALS — BP 154/99 | HR 81 | Temp 98.3°F | Resp 20 | Ht 68.0 in | Wt 248.0 lb

## 2022-03-07 DIAGNOSIS — M109 Gout, unspecified: Secondary | ICD-10-CM | POA: Diagnosis not present

## 2022-03-07 MED ORDER — DOXYCYCLINE HYCLATE 100 MG PO TABS: 100.0000 mg | ORAL_TABLET | Freq: Two times a day (BID) | ORAL | 0 refills | Status: DC

## 2022-03-07 MED ORDER — PREDNISONE 20 MG PO TABS: ORAL_TABLET | ORAL | 0 refills | Status: DC

## 2022-03-07 MED ORDER — METHYLPREDNISOLONE ACETATE 80 MG/ML IJ SUSP
80.0000 mg | Freq: Once | INTRAMUSCULAR | Status: AC
  Administered 2022-03-07: 80 mg via INTRAMUSCULAR

## 2022-03-07 NOTE — Progress Notes (Signed)
   Subjective:    Patient ID: Donald Butler, male    DOB: 1960/12/03, 61 y.o.   MRN: [id number]   Chief Complaint: Elbow Pain (Right)   HPI Patient had gout flare up  that stated lst week. He thought at firstit was bursitis. Then he  thought was his gout. He started colchicine 5 days ago but elbow is still very painful. Rates 10/10     Review of Systems  Constitutional:  Negative for diaphoresis.  Eyes:  Negative for pain.  Respiratory:  Negative for shortness of breath.   Cardiovascular:  Negative for chest pain, palpitations and leg swelling.  Gastrointestinal:  Negative for abdominal pain.  Endocrine: Negative for polydipsia.  Skin:  Negative for rash.  Neurological:  Negative for dizziness, weakness and headaches.  Hematological:  Does not bruise/bleed easily.  All other systems reviewed and are negative.      Objective:   Physical Exam Vitals and nursing note reviewed.  Constitutional:      Appearance: Normal appearance. He is well-developed.  Neck:     Thyroid: No thyroid mass or thyromegaly.     Vascular: No carotid bruit or JVD.     Trachea: Phonation normal.  Cardiovascular:     Rate and Rhythm: Normal rate and regular rhythm.  Pulmonary:     Effort: Pulmonary effort is normal. No respiratory distress.     Breath sounds: Normal breath sounds.  Abdominal:     General: Bowel sounds are normal.     Palpations: Abdomen is soft.     Tenderness: There is no abdominal tenderness.  Musculoskeletal:        General: Normal range of motion.     Cervical back: Normal range of motion and neck supple.     Comments: right elbow erythematous and warm to touch with edema.  Lymphadenopathy:     Cervical: No cervical adenopathy.  Skin:    General: Skin is warm and dry.  Neurological:     Mental Status: He is alert and oriented to person, place, and time.  Psychiatric:        Behavior: Behavior normal.        Thought Content: Thought content normal.        Judgment:  Judgment normal.    BP (!) 154/99   Pulse 81   Temp 98.3 F (36.8 C) (Temporal)   Resp 20   Ht _0  (1.727 m)   Wt 248 lb (112.5 kg)   SpO2 95%   BMI 37.71 kg/m         Assessment & Plan:   Donald Butler in today with chief complaint of Elbow Pain (Right)   1. Gouty bursitis of right olecranon Ice Rest RTO Monday if no better. - doxycycline (VIBRA-TABS) 100 MG tablet; Take 1 tablet (100 mg total) by mouth 2 (two) times daily.  Dispense: 20 tablet; Refill: 0 - predniSONE (DELTASONE) 20 MG tablet; 2 po at sametime daily for 5 days-  Dispense: 10 tablet; Refill: 0 - methylPREDNISolone acetate (DEPO-MEDROL) injection 80 mg    The above assessment and management plan was discussed with the patient. The patient verbalized understanding of and has agreed to the management plan. Patient is aware to call the clinic if symptoms persist or worsen. Patient is aware when to return to the clinic for a follow-up visit. Patient educated on when it is appropriate to go to the emergency department.   [name] Done, FNP

## 2022-03-07 NOTE — Patient Instructions (Signed)
Gout  Gout is painful swelling of your joints. Gout is a type of arthritis. It is caused by having too much uric acid in your body. Uric acid is a chemical that is made when your body breaks down substances called purines. If your body has too much uric acid, sharp crystals can form and build up in your joints. This causes pain and swelling. Gout attacks can happen quickly and be very painful (acute gout). Over time, the attacks can affect more joints and happen more often (chronic gout). What are the causes? Gout is caused by too much uric acid in your blood. This can happen because: Your kidneys do not remove enough uric acid from your blood. Your body makes too much uric acid. You eat too many foods that are high in purines. These foods include organ meats, some seafood, and beer. Trauma or stress can bring on an attack. What increases the risk? Having a family history of gout. Being male and middle-aged. Being male and having gone through menopause. Having an organ transplant. Taking certain medicines. Having certain conditions, such as: Being very overweight (obese). Lead poisoning. Kidney disease. A skin condition called psoriasis. Other risks include: Losing weight too quickly. Not having enough water in the body (being dehydrated). Drinking alcohol, especially beer. Drinking beverages that are sweetened with a type of sugar called fructose. What are the signs or symptoms? An attack of acute gout often starts at night and usually happens in just one joint. The most common place is the big toe. Other joints that may be affected include joints of the feet, ankle, knee, fingers, wrist, or elbow. Symptoms may include: Very bad pain. Warmth. Swelling. Stiffness. Tenderness. The affected joint may be very painful to touch. Shiny, red, or purple skin. Chills and fever. Chronic gout may cause symptoms more often. More joints may be involved. You may also have white or yellow lumps  (tophi) on your hands or feet or in other areas near your joints. How is this treated? Treatment for an acute attack may include medicines for pain and swelling, such as: NSAIDs, such as ibuprofen. Steroids taken by mouth or injected into a joint. Colchicine. This can be given by mouth or through an IV tube. Treatment to prevent future attacks may include: Taking small doses of NSAIDs or colchicine daily. Using a medicine that reduces uric acid levels in your blood, such as allopurinol. Making changes to your diet. You may need to see a food expert (dietitian) about what to eat and drink to prevent gout. Follow these instructions at home: During a gout attack  If told, put ice on the painful area. To do this: Put ice in a plastic bag. Place a towel between your skin and the bag. Leave the ice on for 20 minutes, 2-3 times a day. Take off the ice if your skin turns bright red. This is very important. If you cannot feel pain, heat, or cold, you have a greater risk of damage to the area. Raise the painful joint above the level of your heart as often as you can. Rest the joint as much as possible. If the joint is in your leg, you may be given crutches. Follow instructions from your doctor about what you cannot eat or drink. Avoiding future gout attacks Eat a low-purine diet. Avoid foods and drinks such as: Liver. Kidney. Anchovies. Asparagus. Herring. Mushrooms. Mussels. Beer. Stay at a healthy weight. If you want to lose weight, talk with your doctor. Do not   lose weight too fast. Start or continue an exercise plan as told by your doctor. Eating and drinking Avoid drinks sweetened by fructose. Drink enough fluids to keep your pee (urine) pale yellow. If you drink alcohol: Limit how much you have to: 0-1 drink a day for women who are not pregnant. 0-2 drinks a day for men. Know how much alcohol is in a drink. In the [place] one drink equals one 12 oz bottle of beer (355 mL), one 5 oz  glass of wine (148 mL), or one 1 oz glass of hard liquor (44 mL). General instructions Take over-the-counter and prescription medicines only as told by your doctor. Ask your doctor if you should avoid driving or using machines while you are taking your medicine. Return to your normal activities when your doctor says that it is safe. Keep all follow-up visits. Where to find more information National Institutes of Health: www.niams.nih.gov Contact a doctor if: You have another gout attack. You still have symptoms of a gout attack after 10 days of treatment. You have problems (side effects) because of your medicines. You have chills or a fever. You have burning pain when you pee (urinate). You have pain in your lower back or belly. Get help right away if: You have very bad pain. Your pain cannot be controlled. You cannot pee. Summary Gout is painful swelling of the joints. The most common site of pain is the big toe, but it can affect other joints. Medicines and avoiding some foods can help to prevent and treat gout attacks. This information is not intended to replace advice given to you by your health care provider. Make sure you discuss any questions you have with your health care provider. Document Revised: 05/08/2021 Document Reviewed: 05/08/2021 Elsevier Patient Education  2023 Elsevier Inc.  

## 2022-03-21 ENCOUNTER — Encounter: Payer: Self-pay | Admitting: Nurse Practitioner

## 2022-03-21 ENCOUNTER — Other Ambulatory Visit: Payer: Self-pay

## 2022-03-21 ENCOUNTER — Ambulatory Visit (INDEPENDENT_AMBULATORY_CARE_PROVIDER_SITE_OTHER): Payer: Medicare Other | Admitting: Nurse Practitioner

## 2022-03-21 VITALS — BP 137/93 | HR 98 | Temp 98.0°F | Resp 20 | Ht 68.0 in

## 2022-03-21 DIAGNOSIS — M10071 Idiopathic gout, right ankle and foot: Secondary | ICD-10-CM

## 2022-03-21 DIAGNOSIS — M7989 Other specified soft tissue disorders: Secondary | ICD-10-CM | POA: Diagnosis not present

## 2022-03-21 MED ORDER — KETOROLAC TROMETHAMINE 60 MG/2ML IM SOLN
60.0000 mg | Freq: Once | INTRAMUSCULAR | Status: AC
  Administered 2022-03-21: 60 mg via INTRAMUSCULAR

## 2022-03-21 MED ORDER — METHYLPREDNISOLONE ACETATE 80 MG/ML IJ SUSP
80.0000 mg | Freq: Once | INTRAMUSCULAR | Status: AC
  Administered 2022-03-21: 80 mg via INTRAMUSCULAR

## 2022-03-21 MED ORDER — PREDNISONE 20 MG PO TABS: ORAL_TABLET | ORAL | 0 refills | Status: DC

## 2022-03-21 NOTE — Patient Instructions (Signed)
Gout  Gout is painful swelling of your joints. Gout is a type of arthritis. It is caused by having too much uric acid in your body. Uric acid is a chemical that is made when your body breaks down substances called purines. If your body has too much uric acid, sharp crystals can form and build up in your joints. This causes pain and swelling. Gout attacks can happen quickly and be very painful (acute gout). Over time, the attacks can affect more joints and happen more often (chronic gout). What are the causes? Gout is caused by too much uric acid in your blood. This can happen because: Your kidneys do not remove enough uric acid from your blood. Your body makes too much uric acid. You eat too many foods that are high in purines. These foods include organ meats, some seafood, and beer. Trauma or stress can bring on an attack. What increases the risk? Having a family history of gout. Being male and middle-aged. Being male and having gone through menopause. Having an organ transplant. Taking certain medicines. Having certain conditions, such as: Being very overweight (obese). Lead poisoning. Kidney disease. A skin condition called psoriasis. Other risks include: Losing weight too quickly. Not having enough water in the body (being dehydrated). Drinking alcohol, especially beer. Drinking beverages that are sweetened with a type of sugar called fructose. What are the signs or symptoms? An attack of acute gout often starts at night and usually happens in just one joint. The most common place is the big toe. Other joints that may be affected include joints of the feet, ankle, knee, fingers, wrist, or elbow. Symptoms may include: Very bad pain. Warmth. Swelling. Stiffness. Tenderness. The affected joint may be very painful to touch. Shiny, red, or purple skin. Chills and fever. Chronic gout may cause symptoms more often. More joints may be involved. You may also have white or yellow lumps  (tophi) on your hands or feet or in other areas near your joints. How is this treated? Treatment for an acute attack may include medicines for pain and swelling, such as: NSAIDs, such as ibuprofen. Steroids taken by mouth or injected into a joint. Colchicine. This can be given by mouth or through an IV tube. Treatment to prevent future attacks may include: Taking small doses of NSAIDs or colchicine daily. Using a medicine that reduces uric acid levels in your blood, such as allopurinol. Making changes to your diet. You may need to see a food expert (dietitian) about what to eat and drink to prevent gout. Follow these instructions at home: During a gout attack  If told, put ice on the painful area. To do this: Put ice in a plastic bag. Place a towel between your skin and the bag. Leave the ice on for 20 minutes, 2-3 times a day. Take off the ice if your skin turns bright red. This is very important. If you cannot feel pain, heat, or cold, you have a greater risk of damage to the area. Raise the painful joint above the level of your heart as often as you can. Rest the joint as much as possible. If the joint is in your leg, you may be given crutches. Follow instructions from your doctor about what you cannot eat or drink. Avoiding future gout attacks Eat a low-purine diet. Avoid foods and drinks such as: Liver. Kidney. Anchovies. Asparagus. Herring. Mushrooms. Mussels. Beer. Stay at a healthy weight. If you want to lose weight, talk with your doctor. Do not   lose weight too fast. Start or continue an exercise plan as told by your doctor. Eating and drinking Avoid drinks sweetened by fructose. Drink enough fluids to keep your pee (urine) pale yellow. If you drink alcohol: Limit how much you have to: 0-1 drink a day for women who are not pregnant. 0-2 drinks a day for men. Know how much alcohol is in a drink. In the [place] one drink equals one 12 oz bottle of beer (355 mL), one 5 oz  glass of wine (148 mL), or one 1 oz glass of hard liquor (44 mL). General instructions Take over-the-counter and prescription medicines only as told by your doctor. Ask your doctor if you should avoid driving or using machines while you are taking your medicine. Return to your normal activities when your doctor says that it is safe. Keep all follow-up visits. Where to find more information National Institutes of Health: www.niams.nih.gov Contact a doctor if: You have another gout attack. You still have symptoms of a gout attack after 10 days of treatment. You have problems (side effects) because of your medicines. You have chills or a fever. You have burning pain when you pee (urinate). You have pain in your lower back or belly. Get help right away if: You have very bad pain. Your pain cannot be controlled. You cannot pee. Summary Gout is painful swelling of the joints. The most common site of pain is the big toe, but it can affect other joints. Medicines and avoiding some foods can help to prevent and treat gout attacks. This information is not intended to replace advice given to you by your health care provider. Make sure you discuss any questions you have with your health care provider. Document Revised: 05/08/2021 Document Reviewed: 05/08/2021 Elsevier Patient Education  2023 Elsevier Inc.  

## 2022-03-21 NOTE — Progress Notes (Signed)
   Subjective:    Patient ID: Donald Butler, male    DOB: [birth year], 61 y.o.   MRN: [id number]   Chief Complaint: ankle and foot pain (Right foot)   HPI Patient come sin c/o right foot swollen and hurting. His left foot did it last week. He took ibuprofen which helped left foot. Now right foot is the same. Motrin is not helping. He has tried colchicine and that has not helped either. Pain feels similar to his past gout pain.    Review of Systems  Constitutional:  Negative for diaphoresis.  Eyes:  Negative for pain.  Respiratory:  Negative for shortness of breath.   Cardiovascular:  Negative for chest pain, palpitations and leg swelling.  Gastrointestinal:  Negative for abdominal pain.  Endocrine: Negative for polydipsia.  Skin:  Negative for rash.  Neurological:  Negative for dizziness, weakness and headaches.  Hematological:  Does not bruise/bleed easily.  All other systems reviewed and are negative.      Objective:   Physical Exam Vitals reviewed.  Constitutional:      Appearance: Normal appearance. He is obese.  Cardiovascular:     Rate and Rhythm: Normal rate and regular rhythm.     Heart sounds: Normal heart sounds.  Pulmonary:     Effort: Pulmonary effort is normal.     Breath sounds: Normal breath sounds.  Musculoskeletal:     Comments: Right foot and ankle edematous and extremely tender to touch.   Skin:    General: Skin is warm.  Neurological:     General: No focal deficit present.     Mental Status: He is alert and oriented to person, place, and time.    BP (!) 137/93   Pulse 98   Temp 98 F (36.7 C) (Temporal)   Resp 20   Ht '5\' 8"'$  (5.456 m)   SpO2 94%   BMI 37.71 kg/m         Assessment & Plan:   Donald Butler in today with chief complaint of ankle and foot pain (Right foot)   1. Acute idiopathic gout of right foot Rest' ice RTO prn  Meds ordered this encounter  Medications   ketorolac (TORADOL) injection 60 mg   methylPREDNISolone  acetate (DEPO-MEDROL) injection 80 mg   predniSONE (DELTASONE) 20 MG tablet    Sig: 2 po at sametime daily for 5 days-    Dispense:  10 tablet    Refill:  0    Order Specific Question:   Supervising Provider    Answer:   Caryl Pina A [[id number]]      The above assessment and management plan was discussed with the patient. The patient verbalized understanding of and has agreed to the management plan. Patient is aware to call the clinic if symptoms persist or worsen. Patient is aware when to return to the clinic for a follow-up visit. Patient educated on when it is appropriate to go to the emergency department.   [name] Done, FNP

## 2022-03-22 LAB — ARTHRITIS PANEL
Basophils Absolute: 0.1 10*3/uL (ref 0.0–0.2)
Basos: 1 %
EOS (ABSOLUTE): 0.3 10*3/uL (ref 0.0–0.4)
Eos: 2 %
Hematocrit: 46.5 % (ref 37.5–51.0)
Hemoglobin: 15.7 g/dL (ref 13.0–17.7)
Immature Grans (Abs): 0 10*3/uL (ref 0.0–0.1)
Immature Granulocytes: 0 %
Lymphocytes Absolute: 1.7 10*3/uL (ref 0.7–3.1)
Lymphs: 17 %
MCH: 32 pg (ref 26.6–33.0)
MCHC: 33.8 g/dL (ref 31.5–35.7)
MCV: 95 fL (ref 79–97)
Monocytes Absolute: 1.2 10*3/uL — ABNORMAL HIGH (ref 0.1–0.9)
Monocytes: 12 %
Neutrophils Absolute: 7 10*3/uL (ref 1.4–7.0)
Neutrophils: 68 %
Platelets: 249 10*3/uL (ref 150–450)
RBC: 4.91 x10E6/uL (ref 4.14–5.80)
RDW: 11.6 % (ref 11.6–15.4)
Rheumatoid fact SerPl-aCnc: <10 IU/mL (ref <14.0)
Sed Rate: 19 mm/hr (ref 0–30)
Uric Acid: 5.7 mg/dL (ref 3.8–8.4)
WBC: 10.3 10*3/uL (ref 3.4–10.8)

## 2022-03-24 ENCOUNTER — Telehealth: Payer: Self-pay | Admitting: Nurse Practitioner

## 2022-03-24 ENCOUNTER — Other Ambulatory Visit: Payer: Self-pay | Admitting: Nurse Practitioner

## 2022-03-24 NOTE — Telephone Encounter (Signed)
Pt states it is just below the calf- states about 6 in about ankle

## 2022-03-24 NOTE — Telephone Encounter (Signed)
Spoke with patient and he agreed to have ultrasound of leg today to rule out a blood clot. Will order STAT

## 2022-03-24 NOTE — Addendum Note (Signed)
Addended by: Chevis Pretty on: 03/24/2022 02:19 PM   Modules accepted: Orders

## 2022-03-24 NOTE — Telephone Encounter (Signed)
Is pain in back of calf

## 2022-03-24 NOTE — Telephone Encounter (Signed)
Pt is not feeling any better- states left lower leg is black and blue, swollen. He can stand but he cannot fit a shoe on it. He says by end of the day half his leg is swollen

## 2022-03-24 NOTE — Telephone Encounter (Signed)
Is calf swollen and tight

## 2022-03-24 NOTE — Addendum Note (Signed)
Addended by: Chevis Pretty on: 03/24/2022 02:20 PM   Modules accepted: Orders

## 2022-03-25 ENCOUNTER — Ambulatory Visit (HOSPITAL_COMMUNITY)
Admission: RE | Admit: 2022-03-25 | Discharge: 2022-03-25 | Disposition: A | Discharge status: Home / Self Care | Payer: Medicare Other | Source: Ambulatory Visit | Attending: Nurse Practitioner | Admitting: Nurse Practitioner

## 2022-03-25 ENCOUNTER — Other Ambulatory Visit: Payer: Self-pay | Admitting: Nurse Practitioner

## 2022-03-25 DIAGNOSIS — M7989 Other specified soft tissue disorders: Secondary | ICD-10-CM | POA: Diagnosis not present

## 2022-03-25 DIAGNOSIS — R600 Localized edema: Secondary | ICD-10-CM | POA: Diagnosis not present

## 2022-03-25 MED ORDER — SULFAMETHOXAZOLE-TRIMETHOPRIM 800-160 MG PO TABS: 1.0000 | ORAL_TABLET | Freq: Two times a day (BID) | ORAL | 0 refills | Status: DC

## 2022-03-27 ENCOUNTER — Telehealth: Payer: Self-pay | Admitting: Nurse Practitioner

## 2022-06-12 ENCOUNTER — Ambulatory Visit (INDEPENDENT_AMBULATORY_CARE_PROVIDER_SITE_OTHER): Payer: Medicare Other | Admitting: Nurse Practitioner

## 2022-06-12 ENCOUNTER — Ambulatory Visit (INDEPENDENT_AMBULATORY_CARE_PROVIDER_SITE_OTHER): Payer: Medicare Other

## 2022-06-12 ENCOUNTER — Encounter: Payer: Self-pay | Admitting: Nurse Practitioner

## 2022-06-12 VITALS — BP 148/85 | HR 69 | Temp 98.0°F | Resp 20 | Ht 68.0 in | Wt 243.0 lb

## 2022-06-12 DIAGNOSIS — S8011XA Contusion of right lower leg, initial encounter: Secondary | ICD-10-CM

## 2022-06-12 DIAGNOSIS — M25511 Pain in right shoulder: Secondary | ICD-10-CM

## 2022-06-12 DIAGNOSIS — M1A0710 Idiopathic chronic gout, right ankle and foot, without tophus (tophi): Secondary | ICD-10-CM

## 2022-06-12 MED ORDER — PREDNISONE 20 MG PO TABS: 40.0000 mg | ORAL_TABLET | Freq: Every day | ORAL | 0 refills | Status: AC

## 2022-06-12 MED ORDER — ALLOPURINOL 300 MG PO TABS: 300.0000 mg | ORAL_TABLET | Freq: Every day | ORAL | 1 refills | Status: DC

## 2022-06-12 NOTE — Progress Notes (Signed)
Subjective:    Patient ID: Donald Butler, male    DOB: [birth year], 61 y.o.   MRN: [id number]   Chief Complaint: Leg Pain (Right leg red and swollen since fall on Sunday/) and Shoulder Pain (Right)   HPI Patient fell on Sunday. He says he was on his deck , which is 2 levels. He was walking on his deck and stepped on edge of his shoe near a step and fell. He landed mainly on his right shoulder. He did hit his head , but did not lose black out  He has a knot on his left leg that is hurting as well. He is mainly c/o right shoulder pain. Rates pain 2/10 when it is still and 8/10 with movement. He has had surgery on that shoulder in the  past.  Gout is flare up frequently even though o allopurinol '100mg'$  daily- wuld like toincrease dose. Review of Systems  Constitutional:  Negative for diaphoresis and fatigue.  Eyes:  Negative for pain.  Respiratory:  Negative for shortness of breath.   Cardiovascular:  Negative for chest pain, palpitations and leg swelling.  Gastrointestinal:  Negative for abdominal pain.  Endocrine: Negative for polydipsia.  Musculoskeletal:  Positive for arthralgias (right shoulder).  Skin:  Negative for rash.  Neurological:  Negative for dizziness, weakness and headaches.  Hematological:  Does not bruise/bleed easily.  All other systems reviewed and are negative.      Objective:   Physical Exam Vitals reviewed.  Constitutional:      Appearance: Normal appearance.  Cardiovascular:     Rate and Rhythm: Normal rate and regular rhythm.     Heart sounds: Normal heart sounds.  Pulmonary:     Breath sounds: Normal breath sounds.  Musculoskeletal:     Right lower leg: Edema (2+ edema with contusion) present.     Comments: Very limited movement of right shulder with pain on any movement at all. Grips equal bil   Neurological:     General: No focal deficit present.     Mental Status: He is alert and oriented to person, place, and time.  Psychiatric:        Mood and  Affect: Mood normal.        Behavior: Behavior normal.     BP (!) 148/85   Pulse 69   Temp 98 F (36.7 C) (Temporal)   Resp 20   Ht '5\' 8"'$  (1.727 m)   Wt 243 lb (110.2 kg)   BMI 36.95 kg/m   Right shoulder xray- no change fromprevious-[name] Done, FNP        Assessment & Plan:   Donald Butler in today with chief complaint of Leg Pain (Right leg red and swollen since fall on Sunday/) and Shoulder Pain (Right)   1. Acute pain of right shoulder Ice BID Rest Shoulder immobilizer - DG Shoulder Right - predniSONE (DELTASONE) 20 MG tablet; Take 2 tablets (40 mg total) by mouth daily with breakfast for 5 days. 2 po daily for 5 days  Dispense: 10 tablet; Refill: 0  2. Contusion of right leg, initial encounter Ice Bid May take weeks to resolve  3. Gout Low purine diet Increase allopurinol to '300mg'$  daily  The above assessment and management plan was discussed with the patient. The patient verbalized understanding of and has agreed to the management plan. Patient is aware to call the clinic if symptoms persist or worsen. Patient is aware when to return to the clinic for a follow-up visit.  Patient educated on when it is appropriate to go to the emergency department.   [name] Done, FNP

## 2022-06-12 NOTE — Patient Instructions (Signed)

## 2022-06-24 ENCOUNTER — Telehealth: Payer: Self-pay | Admitting: Nurse Practitioner

## 2022-06-25 ENCOUNTER — Telehealth: Payer: Self-pay

## 2022-06-25 NOTE — Telephone Encounter (Signed)
  [name] Received: Today [name], [name] Clinical Pool Phone Number: [phone]   Donald Butler's leg today.       [name] ([employer]) View All Conversations on this Encounter Images were sent on wifes Mychart because she wanted to send pictures. She feels his leg looks worse. Wants to know what you think he should do    Audrie Gallus Weld  P [facility] Clinical Pool (supporting [employer], MD)20 minutes ago (10:10 AM)    Donald Butler's leg today.  Attachments  [id number]    Attachments  [id number]

## 2022-06-25 NOTE — Telephone Encounter (Signed)
Patients wife states that patients leg is more red and she feels its infected. Will send pic so we can take a look

## 2022-06-26 MED ORDER — SULFAMETHOXAZOLE-TRIMETHOPRIM 800-160 MG PO TABS: 1.0000 | ORAL_TABLET | Freq: Two times a day (BID) | ORAL | 0 refills | Status: DC

## 2022-06-26 NOTE — Telephone Encounter (Signed)
Patient and wife notified and verbalized understanding.

## 2022-06-26 NOTE — Telephone Encounter (Signed)
Bactrim DS 1 po BID was sent to pharmacy If not getting better needs  to be seen for rocephin shot. Have them draw a line around reddness and if reddness goes outside of line tomorrow will need a rocephin shot.44.  [name] Done, FNP

## 2022-06-27 ENCOUNTER — Ambulatory Visit (INDEPENDENT_AMBULATORY_CARE_PROVIDER_SITE_OTHER): Payer: Medicare Other | Admitting: Nurse Practitioner

## 2022-06-27 ENCOUNTER — Encounter: Payer: Self-pay | Admitting: Nurse Practitioner

## 2022-06-27 VITALS — BP 117/78 | HR 81 | Temp 97.8°F | Resp 20 | Ht 68.0 in | Wt 243.0 lb

## 2022-06-27 DIAGNOSIS — L03115 Cellulitis of right lower limb: Secondary | ICD-10-CM | POA: Diagnosis not present

## 2022-06-27 MED ORDER — CEFTRIAXONE SODIUM 1 G IJ SOLR
1.0000 g | Freq: Once | INTRAMUSCULAR | Status: AC
  Administered 2022-06-27: 1 g via INTRAMUSCULAR

## 2022-06-27 NOTE — Progress Notes (Signed)
   Subjective:    Patient ID: Donald Butler, male    DOB: Apr 20, 1961, 61 y.o.   MRN: [id number]   Chief Complaint: Recheck right lower leg   HPI Patient fell down the steps several weeks ago and ad a large contusion on right shin. Area is now raised and red. Started on bactrim yesterday but surrounding redness has spread.       Review of Systems  Constitutional:  Negative for diaphoresis.  Eyes:  Negative for pain.  Respiratory:  Negative for shortness of breath.   Cardiovascular:  Negative for chest pain, palpitations and leg swelling.  Gastrointestinal:  Negative for abdominal pain.  Endocrine: Negative for polydipsia.  Skin:  Negative for rash.  Neurological:  Negative for dizziness, weakness and headaches.  Hematological:  Does not bruise/bleed easily.  All other systems reviewed and are negative.      Objective:   Physical Exam Cardiovascular:     Rate and Rhythm: Normal rate and regular rhythm.     Heart sounds: Normal heart sounds.  Pulmonary:     Effort: Pulmonary effort is normal.     Breath sounds: Normal breath sounds.  Neurological:     General: No focal deficit present.     Mental Status: He is oriented to person, place, and time.  Psychiatric:        Mood and Affect: Mood normal.        Behavior: Behavior normal.     BP 117/78   Pulse 81   Temp 97.8 F (36.6 C) (Temporal)   Resp 20   Ht '5\' 8"'$  (1.727 m)   Wt 243 lb (110.2 kg)   BMI 36.95 kg/m           Assessment & Plan:   Donald Butler in today with chief complaint of Recheck right lower leg   1. Cellulitis of right lower extremity Epsom salt soaks Avoid rubbing RTO prn - cefTRIAXone (ROCEPHIN) injection 1 g    The above assessment and management plan was discussed with the patient. The patient verbalized understanding of and has agreed to the management plan. Patient is aware to call the clinic if symptoms persist or worsen. Patient is aware when to return to the clinic for a  follow-up visit. Patient educated on when it is appropriate to go to the emergency department.   Donald Butler Done, FNP

## 2022-07-02 ENCOUNTER — Telehealth: Payer: Self-pay | Admitting: Nurse Practitioner

## 2022-07-03 NOTE — Telephone Encounter (Signed)
Can we do stat order to wound care. If cannot be seen in several days I need to know.

## 2022-07-03 NOTE — Telephone Encounter (Signed)
Patient scheduled for an appt tomorrow with PCP

## 2022-07-04 ENCOUNTER — Ambulatory Visit (INDEPENDENT_AMBULATORY_CARE_PROVIDER_SITE_OTHER): Payer: Medicare Other | Admitting: Nurse Practitioner

## 2022-07-04 ENCOUNTER — Encounter: Payer: Self-pay | Admitting: Nurse Practitioner

## 2022-07-04 VITALS — BP 150/88 | HR 88 | Temp 98.2°F | Resp 20 | Ht 68.0 in | Wt 242.0 lb

## 2022-07-04 DIAGNOSIS — S8011XA Contusion of right lower leg, initial encounter: Secondary | ICD-10-CM | POA: Diagnosis not present

## 2022-07-04 NOTE — Patient Instructions (Signed)
Wound Packing Wound packing usually involves placing a moistened packing material into your wound and then covering it with an outer bandage (dressing). This helps support the healing of deep tissue and tissue under the skin. It also helps prevent bleeding, infection, and further injury. Wounds are packed until deep tissues heal. The time it takes for this to happen is different for everyone. Your health care provider will show you how to pack and dress your wound. Using gloves and a clean technique is important to avoid spreading germs into your wound. Supplies needed: Soap and water. Disposable gloves. Cleansing or wetting solution, such as saline, germ-free (sterile) water, or an antiseptic solution. Clean bowl. Clean packing material, such as gauze, gauze sponges, or rolled gauze. Clean paper towels. Outer dressing. This includes the cover dressing and tape, or a dressing with an adhesive border. Cotton-tipped swabs. Small plastic bag for trash. How to pack your wound Follow your health care provider's instructions on how often you need to change dressings and pack your wound. You will likely be asked to change your dressings 1 to 2 times a day. Preparing to change the wound packing If needed, take pain medicine 30 minutes before you pack your wound as told by your health care provider. Preparing the new packing material  Clean and disinfect your work surface or countertop. Set a plastic bag on or near your work surface. Wash your hands with soap and water for at least 20 seconds before you change the dressing. If soap and water are not available, use hand sanitizer. Put a clean paper towel on the counter. Put a clean bowl on the towel. Only touch the outside of the bowl when handling it. Pour the cleansing or wetting solution that your health care provider tells you to use into the bowl. Select and cut your packing material to fit the size of your wound. Avoid using multiple pieces of  packing material. Drop it into the bowl. Cut tape strips that you will use to seal the outer dressing, if needed. Put gauze pads for cleansing and cotton-tipped swabs on the clean paper towel. Removing the old packing material and dressing Put on a set of gloves. Gently remove the old dressing and packing material. Make sure to check how the drainage looks or if there is any odor. Clean or rinse (irrigate) the wound. Remove your gloves. Put the removed items, including gloves, into the plastic bag to throw away later. Wash your hands again with soap and water for at least 20 seconds. If soap and water are not available, use hand sanitizer. Applying the new packing material and dressing  Put on a new set of gloves. Squeeze the packing material in the bowl to release the extra liquid. The packing material should be moist, but not dripping wet. Gently place the packing material into the wound. Use a cotton-tipped swab to guide it into place, filling all of the space. Do not overpack the wound bed. Dry your gloved fingertips on the paper towel. Open up your outer dressing supplies and put them on a dry part of the paper towel. Keep them from getting wet. Place the outer dressing over the packed wound. Tape the edges of the outer dressing in place. Remove your gloves. Wash your hands again with soap and water for at least 20 seconds. If soap and water are not available, use hand sanitizer. Put the removed items, including gloves, into the plastic bag to throw away. Clean and disinfect your work  surface or countertop. General tips Follow your health care provider's instructions on how much to pack the wound. At first, you may need to pack it more fully to help stop bleeding. As the wound begins to heal inside, you will use less packing material and pack the wound loosely to allow the tissue to heal slowly from the inside out. Do not take baths, swim, or use a hot tub until your health care  provider approves. Ask your health care provider if you may take showers. You may only be allowed to take sponge baths. Keep the dressing clean and dry. Follow any other instructions given by your health care provider on how to aid healing. This may include applying warm or cold compresses, raising (elevating) the affected area, or wearing a compression dressing. Check your wound site every day for signs of infection. Check for: More redness, swelling, or pain. More fluid or blood. Warmth or hardness (induration). Pus or a bad smell. Protect your wound from the sun when you are outside for the first 6 months, or for as long as told by your health care provider. Cover up the scar area or apply sunscreen that has an SPF of at least 80. Keep all follow-up visits. This is important. Contact a health care provider if: Your pain is not controlled with pain medicine. You have more drainage, redness, swelling, or pain at your wound site. You have new rash, warmth, or induration around the wound. You have a fever or chills. Your wound becomes larger or deeper. Get help right away if: The tissue inside your wound changes color from pink to white, yellow, or black. You notice a bad smell or pus coming from the wound site. You are having trouble packing your wound. Your wound is bleeding, and the bleeding does not stop with gentle pressure. These symptoms may represent a serious problem that is an emergency. Do not wait to see if the symptoms will go away. Get medical help right away. Call your local emergency services (911 in the [place] Do not drive yourself to the hospital. Summary Wound packing usually involves placing a moistened packing material into your wound and then covering it with an outer bandage (dressing). Follow your health care provider's instructions on how often you need to change dressings and pack your wound. You will likely be asked to change dressings 1 to 2 times a day. When  packing your wound, it is important to use gloves to avoid spreading germs into the wound. Check your wound site every day for signs of infection. This information is not intended to replace advice given to you by your health care provider. Make sure you discuss any questions you have with your health care provider. Document Revised: 12/11/2020 Document Reviewed: 12/11/2020 Elsevier Patient Education  [place].

## 2022-07-04 NOTE — Progress Notes (Signed)
Subjective:    Patient ID: Donald Butler, male    DOB: 1960-12-10, 61 y.o.   MRN: [id number]  Patient feel down some steps several weeks ago and injured his right shin. He developed a large hematoma which became infected. Her was given rocephin shot. On 06/27/22. He says he does not think leg is any better.   Review of Systems  Constitutional:  Negative for diaphoresis.  Eyes:  Negative for pain.  Respiratory:  Negative for shortness of breath.   Cardiovascular:  Negative for chest pain, palpitations and leg swelling.  Gastrointestinal:  Negative for abdominal pain.  Endocrine: Negative for polydipsia.  Skin:  Negative for rash.  Neurological:  Negative for dizziness, weakness and headaches.  Hematological:  Does not bruise/bleed easily.  All other systems reviewed and are negative.      Objective:   Physical Exam Constitutional:      Appearance: Normal appearance.  Cardiovascular:     Rate and Rhythm: Normal rate and regular rhythm.     Heart sounds: Normal heart sounds.  Pulmonary:     Effort: Pulmonary effort is normal.     Breath sounds: Normal breath sounds.  Skin:    General: Skin is warm.     Comments: 10cm tight nodule- mild erythema and tender to touch  Neurological:     General: No focal deficit present.     Mental Status: He is alert and oriented to person, place, and time.    BP (!) 150/88   Pulse 88   Temp 98.2 F (36.8 C) (Temporal)   Resp 20   Ht '5\' 8"'$  (1.727 m)   Wt 242 lb (109.8 kg)   SpO2 95%   BMI 36.80 kg/m    I & D  Date/Time: 07/04/2022 9:34 AM  Performed by: Chevis Pretty, FNP Authorized by: Hassell Done [name], FNP   Consent:    Consent obtained:  Verbal   Consent given by:  Patient   Risks, benefits, and alternatives were discussed: yes     Risks discussed:  Bleeding   Alternatives discussed:  No treatment and observation Location:    Type:  Hematoma   Size:  10cm   Location:  Lower extremity   Lower extremity  location:  Leg   Leg location:  R lower leg Pre-procedure details:    Skin preparation:  Povidone-iodine Sedation:    Sedation type:  None Anesthesia:    Anesthesia method:  Local infiltration   Local anesthetic:  Lidocaine 1% WITH epi Procedure type:    Complexity:  Simple Procedure details:    Needle aspiration: no     Incision types:  Stab incision   Incision depth:  Subcutaneous   Scalpel blade:  15   Wound management:  Debrided   Drainage:  Bloody   Drainage amount:  Copious   Wound treatment:  Wound left open   Packing materials:  1/4 in iodoform gauze Post-procedure details:    Procedure completion:  Tolerated well, no immediate complications       Assessment & Plan:   Donald Butler in today with chief complaint of Recheck right leg   1. Hematoma of right lower leg Pull out 1/4 inch of packing daily Keep clean and dry Follow up monday    The above assessment and management plan was discussed with the patient. The patient verbalized understanding of and has agreed to the management plan. Patient is aware to call the clinic if symptoms persist or worsen. Patient is  aware when to return to the clinic for a follow-up visit. Patient educated on when it is appropriate to go to the emergency department.   [name] Done, FNP

## 2022-07-07 ENCOUNTER — Encounter: Payer: Self-pay | Admitting: Nurse Practitioner

## 2022-07-07 ENCOUNTER — Other Ambulatory Visit: Payer: Self-pay

## 2022-07-07 ENCOUNTER — Ambulatory Visit (INDEPENDENT_AMBULATORY_CARE_PROVIDER_SITE_OTHER): Payer: Medicare Other | Admitting: Nurse Practitioner

## 2022-07-07 VITALS — BP 146/86 | HR 69 | Temp 98.2°F | Resp 20 | Ht 68.0 in | Wt 239.0 lb

## 2022-07-07 DIAGNOSIS — L03115 Cellulitis of right lower limb: Secondary | ICD-10-CM

## 2022-07-07 DIAGNOSIS — Z5189 Encounter for other specified aftercare: Secondary | ICD-10-CM

## 2022-07-07 NOTE — Progress Notes (Signed)
   Subjective:    Patient ID: Donald Butler, male    DOB: June 29, 1961, 61 y.o.   MRN: [id number]   Chief Complaint: Recheck leg   HPI Patient fell down steps several weeks ago and developed a large hematoma on right shin area. It became infected and he was given a rocephin shot and bactrim. Last week we had to I & D area and a large blood clot came out. Left a large hole in leg. Packing was inserted. He is here today for packing change. A referral has been done to wound cre but have not heard anything from them.    Review of Systems  Constitutional:  Negative for diaphoresis.  Eyes:  Negative for pain.  Respiratory:  Negative for shortness of breath.   Cardiovascular:  Negative for chest pain, palpitations and leg swelling.  Gastrointestinal:  Negative for abdominal pain.  Endocrine: Negative for polydipsia.  Skin:  Negative for rash.  Neurological:  Negative for dizziness, weakness and headaches.  Hematological:  Does not bruise/bleed easily.  All other systems reviewed and are negative.      Objective:   Physical Exam Vitals and nursing note reviewed.  Constitutional:      Appearance: Normal appearance. He is well-developed.  Neck:     Thyroid: No thyroid mass or thyromegaly.     Vascular: No carotid bruit or JVD.     Trachea: Phonation normal.  Cardiovascular:     Rate and Rhythm: Normal rate and regular rhythm.  Pulmonary:     Effort: Pulmonary effort is normal. No respiratory distress.     Breath sounds: Normal breath sounds.  Abdominal:     General: Bowel sounds are normal.     Palpations: Abdomen is soft.     Tenderness: There is no abdominal tenderness.  Musculoskeletal:        General: Normal range of motion.     Cervical back: Normal range of motion and neck supple.  Lymphadenopathy:     Cervical: No cervical adenopathy.  Skin:    General: Skin is warm and dry.     Comments: Packing changed today.   Neurological:     Mental Status: He is alert and  oriented to person, place, and time.  Psychiatric:        Behavior: Behavior normal.        Thought Content: Thought content normal.        Judgment: Judgment normal.    BP (!) 146/86   Pulse 69   Temp 98.2 F (36.8 C) (Temporal)   Resp 20   Ht '5\' 8"'$  (1.727 m)   Wt 239 lb (108.4 kg)   SpO2 97%   BMI 36.34 kg/m    Wound packing removed and new packing inserted.     Assessment & Plan:  Donald Butler in today with chief complaint of Recheck leg   1. Encounter for wound care [place] 1/4 inch of packing daily Recheck in 1 week.    The above assessment and management plan was discussed with the patient. The patient verbalized understanding of and has agreed to the management plan. Patient is aware to call the clinic if symptoms persist or worsen. Patient is aware when to return to the clinic for a follow-up visit. Patient educated on when it is appropriate to go to the emergency department.   [name] Done, FNP

## 2022-07-14 ENCOUNTER — Encounter: Payer: Self-pay | Admitting: Nurse Practitioner

## 2022-07-14 ENCOUNTER — Ambulatory Visit (INDEPENDENT_AMBULATORY_CARE_PROVIDER_SITE_OTHER): Payer: Medicare Other | Admitting: Nurse Practitioner

## 2022-07-14 VITALS — BP 138/78 | HR 78 | Temp 97.6°F | Ht 68.0 in | Wt 239.0 lb

## 2022-07-14 DIAGNOSIS — Z4800 Encounter for change or removal of nonsurgical wound dressing: Secondary | ICD-10-CM

## 2022-07-14 NOTE — Progress Notes (Signed)
   Subjective:    Patient ID: Donald Butler, male    DOB: 03-Apr-1961, 61 y.o.   MRN: [id number]   Chief Complaint: wound care  HPI  Patient fell down some steps several weeks ago and developed a large hematoma on right shin. The area became infected. He has been treated with oral antibiotics and rocephin. We debrided area 2 weeks ago ad extracted a large blood clot. He now has a large hole in his leg that we have ben packing. He is here to day for pacing change.    Review of Systems  Constitutional:  Negative for diaphoresis.  Eyes:  Negative for pain.  Respiratory:  Negative for shortness of breath.   Cardiovascular:  Negative for chest pain, palpitations and leg swelling.  Gastrointestinal:  Negative for abdominal pain.  Endocrine: Negative for polydipsia.  Skin:  Negative for rash.  Neurological:  Negative for dizziness, weakness and headaches.  Hematological:  Does not bruise/bleed easily.  All other systems reviewed and are negative.      Objective:   Physical Exam Constitutional:      Appearance: Normal appearance.  Cardiovascular:     Rate and Rhythm: Normal rate and regular rhythm.     Heart sounds: Normal heart sounds.  Pulmonary:     Breath sounds: Normal breath sounds.  Skin:    Comments: Right leg wound has no erythema today Packing removed and replaced  Neurological:     General: No focal deficit present.     Mental Status: He is alert and oriented to person, place, and time.  Psychiatric:        Mood and Affect: Mood normal.        Behavior: Behavior normal.            Assessment & Plan:   Donald Butler in today with chief complaint of No chief complaint on file.   1. Encounter for removal of abscess packing Continue with packing care previously discussed Will see him on Friday for packing change then will release him to wound care as scheduled on 07/22/22.    The above assessment and management plan was discussed with the patient. The  patient verbalized understanding of and has agreed to the management plan. Patient is aware to call the clinic if symptoms persist or worsen. Patient is aware when to return to the clinic for a follow-up visit. Patient educated on when it is appropriate to go to the emergency department.   [name] Done, FNP

## 2022-07-18 ENCOUNTER — Ambulatory Visit (INDEPENDENT_AMBULATORY_CARE_PROVIDER_SITE_OTHER): Payer: Medicare Other | Admitting: Nurse Practitioner

## 2022-07-18 ENCOUNTER — Telehealth: Payer: Self-pay | Admitting: Nurse Practitioner

## 2022-07-18 ENCOUNTER — Encounter: Payer: Self-pay | Admitting: Nurse Practitioner

## 2022-07-18 VITALS — BP 153/84 | HR 91 | Temp 97.9°F | Resp 20 | Ht 68.0 in | Wt 239.0 lb

## 2022-07-18 DIAGNOSIS — Z5189 Encounter for other specified aftercare: Secondary | ICD-10-CM | POA: Diagnosis not present

## 2022-07-18 NOTE — Progress Notes (Signed)
   Subjective:    Patient ID: Donald Butler, male    DOB: [birth year], 61 y.o.   MRN: [id number]   Chief Complaint: packing change   HPI Patient has right shin ijury that had to be debrided. He is here for packing change, has appointment with wound car eon 07/23/22.    Review of Systems  Constitutional:  Negative for diaphoresis.  Eyes:  Negative for pain.  Respiratory:  Negative for shortness of breath.   Cardiovascular:  Negative for chest pain, palpitations and leg swelling.  Gastrointestinal:  Negative for abdominal pain.  Endocrine: Negative for polydipsia.  Skin:  Negative for rash.  Neurological:  Negative for dizziness, weakness and headaches.  Hematological:  Does not bruise/bleed easily.  All other systems reviewed and are negative.      Objective:   Physical Exam Constitutional:      Appearance: Normal appearance.  Cardiovascular:     Rate and Rhythm: Normal rate and regular rhythm.  Pulmonary:     Breath sounds: Normal breath sounds.  Skin:    General: Skin is warm.     Comments: Wound packing has sero sanguious drainage in it. No foul odor. Mils erythema around wound.  Neurological:     General: No focal deficit present.     Mental Status: He is alert and oriented to person, place, and time.  Psychiatric:        Mood and Affect: Mood normal.        Behavior: Behavior normal.    BP (!) 153/84   Pulse 91   Temp 97.9 F (36.6 C) (Temporal)   Resp 20   Ht '5\' 8"'$  (1.727 m)   Wt 239 lb (108.4 kg)   BMI 36.34 kg/m   Packing removed and new inserted       Assessment & Plan:   Donald Butler in today with chief complaint of packing change   1. Encounter for wound care Remove 1/4 inch of packing daily Keep appointment with wound care-  make sure they send me a report    The above assessment and management plan was discussed with the patient. The patient verbalized understanding of and has agreed to the management plan. Patient is aware to call the  clinic if symptoms persist or worsen. Patient is aware when to return to the clinic for a follow-up visit. Patient educated on when it is appropriate to go to the emergency department.   [name] Done, FNP

## 2022-07-22 ENCOUNTER — Encounter (HOSPITAL_BASED_OUTPATIENT_CLINIC_OR_DEPARTMENT_OTHER): Payer: Medicare Other | Attending: Internal Medicine | Admitting: Internal Medicine

## 2022-07-22 DIAGNOSIS — W19XXXA Unspecified fall, initial encounter: Secondary | ICD-10-CM | POA: Diagnosis not present

## 2022-07-22 DIAGNOSIS — I87311 Chronic venous hypertension (idiopathic) with ulcer of right lower extremity: Secondary | ICD-10-CM | POA: Insufficient documentation

## 2022-07-22 DIAGNOSIS — I10 Essential (primary) hypertension: Secondary | ICD-10-CM | POA: Diagnosis not present

## 2022-07-22 DIAGNOSIS — I4891 Unspecified atrial fibrillation: Secondary | ICD-10-CM | POA: Diagnosis not present

## 2022-07-22 DIAGNOSIS — T798XXA Other early complications of trauma, initial encounter: Secondary | ICD-10-CM

## 2022-07-22 DIAGNOSIS — I2510 Atherosclerotic heart disease of native coronary artery without angina pectoris: Secondary | ICD-10-CM | POA: Diagnosis not present

## 2022-07-22 DIAGNOSIS — E785 Hyperlipidemia, unspecified: Secondary | ICD-10-CM | POA: Insufficient documentation

## 2022-07-22 DIAGNOSIS — L97812 Non-pressure chronic ulcer of other part of right lower leg with fat layer exposed: Secondary | ICD-10-CM | POA: Insufficient documentation

## 2022-07-22 DIAGNOSIS — I872 Venous insufficiency (chronic) (peripheral): Secondary | ICD-10-CM | POA: Diagnosis not present

## 2022-07-22 NOTE — Progress Notes (Signed)
MUJTABA, BOLLIG ([id number]) [id number] Page 1 of 10 Visit Report for 07/22/2022 Allergy List Details Patient Name: Date of Service: Donald Butler, Donald LFO RD K. 07/22/2022 9:45 A M Medical Record Number: [id number] Patient Account Number: [id number] Date of Birth/Sex: Treating RN: 1961-03-18 (61 y.o. Donald Butler Primary Care Emileo Semel: Hassell Done, Donald Other Clinician: Referring Irmgard Rampersaud: Treating Desaray Marschner/Extender: Cathleen Fears, Donald Butler Allergies Active Allergies penicillin Reaction: hives, SHOB Allergy Notes Electronic Signature(s) Signed: 07/22/2022 3:48:14 PM By: Sharyn Creamer RN, BSN Entered By: Sharyn Creamer on 07/22/2022 09:46:50 -------------------------------------------------------------------------------- Arrival Information Details Patient Name: Date of Service: Donald Monarch LFO RD K. 07/22/2022 9:45 A M Medical Record Number: [id number] Patient Account Number: [id number] Date of Birth/Sex: Treating RN: 02-Mar-1961 (60 y.o. Donald Butler Primary Care Kaedon Fanelli: Hassell Done, Donald Other Clinician: Referring Jenea Dake: Treating Laurita Peron/Extender: Cathleen Fears, Donald Butler Visit Information Patient Arrived: Ambulatory Arrival Time: 09:41 Accompanied By: self Transfer Assistance: None Patient Identification Verified: Yes Secondary Verification Process Completed: Yes Patient Requires Transmission-Based Precautions: No Patient Has Alerts: Yes Patient Alerts: Patient on Blood Thinner Aspirin 26m Electronic Signature(s) Signed: 07/22/2022 3:48:14 PM By: PSharyn CreamerRN, BSN Entered By: PSharyn Creameron 07/22/2022 09:44:24 [name]([id number] Page 2 of 10 -------------------------------------------------------------------------------- Clinic Level of Care Assessment Details Patient Name: Date of Service: [place], PERRALFO RD K.  07/22/2022 9:45 A M Medical Record Number: [id number] Account Number: [id number] of Birth/Sex: Treating RN: [birth year](61y.o. MHessie DienerPrimary Care Danial Hlavac: MHassell Done Donald Other Clinician: Referring Coltan Spinello: Treating Dawnna Gritz/Extender: HCathleen Fears Donald Butler Clinic Level of Care Assessment Items TOOL 2 Quantity Score X- 1 Butler Use when only an EandM is performed on the INITIAL visit ASSESSMENTS - Nursing Assessment / Reassessment X- 1 20 General Physical Exam (combine w/ comprehensive assessment (listed just below) when performed on new pt. evals) X- 1 25 Comprehensive Assessment (HX, ROS, Risk Assessments, Wounds Hx, etc.) ASSESSMENTS - Wound and Skin A ssessment / Reassessment _0  - Butler Simple Wound Assessment / Reassessment - one wound X- 1 5 Complex Wound Assessment / Reassessment - multiple wounds X- 1 10 Dermatologic / Skin Assessment (not related to wound area) ASSESSMENTS - Ostomy and/or Continence Assessment and Care _1  - Butler Incontinence Assessment and Management _2  - Butler Ostomy Care Assessment and Management (repouching, etc.) PROCESS - Coordination of Care _3  - Butler Simple Patient / Family Education for ongoing care X- 1 20 Complex (extensive) Patient / Family Education for ongoing care X- 1 10 Staff obtains CProgrammer, systems Records, T Results / Process Orders est _4  - Butler Staff telephones HHA, Nursing Homes / Clarify orders / etc _5  - Butler Routine Transfer to another Facility (non-emergent condition) _6  - Butler Routine Hospital Admission (non-emergent condition) _7  - Butler New Admissions / IBiomedical engineer/ Ordering NPWT Apligraf, etc. , _8  - Butler Emergency Hospital Admission (emergent condition) _9  - Butler Simple Discharge Coordination X- 1 15 Complex (extensive) Discharge Coordination PROCESS - Special Needs _10  - Butler Pediatric / Minor Patient Management _11  - Butler Isolation Patient Management _12  - Butler Hearing /  Language / Visual special needs _13  - Butler Assessment of Community assistance (transportation, D/C planning, etc.) _14  - Butler Additional assistance / Altered mentation _15  - Butler Support Surface(s) Assessment (bed, cushion, seat, etc.) INTERVENTIONS - Wound Cleansing / Measurement X- 1 5 Wound Imaging (photographs - any number of wounds) _16  - Butler Wound Tracing (instead of photographs) X- 1 5  Simple Wound Measurement - one wound _0  - Butler Complex Wound Measurement - multiple wounds _1  - Butler Simple Wound Cleansing - one wound _2  - Butler Complex Wound Cleansing - multiple wounds INTERVENTIONS - Wound Dressings _3  - Butler Small Wound Dressing one or multiple wounds Donald Butler, Donald Butler ([id number]) [id number] Page 3 of 10 _4  - Butler Medium Wound Dressing one or multiple wounds X- 1 20 Large Wound Dressing one or multiple wounds <[id number]>_0<\/[id number]>_5  - Butler Application of Medications - injection INTERVENTIONS - Miscellaneous _6  - Butler External ear exam _7  - Butler Specimen Collection (cultures, biopsies, blood, body fluids, etc.) _8  - Butler Specimen(s) / Culture(s) sent or taken to Lab for analysis _9  - Butler Patient Transfer (multiple staff / Civil Service fast streamer / Similar devices) _10  - Butler Simple Staple / Suture removal (25 or less) _11  - Butler Complex Staple / Suture removal (26 or more) _12  - Butler Hypo / Hyperglycemic Management (close monitor of Blood Glucose) X- 1 15 Ankle / Brachial Index (ABI) - do not check if billed separately Has the patient been seen at the hospital within the last three years: Yes Total Score: 150 Level Of Care: New/Established - Level 4 Electronic Signature(s) Signed: 07/22/2022 3:03:04 PM By: Deon Pilling RN, BSN Entered By: Deon Pilling on 07/22/2022 10:32:30 -------------------------------------------------------------------------------- Encounter Discharge Information Details Patient Name: Date of Service: Donald Monarch LFO RD K. 07/22/2022 9:45 A M Medical Record Number: [id number] Patient Account  Number: [id number] Date of Birth/Sex: Treating RN: September 22, 1960 (61 y.o. Donald Butler Primary Care Aneya Daddona: Hassell Done, Donald Other Clinician: Referring Hazel Leveille: Treating Panfilo Ketchum/Extender: Cathleen Fears, Donald Butler Encounter Discharge Information Items Discharge Condition: Stable Ambulatory Status: Ambulatory Discharge Destination: Home Transportation: Private Auto Accompanied By: self Schedule Follow-up Appointment: Yes Clinical Summary of Care: Electronic Signature(s) Signed: 07/22/2022 3:03:04 PM By: Deon Pilling RN, BSN Entered By: Deon Pilling on 07/22/2022 10:33:57 -------------------------------------------------------------------------------- Lower Extremity Assessment Details Patient Name: Date of Service: Donald Monarch LFO RD K. 07/22/2022 9:45 A M Medical Record Number: [id number] Patient Account Number: [id number] Date of Birth/Sex: Treating RN: [birth year] (61 y.o. Donald Butler Primary Care Niguel Moure: Hassell Done, Donald Other Clinician: Referring Dimple Bastyr: Treating Chrysa Rampy/Extender: Cathleen Fears, Donald Weeks in TreatmentALYN, JURNEY ([id number]) [id number] Page 4 of 10 Edema Assessment Assessed: [Left: No] [Right: Yes] [Left: Edema] [Right: :] Calf Left: Right: Point of Measurement: From Medial Instep 43.5 cm Ankle Left: Right: Point of Measurement: From Medial Instep 27 cm Vascular Assessment Pulses: Dorsalis Pedis Palpable: [Right:Yes] Blood Pressure: Brachial: [Right:161] Ankle: [Right:Dorsalis Pedis: 185 1.15] Electronic Signature(s) Signed: 07/22/2022 3:48:14 PM By: Sharyn Creamer RN, BSN Entered By: Sharyn Creamer on 07/22/2022 09:55:50 -------------------------------------------------------------------------------- Multi Wound Chart Details Patient Name: Date of Service: Donald Monarch LFO RD K. 07/22/2022 9:45 A M Medical Record Number: [id number] Patient  Account Number: [id number] Date of Birth/Sex: Treating RN: October 24, 1960 (61 y.o. M) Primary Care Dutchess Crosland: Hassell Done, Donald Other Clinician: Referring Carmon Brigandi: Treating Coburn Knaus/Extender: Cathleen Fears, Donald Butler Vital Signs Height(in): 69 Pulse(bpm): 89 Weight(lbs): 240 Blood Pressure(mmHg): 161/95 Body Mass Index(BMI): 35.4 Temperature(F): 99.1 Respiratory Rate(breaths/min): 18 [1:Photos:] [N/A:N/A] Right, Lateral Lower Leg N/A N/A Wound Location: Trauma N/A N/A Wounding Event: T be determined o N/A N/A Primary Etiology: Coronary Artery Disease, N/A N/A Comorbid History: Hypertension, Gout 06/11/2022 N/A N/A Date Acquired: Butler N/A N/A Weeks of Treatment: Open N/A N/A Wound Status: No N/A N/A Wound Recurrence: Butler.9x1.1x1.3 N/A N/A Measurements L x W x D (cm) Butler.778 N/A N/A A (  cm) : Donald Butler, Donald Butler ([id number]) [id number] Page 5 of 10 1.011 N/A N/A Volume (cm) : 10 Starting Position 1 (o'clock): 3 Ending Position 1 (o'clock): 2.8 Maximum Distance 1 (cm): Yes N/A N/A Undermining: Full Thickness Without Exposed N/A N/A Classification: Support Structures Medium N/A N/A Exudate Amount: Serosanguineous N/A N/A Exudate Type: red, brown N/A N/A Exudate Color: Large (67-100%) N/A N/A Granulation Amount: Red N/A N/A Granulation Quality: Small (1-33%) N/A N/A Necrotic Amount: Fat Layer (Subcutaneous Tissue): Yes N/A N/A Exposed Structures: Fascia: No Tendon: No Muscle: No Joint: No Bone: No Maceration: Yes N/A N/A Periwound Skin Moisture: Treatment Notes Wound #1 (Lower Leg) Wound Laterality: Right, Lateral Cleanser Soap and Water Discharge Instruction: May shower and wash wound with dial antibacterial soap and water prior to dressing change. Wound Cleanser Discharge Instruction: Cleanse the wound with wound cleanser prior to applying a clean dressing using gauze sponges, not  tissue or cotton balls. Peri-Wound Care Topical Gentamicin Discharge Instruction: As directed by physician Mupirocin Ointment Discharge Instruction: Apply Mupirocin (Bactroban) as instructed Primary Dressing Sorbalgon Ag Ribbon, 1x12 (in/in) Discharge Instruction: Lightly pack into undermining and tunneling. Secondary Dressing ABD Pad, 8x10 Discharge Instruction: Apply over primary dressing as directed. Woven Gauze Sponge, Non-Sterile 4x4 in Discharge Instruction: Apply over primary dressing as directed. Secured With Compression Wrap ThreePress (3 layer compression wrap) Discharge Instruction: Apply three layer compression as directed. Compression Stockings Add-Ons Electronic Signature(s) Signed: 07/22/2022 11:32:14 AM By: Kalman Shan DO Entered By: Kalman Shan on 07/22/2022 10:38:21 -------------------------------------------------------------------------------- Multi-Disciplinary Care Plan Details Patient Name: Date of Service: Donald Monarch LFO RD K. 07/22/2022 9:45 A M Medical Record Number: [id number] Patient Account Number: [id number] FABRIZZIO, MARCELLA ([id number]) [id number] Page 6 of 10 Date of Birth/Sex: Treating RN: 02/07/1961 (61 y.o. Lorette Ang, Meta.Reding Primary Care Tarquin Welcher: Other Clinician: Hassell Done, Donald Referring Ringo Sherod: Treating Natasia Sanko/Extender: Cathleen Fears, Donald Butler Active Inactive Orientation to the Wound Care Program Nursing Diagnoses: Knowledge deficit related to the wound healing center program Goals: Patient/caregiver will verbalize understanding of the [place] Program Date Initiated: 07/22/2022 Target Resolution Date: 08/22/2022 Goal Status: Active Interventions: Provide education on orientation to the wound center Notes: Pain, Acute or Chronic Nursing Diagnoses: Pain, acute or chronic: actual or potential Potential alteration in comfort, pain Goals: Patient  will verbalize adequate pain control and receive pain control interventions during procedures as needed Date Initiated: 07/22/2022 Target Resolution Date: 08/21/2022 Goal Status: Active Patient/caregiver will verbalize comfort level met Date Initiated: 07/22/2022 Target Resolution Date: 08/22/2022 Goal Status: Active Interventions: Encourage patient to take pain medications as prescribed Provide education on pain management Treatment Activities: Administer pain control measures as ordered : 07/22/2022 Notes: Venous Leg Ulcer Nursing Diagnoses: Potential for venous Insuffiency (use before diagnosis confirmed) Goals: Patient will maintain optimal edema control Date Initiated: 07/22/2022 Target Resolution Date: 08/21/2022 Goal Status: Active Interventions: Assess peripheral edema status every visit. Compression as ordered Treatment Activities: Therapeutic compression applied : 07/22/2022 Notes: Electronic Signature(s) Signed: 07/22/2022 3:03:04 PM By: Deon Pilling RN, BSN Entered By: Deon Pilling on 07/22/2022 10:31:30 Donald Butler ([id number]) [id number] Page 7 of 10 -------------------------------------------------------------------------------- Pain Assessment Details Patient Name: Date of Service: Donald Butler, Donald Butler LFO RD K. 07/22/2022 9:45 A M Medical Record Number: [id number] Patient Account Number: [id number] Date of Birth/Sex: Treating RN: 17-Jun-1961 (60 y.o. Donald Butler Primary Care Diamone Whistler: Hassell Done, Donald Other Clinician: Referring Chales Pelissier: Treating Hazelle Woollard/Extender: Cathleen Fears, Donald Butler Active Problems Location of Pain Severity and Description  of Pain Patient Has Paino No Site Locations Pain Management and Medication Current Pain Management: Electronic Signature(s) Signed: 07/22/2022 3:48:14 PM By: Sharyn Creamer RN, BSN Entered By: Sharyn Creamer on 07/22/2022  10:11:09 -------------------------------------------------------------------------------- Patient/Caregiver Education Details Patient Name: Date of Service: Donald Monarch LFO RD K. 12/5/2023andnbsp9:45 A M Medical Record Number: [id number] Patient Account Number: [id number] Date of Birth/Gender: Treating RN: 07-23-1961 (61 y.o. Donald Butler Primary Care Physician: Hassell Done, Donald Other Clinician: Referring Physician: Treating Physician/Extender: Cathleen Fears, Donald Butler Education Assessment Education Provided To: Patient Education Topics Provided Welcome T [facility] [place]: o Handouts: Welcome T [facility] [place] o Methods: Explain/Verbal Responses: Reinforcements needed COMPTON, BRIGANCE ([id number]) [id number] Page 8 of 10 Electronic Signature(s) Signed: 07/22/2022 3:03:04 PM By: Deon Pilling RN, BSN Entered By: Deon Pilling on 07/22/2022 10:31:38 -------------------------------------------------------------------------------- Wound Assessment Details Patient Name: Date of Service: Donald Monarch LFO RD K. 07/22/2022 9:45 A M Medical Record Number: [id number] Patient Account Number: [id number] Date of Birth/Sex: Treating RN: [birth year] (61 y.o. Donald Butler Primary Care Elianys Conry: Hassell Done, Donald Other Clinician: Referring Kenniyah Sasaki: Treating Elisheva Fallas/Extender: Cathleen Fears, Donald Butler Wound Status Wound Number: 1 Primary Etiology: T be determined o Wound Location: Right, Lateral Lower Leg Wound Status: Open Wounding Event: Trauma Comorbid History: Coronary Artery Disease, Hypertension, Gout Date Acquired: 06/11/2022 Weeks Of Treatment: Butler Clustered Wound: No Photos Wound Measurements Length: (cm) Butler.9 Width: (cm) 1.1 Depth: (cm) 1.3 Area: (cm) Butler.778 Volume: (cm) 1.011 % Reduction in Area: % Reduction in Volume: Tunneling: No Undermining:  Yes Starting Position (o'clock): 10 Ending Position (o'clock): 3 Maximum Distance: (cm) 2.8 Wound Description Classification: Full Thickness Without Exposed Suppor Exudate Amount: Medium Exudate Type: Serosanguineous Exudate Color: red, brown t Structures Foul Odor After Cleansing: No Slough/Fibrino Yes Wound Bed Granulation Amount: Large (67-100%) Exposed Structure Granulation Quality: Red Fascia Exposed: No Necrotic Amount: Small (1-33%) Fat Layer (Subcutaneous Tissue) Exposed: Yes Necrotic Quality: Adherent Slough Tendon Exposed: No Muscle Exposed: No Joint Exposed: No Bone Exposed: No Periwound Skin Texture Texture Color No Abnormalities Noted: No No Abnormalities Noted: No MONT, JAGODA ([id number]) [id number] Page 9 of 10 Moisture No Abnormalities Noted: No Maceration: Yes Treatment Notes Wound #1 (Lower Leg) Wound Laterality: Right, Lateral Cleanser Soap and Water Discharge Instruction: May shower and wash wound with dial antibacterial soap and water prior to dressing change. Wound Cleanser Discharge Instruction: Cleanse the wound with wound cleanser prior to applying a clean dressing using gauze sponges, not tissue or cotton balls. Peri-Wound Care Topical Gentamicin Discharge Instruction: As directed by physician Mupirocin Ointment Discharge Instruction: Apply Mupirocin (Bactroban) as instructed Primary Dressing Sorbalgon Ag Ribbon, 1x12 (in/in) Discharge Instruction: Lightly pack into undermining and tunneling. Secondary Dressing ABD Pad, 8x10 Discharge Instruction: Apply over primary dressing as directed. Woven Gauze Sponge, Non-Sterile 4x4 in Discharge Instruction: Apply over primary dressing as directed. Secured With Compression Wrap ThreePress (3 layer compression wrap) Discharge Instruction: Apply three layer compression as directed. Compression Stockings Add-Ons Electronic Signature(s) Signed: 07/22/2022 3:48:14 PM By:  Sharyn Creamer RN, BSN Entered By: Sharyn Creamer on 07/22/2022 10:00:29 -------------------------------------------------------------------------------- Vitals Details Patient Name: Date of Service: Donald Monarch LFO RD K. 07/22/2022 9:45 A M Medical Record Number: [id number] Patient Account Number: [id number] Date of Birth/Sex: Treating RN: 03-11-1961 (61 y.o. Donald Butler Primary Care Navada Osterhout: Hassell Done, Donald Other Clinician: Referring Emaleigh Guimond: Treating Jerre Vandrunen/Extender: Cathleen Fears, Donald Butler Vital Signs Time Taken: 09:46 Temperature (F): 99.1 Height (in):  69 Pulse (bpm): 89 Source: Stated Respiratory Rate (breaths/min): 18 Weight (lbs): 240 Blood Pressure (mmHg): 161/95 Source: Stated Reference Range: 80 - 120 mg / dl Body Mass Index (BMI): 35.4 Electronic Signature(s) COLBY, REELS K ([id number]) [id number] Page 10 of 10 Signed: 07/22/2022 3:48:14 PM By: Sharyn Creamer RN, BSN Entered By: Sharyn Creamer on 07/22/2022 09:46:43

## 2022-07-22 NOTE — Progress Notes (Signed)
Donald, Butler ([id number]) [phone] [id number] Page 1 of 4 Visit Report for 07/22/2022 Abuse Risk Screen Details Patient Name: Date of Service: Donald Butler, Donald Butler LFO RD K. 07/22/2022 9:45 A M Medical Record Number: [id number] Patient Account Number: [id number] Date of Birth/Sex: Treating RN: [birth year] (61 y.o. Donald Butler Primary Care Mont Jagoda: Hassell Butler, Donald Other Clinician: Referring Donald Butler: Treating Donald Butler/Extender: Donald Butler, Donald Weeks in Treatment: 0 Abuse Risk Screen Items Answer ABUSE RISK SCREEN: Has anyone close to you tried to hurt or harm you recentlyo No Do you feel uncomfortable with anyone in your familyo No Has anyone forced you do things that you didnt want to doo No Electronic Signature(s) Signed: 07/22/2022 3:48:14 PM By: Donald Creamer RN, BSN Entered By: Donald Butler on 07/22/2022 10:07:36 -------------------------------------------------------------------------------- Activities of Daily Living Details Patient Name: Date of Service: Donald Butler, Donald Butler LFO RD K. 07/22/2022 9:45 A M Medical Record Number: [id number] Patient Account Number: [id number] Date of Birth/Sex: Treating RN: 1960/11/24 (61 y.o. Donald Butler Primary Care Donald Butler: Hassell Butler, Donald Other Clinician: Referring Donald Butler: Treating Donald Butler: Donald Butler, Donald Weeks in Treatment: 0 Activities of Daily Living Items Answer Activities of Daily Living (Please select one for each item) Drive Automobile Completely Able T Medications ake Completely Able Use T elephone Completely Able Care for Appearance Completely Able Use T oilet Completely Able Bath / Shower Completely Able Dress Self Completely Able Feed Self Completely Able Walk Completely Able Get In / Out Bed Completely Able Housework Completely Able Prepare Meals Completely [place] for Self Completely Able Electronic  Signature(s) Signed: 07/22/2022 3:48:14 PM By: Donald Creamer RN, BSN Entered By: Donald Butler on 07/22/2022 10:08:02 Donald Butler ([id number]) [id number] Page 2 of 4 -------------------------------------------------------------------------------- Education Screening Details Patient Name: Date of Service: Donald Butler, Donald Butler LFO RD K. 07/22/2022 9:45 A M Medical Record Number: [id number] Patient Account Number: [id number] Date of Birth/Sex: Treating RN: 30-May-1961 (61 y.o. Donald Butler Primary Care Donald Butler: Hassell Butler, Donald Other Clinician: Referring Donald Butler: Treating Donald Butler/Extender: Donald Butler, Donald Weeks in Treatment: 0 Learning Preferences/Education Level/Primary Language Learning Preference: Explanation, Demonstration, Printed Material Highest Education Level: College or Above Preferred Language: English Cognitive Barrier Language Barrier: No Translator Needed: No Memory Deficit: No Emotional Barrier: No Cultural/Religious Beliefs Affecting Medical Care: No Physical Barrier Impaired Vision: No Impaired Hearing: No Decreased Hand dexterity: No Knowledge/Comprehension Knowledge Level: High Comprehension Level: High Ability to understand written instructions: High Ability to understand verbal instructions: High Motivation Anxiety Level: Calm Cooperation: Cooperative Education Importance: Acknowledges Need Interest in Health Problems: Asks Questions Perception: Coherent Willingness to Engage in Self-Management High Activities: Readiness to Engage in Self-Management High Activities: Electronic Signature(s) Signed: 07/22/2022 3:48:14 PM By: Donald Creamer RN, BSN Entered By: Donald Butler on 07/22/2022 10:09:02 -------------------------------------------------------------------------------- Fall Risk Assessment Details Patient Name: Date of Service: Donald Butler LFO RD K. 07/22/2022 9:45 A M Medical Record Number:  [id number] Patient Account Number: [id number] Date of Birth/Sex: Treating RN: 1961/01/20 (61 y.o. Donald Butler Primary Care Donald Butler: Hassell Butler, Donald Other Clinician: Referring Donald Butler: Treating Donald Butler/Extender: Donald Butler, Donald Weeks in Treatment: 0 Fall Risk Assessment Items Have you had 2 or more falls in the last 12 monthso 0 No Have you had any fall that resulted in injury in the last 12 monthso 0 Yes Butler, Donald K ([id number]) [id number] Page 3 of 4 FALLS RISK SCREEN History of falling - immediate or within 3 months 25 Yes Secondary diagnosis (Do you have 2 or more medical diagnoseso)  15 Yes Ambulatory aid None/bed rest/wheelchair/nurse 0 Yes Crutches/cane/walker 0 No Furniture 0 No Intravenous therapy Access/Saline/Heparin Lock 0 No Gait/Transferring Normal/ bed rest/ wheelchair 0 Yes Weak (short steps with or without shuffle, stooped but able to lift head while walking, may seek 0 No support from furniture) Impaired (short steps with shuffle, may have difficulty arising from chair, head down, impaired 0 No balance) Mental Status Oriented to own ability 0 Yes Electronic Signature(s) Signed: 07/22/2022 3:48:14 PM By: Donald Creamer RN, BSN Entered By: Donald Butler on 07/22/2022 10:10:21 -------------------------------------------------------------------------------- Foot Assessment Details Patient Name: Date of Service: Donald Butler LFO RD K. 07/22/2022 9:45 A M Medical Record Number: [id number] Patient Account Number: [id number] Date of Birth/Sex: Treating RN: 11/18/1960 (61 y.o. Donald Butler Primary Care Aadhya Bustamante: Hassell Butler, Donald Other Clinician: Referring [place] Kretz: Treating Lekendrick Alpern/Extender: Donald Butler, Donald Weeks in Treatment: 0 Foot Assessment Items Site Locations + = Sensation present, - = Sensation absent, C = Callus, U = Ulcer R = Redness, W = Warmth, M =  Maceration, PU = Pre-ulcerative lesion F = Fissure, S = Swelling, D = Dryness Assessment Right: Left: Other Deformity: No No Prior Foot Ulcer: No No Prior Amputation: No No Charcot Joint: No No Ambulatory Status: GaitFREDDERICK, Donald Butler ([id number]) [id number] Page 4 of 4 Electronic Signature(s) Signed: 07/22/2022 3:48:14 PM By: Donald Creamer RN, BSN Entered By: Donald Butler on 07/22/2022 09:54:58 -------------------------------------------------------------------------------- Nutrition Risk Screening Details Patient Name: Date of Service: Donald Butler, Donald Butler LFO RD K. 07/22/2022 9:45 A M Medical Record Number: [id number] Patient Account Number: [id number] Date of Birth/Sex: Treating RN: 10/28/1960 (61 y.o. Donald Butler Primary Care Lincy Belles: Hassell Butler, Donald Other Clinician: Referring Arlicia Paquette: Treating Jimmi Sidener/Extender: Donald Butler, Donald Weeks in Treatment: 0 Height (in): 69 Weight (lbs): 240 Body Mass Index (BMI): 35.4 Nutrition Risk Screening Items Score Screening NUTRITION RISK SCREEN: I have an illness or condition that made me change the kind and/or amount of food I eat 0 No I eat fewer than two meals per day 0 No I eat few fruits and vegetables, or milk products 0 No I have three or more drinks of beer, liquor or wine almost every day 0 No I have tooth or mouth problems that make it hard for me to eat 0 No I don't always have enough money to buy the food I need 0 No I eat alone most of the time 0 No I take three or more different prescribed or over-the-counter drugs a day 1 Yes Without wanting to, I have lost or gained 10 pounds in the last six months 0 No I am not always physically able to shop, cook and/or feed myself 0 No Nutrition Protocols Good Risk Protocol Moderate Risk Protocol High Risk Proctocol Risk Level: Good Risk Score: 1 Electronic Signature(s) Signed: 07/22/2022 3:48:14 PM By: Donald Creamer RN,  BSN Entered By: Donald Butler on 07/22/2022 10:10:40

## 2022-07-22 NOTE — Progress Notes (Signed)
Donald Butler ([id number]) [id number] Page 1 of 8 Visit Report for 07/22/2022 Chief Complaint Document Details Patient Name: Date of Service: [name], [name] 07/22/2022 9:45 A M Medical Record Number: [id number] Patient Account Number: [id number] Date of Birth/Sex: Treating RN: 1961/07/31 (61 y.o. M) Primary Care Provider: Hassell Done, Butler Other Clinician: Referring Provider: Treating Provider/Extender: Donald Butler Weeks in Treatment: 0 Information Obtained from: Patient Chief Complaint 07/22/2022; right lower extremity wound Electronic Signature(s) Signed: 07/22/2022 11:32:14 AM By: Donald Shan DO Entered By: Donald Butler on 07/22/2022 10:38:38 -------------------------------------------------------------------------------- HPI Details Patient Name: Date of Service: Donald Butler LFO RD K. 07/22/2022 9:45 A M Medical Record Number: [id number] Patient Account Number: [id number] Date of Birth/Sex: Treating RN: 08-09-1961 (61 y.o. M) Primary Care Provider: Hassell Done, Butler Other Clinician: Referring Provider: Treating Provider/Extender: Donald Butler Weeks in Treatment: 0 History of Present Illness HPI Description: 07/22/2022 Donald Butler is a 61 year old male with a past medical history of venous insufficiency, hypertension and hyperlipidemia that presents to the clinic for a 25- day history of nonhealing ulcer to the right lower extremity. He states that he fell down his porch steps and developed a hematoma. He subsequently developed right lower extremity cellulitis and completed a 10-day course of Bactrim and received an injection of Rocephin. The hematoma was eventually evacuated by his primary care physician. Has been packing the area with iodoform once weekly at his primary care physician's office. Since having the area evacuated his symptoms of increased swelling, erythema and pain have  subsided. He currently denies signs of infection. Electronic Signature(s) Signed: 07/22/2022 11:32:14 AM By: Donald Shan DO Entered By: Donald Butler on 07/22/2022 10:42:16 -------------------------------------------------------------------------------- Physical Exam Details Patient Name: Date of Service: Donald Butler LFO RD K. 07/22/2022 9:45 A M Medical Record Number: [id number] Patient Account Number: [id number] Date of Birth/Sex: Treating RN: 06-02-1961 (61 y.o. M) Primary Care Provider: Hassell Done Butler Other Clinician: HAPPY, Butler ([id number]) [id number] Page 2 of 8 Referring Provider: Treating Provider/Extender: Donald Butler Weeks in Treatment: 0 Constitutional respirations regular, non-labored and within target range for patient.. Cardiovascular 2+ dorsalis pedis/posterior tibialis pulses. Psychiatric pleasant and cooperative. Notes Right lower extremity: 2+ pitting edema to the knee. Lateral aspect there is an open wound with granulation tissue at the opening and undermining. No obvious signs of infection including increased warmth, erythema or purulent drainage. Venous stasis dermatitis Electronic Signature(s) Signed: 07/22/2022 11:32:14 AM By: Donald Shan DO Entered By: Donald Butler on 07/22/2022 10:46:25 -------------------------------------------------------------------------------- Physician Orders Details Patient Name: Date of Service: Donald Butler LFO RD K. 07/22/2022 9:45 A M Medical Record Number: [id number] Patient Account Number: [id number] Date of Birth/Sex: Treating RN: [birth year] (61 y.o. Donald Butler Primary Care Provider: Hassell Done, Butler Other Clinician: Referring Provider: Treating Provider/Extender: Donald Butler Weeks in Treatment: 0 Verbal / Phone Orders: No Diagnosis Coding ICD-10 Coding Code Description [phone] Non-pressure chronic ulcer of other  part of right lower leg with fat layer exposed T79.8XXA Other early complications of trauma, initial encounter I10 Essential (primary) hypertension I87.311 Chronic venous hypertension (idiopathic) with ulcer of right lower extremity Follow-up Appointments ppointment in 1 week. - Dr. Heber Sylvan Butler Return A ppointment in 2 weeks. - Dr. Heber Holiday Butler Return A Anesthetic (In clinic) Topical Lidocaine 4% applied to wound bed Bathing/ Shower/ Hygiene May shower with protection but do not get wound dressing(s) wet. Edema Control - Lymphedema / SCD / Other Elevate legs to the level of the heart or above for 30 minutes daily  and/or when sitting, a frequency of: - 3-4 times a day throughout the day. Avoid standing for long periods of time. Exercise regularly Wound Treatment Wound #1 - Lower Leg Wound Laterality: Right, Lateral Cleanser: Soap and Water 1 x Per Week/30 Days Discharge Instructions: May shower and wash wound with dial antibacterial soap and water prior to dressing change. Cleanser: Wound Cleanser 1 x Per Week/30 Days Discharge Instructions: Cleanse the wound with wound cleanser prior to applying a clean dressing using gauze sponges, not tissue or cotton balls. Topical: Gentamicin 1 x Per Week/30 Days Discharge Instructions: As directed by physician Topical: Mupirocin Ointment 1 x Per Week/30 Days Donald Butler, Donald Butler ([id number]) [id number] Page 3 of 8 Discharge Instructions: Apply Mupirocin (Bactroban) as instructed Prim Dressing: Sorbalgon Ag Ribbon, 1x12 (in/in) 1 x Per Week/30 Days ary Discharge Instructions: Lightly pack into undermining and tunneling. Secondary Dressing: ABD Pad, 8x10 1 x Per Week/30 Days Discharge Instructions: Apply over primary dressing as directed. Secondary Dressing: Woven Gauze Sponge, Non-Sterile 4x4 in 1 x Per Week/30 Days Discharge Instructions: Apply over primary dressing as directed. Compression Wrap: ThreePress (3 layer compression  wrap) 1 x Per Week/30 Days Discharge Instructions: Apply three layer compression as directed. Patient Medications llergies: penicillin A Notifications Medication Indication Start End 07/22/2022 lidocaine DOSE topical 4 % gel - gel topical applied only in clinic for and debridements. Electronic Signature(s) Signed: 07/22/2022 11:32:14 AM By: Donald Shan DO Entered By: Donald Butler on 07/22/2022 10:46:35 -------------------------------------------------------------------------------- Problem List Details Patient Name: Date of Service: Donald Butler LFO RD K. 07/22/2022 9:45 A M Medical Record Number: [id number] Patient Account Number: [id number] Date of Birth/Sex: Treating RN: 1961-02-24 (61 y.o. M) Primary Care Provider: Hassell Done, Butler Other Clinician: Referring Provider: Treating Provider/Extender: Donald Butler Weeks in Treatment: 0 Active Problems ICD-10 Encounter Code Description Active Date MDM Diagnosis L97.812 Non-pressure chronic ulcer of other part of right lower leg with fat layer 07/22/2022 No Yes exposed T79.8XXA Other early complications of trauma, initial encounter 07/22/2022 No Yes I87.311 Chronic venous hypertension (idiopathic) with ulcer of right lower extremity 07/22/2022 No Yes I10 Essential (primary) hypertension 07/22/2022 No Yes Inactive Problems Resolved Problems Electronic Signature(s) Donald Butler, Donald Butler ([id number]) [id number] Page 4 of 8 Signed: 07/22/2022 11:32:14 AM By: Donald Shan DO Entered By: Donald Butler on 07/22/2022 10:38:15 -------------------------------------------------------------------------------- Progress Note Details Patient Name: Date of Service: Donald Butler LFO RD K. 07/22/2022 9:45 A M Medical Record Number: [id number] Patient Account Number: [id number] Date of Birth/Sex: Treating RN: [birth year] (61 y.o. M) Primary Care Provider: Hassell Done, Butler Other  Clinician: Referring Provider: Treating Provider/Extender: Donald Butler Weeks in Treatment: 0 Subjective Chief Complaint Information obtained from Patient 07/22/2022; right lower extremity wound History of Present Illness (HPI) 07/22/2022 Donald Butler is a 61 year old male with a past medical history of venous insufficiency, hypertension and hyperlipidemia that presents to the clinic for a 58- day history of nonhealing ulcer to the right lower extremity. He states that he fell down his porch steps and developed a hematoma. He subsequently developed right lower extremity cellulitis and completed a 10-day course of Bactrim and received an injection of Rocephin. The hematoma was eventually evacuated by his primary care physician. Has been packing the area with iodoform once weekly at his primary care physician's office. Since having the area evacuated his symptoms of increased swelling, erythema and pain have subsided. He currently denies signs of infection. Patient History Allergies penicillin (Reaction: hives, SHOB) Family History Diabetes - Father. Social History Never smoker,  Marital Status - Married, Alcohol Use - Daily - few drinks a day, Drug Use - No History, Caffeine Use - Daily. Medical History Cardiovascular Patient has history of Arrhythmia - A-fib, Coronary Artery Disease, Hypertension Musculoskeletal Patient has history of Gout Hospitalization/Surgery History - right mastoidectomy 2013. - 2009 left leg reconstruction. - 1970 right shoulder reconstruction. - back surgery [id number]. - arthroscopy x2 2006 2008 left. 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 Eyes Complains or has symptoms of Dry Eyes, Glasses / Contacts - glasses. Ear/Nose/Mouth/Throat Denies complaints or symptoms of Chronic sinus problems or rhinitis. Respiratory Denies complaints or symptoms of Chronic or frequent coughs, Shortness of Breath. Gastrointestinal Denies complaints or symptoms of Frequent diarrhea, Nausea, Vomiting. Endocrine Denies complaints or symptoms of Heat/cold intolerance. Genitourinary Denies complaints or symptoms of Frequent urination. Integumentary (Skin) Complains or has symptoms of Wounds - right lower leg. Musculoskeletal Complains or has symptoms of Muscle Weakness. Donald Butler, Donald Butler ([id number]) [id number] Page 5 of 8 Neurologic Denies complaints or symptoms of Numbness/parasthesias. Psychiatric Denies complaints or symptoms of Claustrophobia. Objective Constitutional respirations regular, non-labored and within target range for patient.. Vitals Time Taken: 9:46 AM, Height: 69 in, Source: Stated, Weight: 240 lbs, Source: Stated, BMI: 35.4, Temperature: 99.1 F, Pulse: 89 bpm, Respiratory Rate: 18 breaths/min, Blood Pressure: 161/95 mmHg. Cardiovascular 2+ dorsalis pedis/posterior tibialis pulses. Psychiatric pleasant and cooperative. General Notes: Right lower extremity: 2+ pitting edema to the knee. Lateral aspect there is an open wound with granulation tissue at the opening and undermining. No obvious signs of infection including increased warmth, erythema or purulent drainage. Venous stasis dermatitis Integumentary (Hair, Skin) Wound #1 status is Open. Original cause of wound was Trauma. The date acquired was: 06/11/2022. The wound is located on the Right,Lateral Lower Leg. The wound measures 0.9cm length x 1.1cm width x 1.3cm depth; 0.778cm^2 area and 1.011cm^3 volume. There is Fat Layer (Subcutaneous Tissue)  exposed. There is no tunneling noted, however, there is undermining starting at 10:00 and ending at 3:00 with a maximum distance of 2.8cm. There is a medium amount of serosanguineous drainage noted. There is large (67-100%) red granulation within the wound bed. There is a small (1-33%) amount of necrotic tissue within the wound bed including Adherent Slough. The periwound skin appearance exhibited: Maceration. Assessment Active Problems ICD-10 Non-pressure chronic ulcer of other part of right lower leg with fat layer exposed Other early complications of trauma, initial encounter Chronic venous hypertension (idiopathic) with ulcer of right lower extremity Essential (primary) hypertension Patient presents with a 1 to 4-monthhistory of nonhealing ulcer to the right lower extremity secondary to trauma complicated by hematoma and chronic venous insufficiency. We discussed the importance of swelling control for his wound healing. ABI in office was 1.15 on the right. He is strong pedal pulses. He has adequate blood flow for healing. At this time I recommended antibiotic ointment to address any bioburden along with silver alginate under compression therapy. He knows not to get the wrap wet or keep this on for more than 1 day. I recommended a cast protector bag if he would like to shower. Follow-up in 1 week. Plan Follow-up Appointments: Return Appointment in 1 week. - Dr. HHeber [place] App[place]ment in 2 weeks. - Dr. HHeber [place]  Anesthetic: (In clinic) Topical Lidocaine 4% applied to wound bed Bathing/ Shower/ Hygiene: May shower with protection but do not get wound dressing(s) wet. Edema Control - Lymphedema / SCD / Other: Elevate legs to the level of the heart or above for 30 minutes daily and/or when sitting, a frequency of: - 3-4 times a day throughout the day. Avoid standing for long periods of time. Exercise regularly The following medication(s) was prescribed: lidocaine topical 4 % gel gel  topical applied only in clinic for and debridements. was prescribed at facility WOUND #1: - Lower Leg Wound Laterality: Right, Lateral Cleanser: Soap and Water 1 x Per Week/30 Days Discharge Instructions: May shower and wash wound with dial antibacterial soap and water prior to dressing change. Cleanser: Wound Cleanser 1 x Per Week/30 Days Discharge Instructions: Cleanse the wound with wound cleanser prior to applying a clean dressing using gauze sponges, not tissue or cotton balls. Topical: Gentamicin 1 x Per Week/30 Days Discharge Instructions: As directed by physician Topical: Mupirocin Ointment 1 x Per Week/30 Days Discharge Instructions: Apply Mupirocin (Bactroban) as instructed Donald Butler, Donald Butler ([id number]) [id number] Page 6 of 8 Prim Dressing: Sorbalgon Ag Ribbon, 1x12 (in/in) 1 x Per Week/30 Days ary Discharge Instructions: Lightly pack into undermining and tunneling. Secondary Dressing: ABD Pad, 8x10 1 x Per Week/30 Days Discharge Instructions: Apply over primary dressing as directed. Secondary Dressing: Woven Gauze Sponge, Non-Sterile 4x4 in 1 x Per Week/30 Days Discharge Instructions: Apply over primary dressing as directed. Com pression Wrap: ThreePress (3 layer compression wrap) 1 x Per Week/30 Days Discharge Instructions: Apply three layer compression as directed. 1. Silver alginate with antibiotic ointment under 3 layer compression 2. Follow-up in 1 week Electronic Signature(s) Signed: 07/22/2022 11:32:14 AM By: Donald Shan DO Entered By: Donald Butler on 07/22/2022 10:48:39 -------------------------------------------------------------------------------- HxROS Details Patient Name: Date of Service: Donald Butler LFO RD K. 07/22/2022 9:45 A M Medical Record Number: [id number] Patient Account Number: [id number] Date of Birth/Sex: Treating RN: 07-May-1961 (61 y.o. 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) Eyes Complaints and Symptoms: Positive for: Dry Eyes; Glasses / Contacts - glasses Ear/Nose/Mouth/Throat Complaints and Symptoms: Negative for: Chronic sinus problems or rhinitis Respiratory Complaints and Symptoms: Negative for: Chronic or frequent coughs; Shortness of Breath Gastrointestinal Complaints and Symptoms: Negative for: Frequent diarrhea; Nausea; Vomiting Medical History: Past Medical History Notes: acid reflux Endocrine Complaints and Symptoms: Negative for: Heat/cold intolerance Genitourinary Complaints and Symptoms: Negative for: Frequent urination Medical HistoryRUBEN, Donald Butler ([id number]) [id number] Page 7 of 8 Past Medical History Notes: internial hemrrhoid Integumentary (Skin) Complaints and Symptoms: Positive for: Wounds - right lower leg Musculoskeletal Complaints and Symptoms: Positive for: Muscle Weakness Medical History: Positive for: Gout Neurologic Complaints and Symptoms: Negative for: Numbness/parasthesias Medical History: Past Medical History Notes: left leg perineal nerve damage Psychiatric Complaints and Symptoms: Negative for: Claustrophobia Hematologic/Lymphatic Cardiovascular Medical History: Positive for: Arrhythmia - A-fib; Coronary Artery Disease; Hypertension Past Medical History Notes: lyperlipidemia Immunological Oncologic Immunizations Pneumococcal Vaccine: Received Pneumococcal Vaccination: No Implantable Devices None Hospitalization / Surgery History Type of Hospitalization/Surgery right mastoidectomy 2013 2009 left leg  reconstruction 1970 right shoulder reconstruction back surgery [id number] arthroscopy x2 2006 2008 left Family and Social History Diabetes: Yes - Father; Never smoker; Marital  Status - Married; Alcohol Use: Daily - few drinks a day; Drug Use: No History; Caffeine Use: Daily; Financial Concerns: No; Food, Clothing or Shelter Needs: No; Support System Lacking: No; Transportation Concerns: No Electronic Signature(s) Signed: 07/22/2022 11:32:14 AM By: Donald Shan DO Signed: 07/22/2022 3:03:04 PM By: Deon Pilling RN, BSN Signed: 07/22/2022 3:48:14 PM By: Sharyn Creamer RN, BSN Entered By: Sharyn Creamer on 07/22/2022 10:07:25 Donald Butler ([id number]) [id number] Page 8 of 8 -------------------------------------------------------------------------------- SuperBill Details Patient Name: Date of Service: Donald Butler, Donald Butler LFO RD K. 07/22/2022 Medical Record Number: [id number] Patient Account Number: [id number] Date of Birth/Sex: Treating RN: [birth year] (61 y.o. Donald Butler Primary Care Provider: Hassell Done, Butler Other Clinician: Referring Provider: Treating Provider/Extender: Donald Butler Weeks in Treatment: 0 Diagnosis Coding ICD-10 Codes Code Description [id number] Non-pressure chronic ulcer of other part of right lower leg with fat layer exposed T79.8XXA Other early complications of trauma, initial encounter I10 Essential (primary) hypertension I87.311 Chronic venous hypertension (idiopathic) with ulcer of right lower extremity Facility Procedures : CPT4 Code: [id number] Description: 99214 - WOUND CARE VISIT-LEV 4 EST PT Modifier: Quantity: 1 Physician Procedures : CPT4 Code Description Modifier [id number] - WC PHYS LEVEL 4 - NEW PT ICD-10 Diagnosis Description L97.812 Non-pressure chronic ulcer of other part of right lower leg with fat layer exposed T79.8XXA Other early complications of trauma, initial  encounter I87.311  Chronic venous hypertension (idiopathic) with ulcer of right lower extremity I10 Essential (primary) hypertension Quantity: 1 Electronic Signature(s) Signed: 07/22/2022 11:32:14 AM By: Donald Shan DO Entered By: Donald Butler on 07/22/2022 10:48:51

## 2022-07-31 ENCOUNTER — Encounter (HOSPITAL_BASED_OUTPATIENT_CLINIC_OR_DEPARTMENT_OTHER): Payer: Medicare Other | Admitting: Internal Medicine

## 2022-07-31 DIAGNOSIS — I10 Essential (primary) hypertension: Secondary | ICD-10-CM

## 2022-07-31 DIAGNOSIS — I87311 Chronic venous hypertension (idiopathic) with ulcer of right lower extremity: Secondary | ICD-10-CM

## 2022-07-31 DIAGNOSIS — E785 Hyperlipidemia, unspecified: Secondary | ICD-10-CM | POA: Diagnosis not present

## 2022-07-31 DIAGNOSIS — T798XXA Other early complications of trauma, initial encounter: Secondary | ICD-10-CM

## 2022-07-31 DIAGNOSIS — I2510 Atherosclerotic heart disease of native coronary artery without angina pectoris: Secondary | ICD-10-CM | POA: Diagnosis not present

## 2022-07-31 DIAGNOSIS — I4891 Unspecified atrial fibrillation: Secondary | ICD-10-CM | POA: Diagnosis not present

## 2022-07-31 DIAGNOSIS — L97812 Non-pressure chronic ulcer of other part of right lower leg with fat layer exposed: Secondary | ICD-10-CM

## 2022-07-31 DIAGNOSIS — I872 Venous insufficiency (chronic) (peripheral): Secondary | ICD-10-CM | POA: Diagnosis not present

## 2022-07-31 NOTE — Progress Notes (Signed)
Donald Butler, Donald Butler ([id number]) [phone].pdf Page 1 of 7 Visit Report for 07/31/2022 Chief Complaint Document Details Patient Name: Date of Service: Donald Butler, Donald LFO RD K. 07/31/2022 1:45 PM Medical Record Number: [id number] Patient Account Number: [id number] Date of Birth/Sex: Treating RN: December 09, 1960 (61 y.o. M) Primary Care Provider: Hassell Done, [name] Other Clinician: Referring Provider: Treating Provider/Extender: Cathleen Fears, [name] Weeks in Treatment: 1 Information Obtained from: Patient Chief Complaint 07/22/2022; right lower extremity wound Electronic Signature(s) Signed: 07/31/2022 5:16:42 PM By: Kalman Shan DO Entered By: Kalman Shan on 07/31/2022 14:42:34 -------------------------------------------------------------------------------- HPI Details Patient Name: Date of Service: Donald Monarch LFO RD K. 07/31/2022 1:45 PM Medical Record Number: [id number] Patient Account Number: [id number] Date of Birth/Sex: Treating RN: 07/21/1961 (61 y.o. M) Primary Care Provider: Hassell Done, [name] Other Clinician: Referring Provider: Treating Provider/Extender: Cathleen Fears, [name] Weeks in Treatment: 1 History of Present Illness HPI Description: 07/22/2022 Donald Butler is a 61 year old male with a past medical history of venous insufficiency, hypertension and hyperlipidemia that presents to the clinic for a 58- day history of nonhealing ulcer to the right lower extremity. He states that he fell down his porch steps and developed a hematoma. He subsequently developed right lower extremity cellulitis and completed a 10-day course of Bactrim and received an injection of Rocephin. The hematoma was eventually evacuated by his primary care physician. Has been packing the area with iodoform once weekly at his primary care physician's office. Since having the area evacuated his symptoms of increased swelling, erythema and pain have  subsided. He currently denies signs of infection. 12/14; patient presents for follow-up. We have been using silver alginate with antibiotic ointment under 3 layer compression. He tolerated the wrap well. He was hoping to be able to use his work boots and states that the wrap is too thick. We will switch to a 2 layer Coflex to see if this helps. Electronic Signature(s) Signed: 07/31/2022 5:16:42 PM By: Kalman Shan DO Entered By: Kalman Shan on 07/31/2022 14:43:34 -------------------------------------------------------------------------------- Physical Exam Details Patient Name: Date of Service: Donald Monarch LFO RD K. 07/31/2022 1:45 PM Medical Record Number: [id number] Patient Account Number: [id number] Donald Butler, Donald Butler ([id number]) [phone].pdf Page 2 of 7 Date of Birth/Sex: Treating RN: 1960-09-13 (61 y.o. M) Primary Care Provider: Other Clinician: Hassell Done, [name] Referring Provider: Treating Provider/Extender: Cathleen Fears, [name] Weeks in Treatment: 1 Constitutional respirations regular, non-labored and within target range for patient.. Cardiovascular 2+ dorsalis pedis/posterior tibialis pulses. Psychiatric pleasant and cooperative. Notes Right lower extremity: 2+ pitting edema to the knee. Lateral aspect there is an open wound with granulation tissue at the opening and undermining. No obvious signs of infection including increased warmth, erythema or purulent drainage. Venous stasis dermatitis Electronic Signature(s) Signed: 07/31/2022 5:16:42 PM By: Kalman Shan DO Entered By: Kalman Shan on 07/31/2022 14:44:06 -------------------------------------------------------------------------------- Physician Orders Details Patient Name: Date of Service: Donald Monarch LFO RD K. 07/31/2022 1:45 PM Medical Record Number: [id number] Patient Account Number: [id number] Date of Birth/Sex: Treating RN: 1961-01-27 (61 y.o. [name],  Lauren Primary Care Provider: Hassell Done, [name] Other Clinician: Referring Provider: Treating Provider/Extender: Cathleen Fears, [name] Weeks in Treatment: 1 Verbal / Phone Orders: No Diagnosis Coding Follow-up Appointments ppointment in 1 week. - Dr. Heber Parks Return A ppointment in 2 weeks. - Dr. Heber [place] Return A Anesthetic (In clinic) Topical Lidocaine 4% applied to wound bed Bathing/ Shower/ Hygiene May shower with protection but do not get wound dressing(s) wet. Edema Control - Lymphedema / SCD / Other Elevate legs to the level  of the heart or above for 30 minutes daily and/or when sitting, a frequency of: - 3-4 times a day throughout the day. Avoid standing for long periods of time. Exercise regularly Wound Treatment Wound #1 - Lower Leg Wound Laterality: Right, Lateral Cleanser: Soap and Water 1 x Per Week/30 Days Discharge Instructions: May shower and wash wound with dial antibacterial soap and water prior to dressing change. Cleanser: Wound Cleanser 1 x Per Week/30 Days Discharge Instructions: Cleanse the wound with wound cleanser prior to applying a clean dressing using gauze sponges, not tissue or cotton balls. Topical: Gentamicin 1 x Per Week/30 Days Discharge Instructions: As directed by physician Topical: Mupirocin Ointment 1 x Per Week/30 Days Discharge Instructions: Apply Mupirocin (Bactroban) as instructed Prim Dressing: Sorbalgon Ag Ribbon, 1x12 (in/in) 1 x Per Week/30 Days ary Discharge Instructions: Lightly pack into undermining and tunneling. Secondary Dressing: ABD Pad, 8x10 1 x Per Week/30 Days Discharge Instructions: Apply over primary dressing as directed. Donald Butler, Donald Butler ([id number]) [phone].pdf Page 3 of 7 Secondary Dressing: Woven Gauze Sponge, Non-Sterile 4x4 in 1 x Per Week/30 Days Discharge Instructions: Apply over primary dressing as directed. Compression Wrap: Kerlix Roll 4.5x3.1 (in/yd) 1 x Per  Week/30 Days Discharge Instructions: Apply Kerlix and Coban compression as directed. Compression Wrap: Coban Self-Adherent Wrap 4x5 (in/yd) 1 x Per Week/30 Days Discharge Instructions: Apply over Kerlix as directed. Electronic Signature(s) Signed: 07/31/2022 5:16:42 PM By: Kalman Shan DO Entered By: Kalman Shan on 07/31/2022 14:44:16 -------------------------------------------------------------------------------- Problem List Details Patient Name: Date of Service: Donald Monarch LFO RD K. 07/31/2022 1:45 PM Medical Record Number: [id number] Patient Account Number: [id number] Date of Birth/Sex: Treating RN: 1961-02-20 (61 y.o. M) Primary Care Provider: Hassell Done, [name] Other Clinician: Referring Provider: Treating Provider/Extender: Cathleen Fears, [name] Weeks in Treatment: 1 Active Problems ICD-10 Encounter Code Description Active Date MDM Diagnosis L97.812 Non-pressure chronic ulcer of other part of right lower leg with fat layer 07/22/2022 No Yes exposed T79.8XXA Other early complications of trauma, initial encounter 07/22/2022 No Yes I87.311 Chronic venous hypertension (idiopathic) with ulcer of right lower extremity 07/22/2022 No Yes I10 Essential (primary) hypertension 07/22/2022 No Yes Inactive Problems Resolved Problems Electronic Signature(s) Signed: 07/31/2022 5:16:42 PM By: Kalman Shan DO Entered By: Kalman Shan on 07/31/2022 14:42:18 Progress Note Details -------------------------------------------------------------------------------- Donald Butler ([id number]) [id number] Page 4 of 7 Patient Name: Date of Service: Donald Butler, Donald LFO RD K. 07/31/2022 1:45 PM Medical Record Number: [id number] Patient Account Number: [id number] Date of Birth/Sex: Treating RN: 1961/06/10 (61 y.o. M) Primary Care Provider: Hassell Done, [name] Other Clinician: Referring Provider: Treating Provider/Extender: Cathleen Fears,  [name] Weeks in Treatment: 1 Subjective Chief Complaint Information obtained from Patient 07/22/2022; right lower extremity wound History of Present Illness (HPI) 07/22/2022 Donald Butler is a 61 year old male with a past medical history of venous insufficiency, hypertension and hyperlipidemia that presents to the clinic for a 30- day history of nonhealing ulcer to the right lower extremity. He states that he fell down his porch steps and developed a hematoma. He subsequently developed right lower extremity cellulitis and completed a 10-day course of Bactrim and received an injection of Rocephin. The hematoma was eventually evacuated by his primary care physician. Has been packing the area with iodoform once weekly at his primary care physician's office. Since having the area evacuated his symptoms of increased swelling, erythema and pain have subsided. He currently denies signs of infection. 12/14; patient presents for follow-up. We have been using silver alginate with antibiotic ointment under 3 layer  compression. He tolerated the wrap well. He was hoping to be able to use his work boots and states that the wrap is too thick. We will switch to a 2 layer Coflex to see if this helps. Patient History Family History Diabetes - Father. Social History Never smoker, Marital Status - Married, Alcohol Use - Daily - few drinks a day, Drug Use - No History, Caffeine Use - Daily. Medical History Cardiovascular Patient has history of Arrhythmia - A-fib, Coronary Artery Disease, Hypertension Musculoskeletal Patient has history of Gout Hospitalization/Surgery History - right mastoidectomy 2013. - 2009 left leg reconstruction. - 1970 right shoulder reconstruction. - back surgery [id number]. - arthroscopy x2 2006 2008 left. 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 Vitals Time Taken: 2:06 PM, Height: 69 in, Weight: 240 lbs, BMI: 35.4, Temperature: 99.1 F, Pulse: 94 bpm, Respiratory Rate: 20 breaths/min, Blood Pressure: 161/97 mmHg. Cardiovascular 2+ dorsalis pedis/posterior tibialis pulses. Psychiatric pleasant and cooperative. General Notes: Right lower extremity: 2+ pitting edema to the knee. Lateral aspect there is an open wound with granulation tissue at the opening and undermining. No obvious signs of infection including increased warmth, erythema or purulent drainage. Venous stasis dermatitis Integumentary (Hair, Skin) Wound #1 status is Open. Original cause of wound was Trauma. The date acquired was: 06/11/2022. The wound has been in treatment 1 weeks. The wound is located on the Right,Lateral Lower Leg. The wound measures 0.9cm length x 1.1cm width x 0.9cm depth; 0.778cm^2 area and 0.7cm^3 volume. There is Fat Layer (Subcutaneous Tissue) exposed. There is no tunneling noted, however, there is undermining starting at 9:00 and ending at 10:00 with a maximum distance of 1.4cm. There is a medium amount of serosanguineous drainage noted. The wound margin is distinct with the outline attached to the wound base. Donald Butler, Donald Butler ([id number]) [id number].pdf Page 5 of 7 There is large (67-100%) red granulation within the wound bed. There is no necrotic tissue within the wound bed. The periwound skin appearance did not exhibit: Callus, Crepitus, Excoriation, Induration, Rash, Scarring, Dry/Scaly, Maceration, Atrophie Blanche, Cyanosis, Ecchymosis, Hemosiderin Staining, Mottled, Pallor, Rubor, Erythema. Assessment Active Problems ICD-10 Non-pressure chronic ulcer of other part of right  lower leg with fat layer exposed Other early complications of trauma, initial encounter Chronic venous hypertension (idiopathic) with ulcer of right lower extremity Essential (primary) hypertension Patient's wound has shown improvement in size and appearance since last clinic visit. I recommended continuing the course with silver alginate and antibiotic ointment under compression therapy. Follow-up in 1 week. Plan Follow-up Appointments: Return Appointment in 1 week. - Dr. Heber [place] Return Appointment in 2 weeks. - Dr. Heber Aguilar Anesthetic: (In clinic) Topical Lidocaine 4% applied to wound bed Bathing/ Shower/ Hygiene: May shower with protection but do not get wound dressing(s) wet. Edema Control - Lymphedema / SCD / Other: Elevate legs to the level of the heart or above for 30 minutes daily and/or when sitting, a frequency of: - 3-4 times a day throughout the day. Avoid standing for long periods of time. Exercise regularly WOUND #1: - Lower Leg Wound Laterality: Right, Lateral Cleanser: Soap and Water 1 x Per Week/30 Days Discharge Instructions: May shower and wash wound with dial antibacterial soap and water prior to dressing change. Cleanser: Wound Cleanser 1 x Per Week/30 Days Discharge Instructions: Cleanse  the wound with wound cleanser prior to applying a clean dressing using gauze sponges, not tissue or cotton balls. Topical: Gentamicin 1 x Per Week/30 Days Discharge Instructions: As directed by physician Topical: Mupirocin Ointment 1 x Per Week/30 Days Discharge Instructions: Apply Mupirocin (Bactroban) as instructed Prim Dressing: Sorbalgon Ag Ribbon, 1x12 (in/in) 1 x Per Week/30 Days ary Discharge Instructions: Lightly pack into undermining and tunneling. Secondary Dressing: ABD Pad, 8x10 1 x Per Week/30 Days Discharge Instructions: Apply over primary dressing as directed. Secondary Dressing: Woven Gauze Sponge, Non-Sterile 4x4 in 1 x Per Week/30 Days Discharge Instructions:  Apply over primary dressing as directed. Com pression Wrap: Kerlix Roll 4.5x3.1 (in/yd) 1 x Per Week/30 Days Discharge Instructions: Apply Kerlix and Coban compression as directed. Com pression Wrap: Coban Self-Adherent Wrap 4x5 (in/yd) 1 x Per Week/30 Days Discharge Instructions: Apply over Kerlix as directed. 1. Silver alginate with antibiotic ointment under 2 layer Coflex 2. Follow-up in 1 week Electronic Signature(s) Signed: 07/31/2022 5:16:42 PM By: Kalman Shan DO Entered By: Kalman Shan on 07/31/2022 14:45:47 -------------------------------------------------------------------------------- HxROS Details Patient Name: Date of Service: Donald Monarch LFO RD K. 07/31/2022 1:45 PM Medical Record Number: [id number] Patient Account Number: [id number] Date of Birth/Sex: Treating RN: 02-Feb-1961 (61 y.o. 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) Cardiovascular Medical History: Positive for: Arrhythmia - A-fib; Coronary Artery Disease; Hypertension Past Medical History Notes: lyperlipidemia Gastrointestinal Medical History: Past Medical History Notes: acid reflux Genitourinary Medical History: Past Medical History Notes: internial hemrrhoid Musculoskeletal Medical History: Positive for: Gout Neurologic Medical History: Past Medical History Notes: left leg perineal nerve damage Immunizations Pneumococcal Vaccine: Received Pneumococcal Vaccination: No Implantable Devices None Hospitalization / Surgery History Type of Hospitalization/Surgery right mastoidectomy 2013 2009 left leg reconstruction 1970 right shoulder  reconstruction back surgery 6553 Donald Butler arthroscopy x2 2006 2008 left Family and Social History Diabetes: Yes - Father; Never smoker; Marital Status - Married; Alcohol Use: Daily - few drinks a day; Drug Use: No History; Caffeine Use: Daily; Financial Concerns: No; Food, Clothing or Shelter Needs: No; Support System Lacking: No; Transportation Concerns: No Electronic Signature(s) Signed: 07/31/2022 5:16:42 PM By: Kalman Shan DO Entered By: Kalman Shan on 07/31/2022 14:43:42 -------------------------------------------------------------------------------- SuperBill Details Patient Name: Date of Service: Donald Monarch LFO RD K. 07/31/2022 Medical Record Number: [id number] Patient Account Number: [id number] Date of Birth/Sex: Treating RN: 1961-02-28 (62 y.o. [place] Noe Primary Care Provider: Hassell Done [name] Other Clinician: ALDER, MURRI ([id number]) [id number] Page 7 of 7 Referring Provider: Treating Provider/Extender: Cathleen Fears, [name] Weeks in Treatment: 1 Diagnosis Coding ICD-10 Codes Code Description [phone] Non-pressure chronic ulcer of other part of right lower leg with fat layer exposed T79.8XXA Other early complications of trauma, initial encounter I10 Essential (primary) hypertension I87.311 Chronic venous hypertension (idiopathic) with ulcer of right lower extremity Facility Procedures : CPT4 Code: [id number] Description: 99213 - WOUND CARE VISIT-LEV 3 EST PT Modifier: Quantity: 1 Physician Procedures : CPT4 Code Description Modifier [id number] - WC PHYS LEVEL 3 - EST PT ICD-10 Diagnosis Description L97.812 Non-pressure chronic ulcer of other part of right lower leg with fat layer exposed T79.8XXA Other early complications of trauma, initial  encounter I10 Essential (primary) hypertension I87.311 Chronic venous hypertension (idiopathic) with ulcer of right lower extremity Quantity: 1 Electronic  Signature(s)  Signed: 07/31/2022 5:16:42 PM By: Kalman Shan DO Entered By: Kalman Shan on 07/31/2022 14:46:25

## 2022-07-31 NOTE — Progress Notes (Signed)
[name], [name] ([id number]) [id number] Page 1 of 10 Visit Report for 07/31/2022 Arrival Information Details Patient Name: Date of Service: Donald Butler, Donald LFO RD Butler. 07/31/2022 1:45 PM Medical Record Number: [id number] Patient Account Number: [id number] Date of Birth/Sex: Treating RN: 1960-10-11 (61 y.o. Donald Butler Primary Care Wenzel Backlund: Hassell Done, Donald Butler Other Clinician: Referring Gabrielle Wakeland: Treating Petro Talent/Extender: Cathleen Fears, Donald Butler Weeks in Treatment: 1 Visit Information History Since Last Visit Added or deleted any medications: No Patient Arrived: Ambulatory Any new allergies or adverse reactions: No Arrival Time: 14:06 Had Donald fall or experienced change in No Accompanied By: self activities of daily living that may affect Transfer Assistance: None risk of falls: Patient Identification Verified: Yes Signs or symptoms of abuse/neglect since last visito No Secondary Verification Process Completed: Yes Hospitalized since last visit: No Patient Requires Transmission-Based Precautions: No Implantable device outside of the clinic excluding No Patient Has Alerts: Yes cellular tissue based products placed in the center Patient Alerts: Patient on Blood Thinner since last visit: Aspirin 52m Has Dressing in Place as Prescribed: Yes Has Compression in Place as Prescribed: Yes Pain Present Now: No Electronic Signature(s) Signed: 07/31/2022 4:55:27 PM By: DDeon PillingRN, BSN Entered By: DDeon Pillingon 07/31/2022 14:06:34 -------------------------------------------------------------------------------- Clinic Level of Care Assessment Details Patient Name: Date of Service: Donald Butler, Donald LFO RD Butler. 07/31/2022 1:45 PM Medical Record Number: [id number] Account Number: [id number] of Birth/Sex: Treating RN: [birth year](61y.o. MBurnadette Pop Donald Butler Primary Care Braxdon Gappa: MHassell Done Donald Butler Other Clinician: Referring  Maleigh Bagot: Treating Jestine Bicknell/Extender: HCathleen Fears Donald Butler Weeks in Treatment: 1 Clinic Level of Care Assessment Items TOOL 4 Quantity Score X- 1 0 Use when only an EandM is performed on FOLLOW-UP visit ASSESSMENTS - Nursing Assessment / Reassessment X- 1 10 Reassessment of Co-morbidities (includes updates in patient status) X- 1 5 Reassessment of Adherence to Treatment Plan ASSESSMENTS - Wound and Skin Donald ssessment / Reassessment X - Simple Wound Assessment / Reassessment - one wound 1 5 _0  - 0 Complex Wound Assessment / Reassessment - multiple wounds _1  - 0 Dermatologic / Skin Assessment (not related to wound area) ASSESSMENTS - Focused Assessment X- 1 5 Circumferential Edema Measurements - multi extremities _2  - 0 Nutritional Assessment / Counseling / Intervention IOSTIN, Donald Butler([id number]) [id number] Page 2 of 10 _3  - 0 Lower Extremity Assessment (monofilament, tuning fork, pulses) _4  - 0 Peripheral Arterial Disease Assessment (using hand held doppler) ASSESSMENTS - Ostomy and/or Continence Assessment and Care _5  - 0 Incontinence Assessment and Management _6  - 0 Ostomy Care Assessment and Management (repouching, etc.) PROCESS - Coordination of Care X - Simple Patient / Family Education for ongoing care 1 15 _7  - 0 Complex (extensive) Patient / Family Education for ongoing care X- 1 10 Staff obtains CProgrammer, systems Records, T Results / Process Orders est _8  - 0 Staff telephones HHA, Nursing Homes / Clarify orders / etc _9  - 0 Routine Transfer to another Facility (non-emergent condition) _10  - 0 Routine Hospital Admission (non-emergent condition) _11  - 0 New Admissions / IBiomedical engineer/ Ordering NPWT Apligraf, etc. , _12  - 0 Emergency Hospital Admission (emergent condition) X- 1 10 Simple Discharge Coordination _13  - 0 Complex (extensive) Discharge Coordination PROCESS - Special Needs _14  - 0 Pediatric / Minor  Patient Management _15  - 0 Isolation Patient Management _16  - 0 Hearing / Language / Visual special needs _17  - 0 Assessment of Community assistance (transportation, D/C planning, etc.) _18  - 0 Additional assistance / Altered mentation _19  - 0 Support  Surface(s) Assessment (bed, cushion, seat, etc.) INTERVENTIONS - Wound Cleansing / Measurement X - Simple Wound Cleansing - one wound 1 5 _0  - 0 Complex Wound Cleansing - multiple wounds X- 1 5 Wound Imaging (photographs - any number of wounds) _1  - 0 Wound Tracing (instead of photographs) X- 1 5 Simple Wound Measurement - one wound _2  - 0 Complex Wound Measurement - multiple wounds INTERVENTIONS - Wound Dressings _3  - 0 Small Wound Dressing one or multiple wounds X- 1 15 Medium Wound Dressing one or multiple wounds _4  - 0 Large Wound Dressing one or multiple wounds X- 1 5 Application of Medications - topical <AOZHYQMVHQIONGEX>_5<\/MWUXLKGMWNUUVOZD>_6  - 0 Application of Medications - injection INTERVENTIONS - Miscellaneous _6  - 0 External ear exam _7  - 0 Specimen Collection (cultures, biopsies, blood, body fluids, etc.) _8  - 0 Specimen(s) / Culture(s) sent or taken to Lab for analysis _9  - 0 Patient Transfer (multiple staff / Civil Service fast streamer / Similar devices) _10  - 0 Simple Staple / Suture removal (25 or less) _11  - 0 Complex Staple / Suture removal (26 or more) _12  - 0 Hypo / Hyperglycemic Management (close monitor of Blood Glucose) Dhondt, Bastian Butler ([id number]) [id number] Page 3 of 10 _13  - 0 Ankle / Brachial Index (ABI) - do not check if billed separately X- 1 5 Vital Signs Has the patient been seen at the hospital within the last three years: Yes Total Score: 100 Level Of Care: New/Established - Level 3 Electronic Signature(s) Signed: 07/31/2022 5:16:09 PM By: Rhae Hammock RN Entered By: Rhae Hammock on 07/31/2022 14:43:34 -------------------------------------------------------------------------------- Encounter Discharge  Information Details Patient Name: Date of Service: Donald Monarch LFO RD Butler. 07/31/2022 1:45 PM Medical Record Number: [id number] Patient Account Number: [id number] Date of Birth/Sex: Treating RN: 1961-02-26 (61 y.o. [name], [place] Primary Care Sae Handrich: Hassell Done, Donald Butler Other Clinician: Referring Damany Eastman: Treating Reznor Ferrando/Extender: Cathleen Fears, Donald Butler Weeks in Treatment: 1 Encounter Discharge Information Items Discharge Condition: Stable Ambulatory Status: Ambulatory Discharge Destination: Home Transportation: Private Auto Accompanied By: self Schedule Follow-up Appointment: Yes Clinical Summary of Care: Patient Declined Electronic Signature(s) Signed: 07/31/2022 5:16:09 PM By: Rhae Hammock RN Entered By: Rhae Hammock on 07/31/2022 14:31:04 -------------------------------------------------------------------------------- Lower Extremity Assessment Details Patient Name: Date of Service: Donald Monarch LFO RD Butler. 07/31/2022 1:45 PM Medical Record Number: [id number] Patient Account Number: [id number] Date of Birth/Sex: Treating RN: [birth year] (61 y.o. Donald Butler Primary Care Jakeim Sedore: Hassell Done, Donald Butler Other Clinician: Referring Nena Hampe: Treating Zaylan Kissoon/Extender: Cathleen Fears, Donald Butler Weeks in Treatment: 1 Edema Assessment Assessed: [Left: No] [Right: Yes] Edema: [Left: Ye] [Right: s] Calf Left: Right: Point of Measurement: From Medial Instep 40 cm Ankle Left: Right: Point of Measurement: From Medial Instep 26 cm Vascular Assessment Left: [id number] Page 4 of 10Right:] Pulses: Dorsalis Pedis Palpable: [id number] Page 4 of 10Yes] Electronic Signature(s) Signed: 07/31/2022 4:55:27 PM By: Deon Pilling RN, BSN Entered By: Deon Pilling on 07/31/2022 14:10:40 -------------------------------------------------------------------------------- Multi Wound Chart  Details Patient Name: Date of Service: Donald Monarch LFO RD Butler. 07/31/2022 1:45 PM Medical Record Number: [id number] Patient Account Number: [id number] Date of Birth/Sex: Treating RN: 1961/07/03 (61 y.o. M) Primary Care Chrisanna Mishra: Hassell Done, Donald Butler Other Clinician: Referring Kerrie Latour: Treating Karyssa Amaral/Extender: Cathleen Fears, Donald Butler Weeks in Treatment: 1 Vital Signs Height(in): 69 Pulse(bpm): 94 Weight(lbs): 240 Blood Pressure(mmHg): 161/97 Body Mass Index(BMI): 35.4 Temperature(F): 99.1 Respiratory Rate(breaths/min): 20 [1:Photos:] [N/Donald:N/Donald] Right, Lateral Lower Leg N/Donald N/Donald Wound Location: Trauma N/Donald N/Donald Wounding Event: T be determined o N/Donald N/Donald Primary Etiology: Arrhythmia, Coronary Artery Disease, N/Donald N/Donald  Comorbid History: Hypertension, Gout 06/11/2022 N/Donald N/Donald Date Acquired: 1 N/Donald N/Donald Weeks of Treatment: Open N/Donald N/Donald Wound Status: No N/Donald N/Donald Wound Recurrence: 0.9x1.1x0.9 N/Donald N/Donald Measurements L x W x D (cm) 0.778 N/Donald N/Donald Donald (cm) : rea 0.7 N/Donald N/Donald Volume (cm) : 0.00% N/Donald N/Donald % Reduction in Donald rea: 30.80% N/Donald N/Donald % Reduction in Volume: 9 Starting Position 1 (o'clock): 10 Ending Position 1 (o'clock): 1.4 Maximum Distance 1 (cm): Yes N/Donald N/Donald Undermining: Full Thickness Without Exposed N/Donald N/Donald Classification: Support Structures Medium N/Donald N/Donald Exudate Amount: Serosanguineous N/Donald N/Donald Exudate Type: red, brown N/Donald N/Donald Exudate Color: Distinct, outline attached N/Donald N/Donald Wound Margin: Large (67-100%) N/Donald N/Donald Granulation Amount: Red N/Donald N/Donald Granulation Quality: None Present (0%) N/Donald N/Donald Necrotic Amount: Fat Layer (Subcutaneous Tissue): Yes N/Donald N/Donald Exposed Structures: Fascia: No Tendon: No Muscle: No Joint: No Bone: No Small (1-33%) N/Donald N/Donald EpithelializationPAL, SHELL ([id number]) [id number] Page 5 of 10 Excoriation: No N/Donald N/Donald Periwound Skin Texture: Induration: No Callus: No Crepitus: No Rash:  No Scarring: No Maceration: No N/Donald N/Donald Periwound Skin Moisture: Dry/Scaly: No Atrophie Blanche: No N/Donald N/Donald Periwound Skin Color: Cyanosis: No Ecchymosis: No Erythema: No Hemosiderin Staining: No Mottled: No Pallor: No Rubor: No Treatment Notes Wound #1 (Lower Leg) Wound Laterality: Right, Lateral Cleanser Soap and Water Discharge Instruction: May shower and wash wound with dial antibacterial soap and water prior to dressing change. Wound Cleanser Discharge Instruction: Cleanse the wound with wound cleanser prior to applying Donald clean dressing using gauze sponges, not tissue or cotton balls. Peri-Wound Care Topical Gentamicin Discharge Instruction: As directed by physician Mupirocin Ointment Discharge Instruction: Apply Mupirocin (Bactroban) as instructed Primary Dressing Sorbalgon Ag Ribbon, 1x12 (in/in) Discharge Instruction: Lightly pack into undermining and tunneling. Secondary Dressing ABD Pad, 8x10 Discharge Instruction: Apply over primary dressing as directed. Woven Gauze Sponge, Non-Sterile 4x4 in Discharge Instruction: Apply over primary dressing as directed. Secured With Compression Wrap ThreePress (3 layer compression wrap) Discharge Instruction: Apply three layer compression as directed. Compression Stockings Add-Ons Electronic Signature(s) Signed: 07/31/2022 5:16:42 PM By: Kalman Shan DO Entered By: Kalman Shan on 07/31/2022 14:42:24 -------------------------------------------------------------------------------- Multi-Disciplinary Care Plan Details Patient Name: Date of Service: Donald Monarch LFO RD Butler. 07/31/2022 1:45 PM Medical Record Number: [id number] Patient Account Number: [id number] Date of Birth/Sex: Treating RN: 01-Apr-1961 (61 y.o. [name], Donald Butler Primary Care Vonn Sliger: Hassell Done, Donald Butler Other Clinician: Referring Dannon Nguyenthi: Treating Hopelynn Gartland/Extender: Cathleen Fears, Donald Butler Weeks in Treatment: [place], Angeline Slim  ([id number]) [id number] Page 6 of 10 Active Inactive Pain, Acute or Chronic Nursing Diagnoses: Pain, acute or chronic: actual or potential Potential alteration in comfort, pain Goals: Patient will verbalize adequate pain control and receive pain control interventions during procedures as needed Date Initiated: 07/22/2022 Target Resolution Date: 08/21/2022 Goal Status: Active Patient/caregiver will verbalize comfort level met Date Initiated: 07/22/2022 Target Resolution Date: 08/22/2022 Goal Status: Active Interventions: Encourage patient to take pain medications as prescribed Provide education on pain management Treatment Activities: Administer pain control measures as ordered : 07/22/2022 Notes: Venous Leg Ulcer Nursing Diagnoses: Potential for venous Insuffiency (use before diagnosis confirmed) Goals: Patient will maintain optimal edema control Date Initiated: 07/22/2022 Target Resolution Date: 08/21/2022 Goal Status: Active Interventions: Assess peripheral edema status every visit. Compression as ordered Treatment Activities: Therapeutic compression applied : 07/22/2022 Notes: Electronic Signature(s) Signed: 07/31/2022 5:16:09 PM By: Rhae Hammock RN Entered By: Rhae Hammock on 07/31/2022 11:28:54 -------------------------------------------------------------------------------- Pain Assessment Details Patient Name: Date of Service: Donald Butler, Donald LFO RD Butler.  07/31/2022 1:45 PM Medical Record Number: [id number] Patient Account Number: [id number] Date of Birth/Sex: Treating RN: 04-02-1961 (61 y.o. Donald Butler Primary Care Rashika Bettes: Hassell Done, Donald Butler Other Clinician: Referring Shondra Capps: Treating Clyde Upshaw/Extender: Cathleen Fears, Donald Butler Weeks in Treatment: 1 Active Problems Location of Pain Severity and Description of Pain Patient Has Paino No Site Locations Rate the pain. CORDERIUS, SARACENI ([id number])  [id number] Page 7 of 10 Rate the pain. Current Pain Level: 0 Pain Management and Medication Current Pain Management: Medication: No Cold Application: No Rest: No Massage: No Activity: No T.E.N.S.: No Heat Application: No Leg drop or elevation: No Is the Current Pain Management Adequate: Adequate How does your wound impact your activities of daily livingo Sleep: No Bathing: No Appetite: No Relationship With Others: No Bladder Continence: No Emotions: No Bowel Continence: No Work: No Toileting: No Drive: No Dressing: No Hobbies: No Engineer, maintenance) Signed: 07/31/2022 4:55:27 PM By: Deon Pilling RN, BSN Entered By: Deon Pilling on 07/31/2022 14:08:16 -------------------------------------------------------------------------------- Patient/Caregiver Education Details Patient Name: Date of Service: Kristie Cowman RD Butler. 12/14/2023andnbsp1:45 PM Medical Record Number: [id number] Patient Account Number: [id number] Date of Birth/Gender: Treating RN: Jan 21, 1961 (61 y.o. Erie Noe Primary Care Physician: Hassell Done, Donald Butler Other Clinician: Referring Physician: Treating Physician/Extender: Cathleen Fears, Donald Butler Weeks in Treatment: 1 Education Assessment Education Provided To: Patient Education Topics Provided Pain: Methods: Explain/Verbal Responses: Reinforcements needed, State content correctly Electronic Signature(s) Signed: 07/31/2022 5:16:09 PM By: Rhae Hammock RN Entered By: Rhae Hammock on 07/31/2022 11:29:14 [name] ([id number]) [id number] Page 8 of 10 -------------------------------------------------------------------------------- Wound Assessment Details Patient Name: Date of Service: Donald Butler, Donald LFO RD Butler. 07/31/2022 1:45 PM Medical Record Number: [id number] Patient Account Number: [id number] Date of Birth/Sex: Treating RN: 1961-03-07 (61 y.o. Lorette Ang,  Tammi Klippel Primary Care Jashawna Reever: Hassell Done, Donald Butler Other Clinician: Referring Danaly Bari: Treating Cassius Cullinane/Extender: Cathleen Fears, Donald Butler Weeks in Treatment: 1 Wound Status Wound Number: 1 Primary Etiology: T be determined o Wound Location: Right, Lateral Lower Leg Wound Status: Open Wounding Event: Trauma Comorbid History: Arrhythmia, Coronary Artery Disease, Hypertension, Gout Date Acquired: 06/11/2022 Weeks Of Treatment: 1 Clustered Wound: No Photos Wound Measurements Length: (cm) 0.9 Width: (cm) 1.1 Depth: (cm) 0.9 Area: (cm) 0.778 Volume: (cm) 0.7 % Reduction in Area: 0% % Reduction in Volume: 30.8% Epithelialization: Small (1-33%) Tunneling: No Undermining: Yes Starting Position (o'clock): 9 Ending Position (o'clock): 10 Maximum Distance: (cm) 1.4 Wound Description Classification: Full Thickness Without Exposed Suppor Wound Margin: Distinct, outline attached Exudate Amount: Medium Exudate Type: Serosanguineous Exudate Color: red, brown t Structures Foul Odor After Cleansing: No Slough/Fibrino No Wound Bed Granulation Amount: Large (67-100%) Exposed Structure Granulation Quality: Red Fascia Exposed: No Necrotic Amount: None Present (0%) Fat Layer (Subcutaneous Tissue) Exposed: Yes Tendon Exposed: No Muscle Exposed: No Joint Exposed: No Bone Exposed: No Periwound Skin Texture Texture Color No Abnormalities Noted: No No Abnormalities Noted: No Callus: No Atrophie Blanche: No Crepitus: No Cyanosis: No Excoriation: No Ecchymosis: No Induration: No Erythema: No Rash: No Hemosiderin Staining: No Scarring: No Mottled: No Pallor: No Moisture Donald Butler, Donald Butler ([id number]) [id number] Page 9 of 10 Rubor: No No Abnormalities Noted: No Dry / Scaly: No Maceration: No Treatment Notes Wound #1 (Lower Leg) Wound Laterality: Right, Lateral Cleanser Soap and Water Discharge Instruction: May shower and wash wound  with dial antibacterial soap and water prior to dressing change. Wound Cleanser Discharge Instruction: Cleanse the wound with wound cleanser prior to applying Donald clean dressing using gauze sponges, not tissue or cotton balls. Peri-Wound Care  Topical Gentamicin Discharge Instruction: As directed by physician Mupirocin Ointment Discharge Instruction: Apply Mupirocin (Bactroban) as instructed Primary Dressing Sorbalgon Ag Ribbon, 1x12 (in/in) Discharge Instruction: Lightly pack into undermining and tunneling. Secondary Dressing ABD Pad, 8x10 Discharge Instruction: Apply over primary dressing as directed. Woven Gauze Sponge, Non-Sterile 4x4 in Discharge Instruction: Apply over primary dressing as directed. Secured With Compression Wrap ThreePress (3 layer compression wrap) Discharge Instruction: Apply three layer compression as directed. Compression Stockings Add-Ons Electronic Signature(s) Signed: 07/31/2022 4:55:27 PM By: Deon Pilling RN, BSN Entered By: Deon Pilling on 07/31/2022 14:14:27 -------------------------------------------------------------------------------- Vitals Details Patient Name: Date of Service: Donald Monarch LFO RD Butler. 07/31/2022 1:45 PM Medical Record Number: [id number] Patient Account Number: [id number] Date of Birth/Sex: Treating RN: 31-Jan-1961 (61 y.o. Donald Butler Primary Care Yariah Selvey: Hassell Done, Donald Butler Other Clinician: Referring Fleur Audino: Treating Kirby Argueta/Extender: Cathleen Fears, Donald Butler Weeks in Treatment: 1 Vital Signs Time Taken: 14:06 Temperature (F): 99.1 Height (in): 69 Pulse (bpm): 94 Weight (lbs): 240 Respiratory Rate (breaths/min): 20 Body Mass Index (BMI): 35.4 Blood Pressure (mmHg): 161/97 Reference Range: 80 - 120 mg / dl Electronic Signature(s) Signed: 07/31/2022 4:55:27 PM By: Deon Pilling RN, BSN Hagner, Khari Butler 07/31/2022 4:55:27 PM By: Deon Pilling RN, BSN Signed: ([id number])  [id number] Page 10 of 10 Entered By: Deon Pilling on 07/31/2022 14:08:07

## 2022-08-07 ENCOUNTER — Encounter (HOSPITAL_BASED_OUTPATIENT_CLINIC_OR_DEPARTMENT_OTHER): Payer: Medicare Other | Admitting: Internal Medicine

## 2022-08-07 DIAGNOSIS — I10 Essential (primary) hypertension: Secondary | ICD-10-CM | POA: Diagnosis not present

## 2022-08-07 DIAGNOSIS — I87311 Chronic venous hypertension (idiopathic) with ulcer of right lower extremity: Secondary | ICD-10-CM | POA: Diagnosis not present

## 2022-08-07 DIAGNOSIS — L97812 Non-pressure chronic ulcer of other part of right lower leg with fat layer exposed: Secondary | ICD-10-CM | POA: Diagnosis not present

## 2022-08-07 DIAGNOSIS — E785 Hyperlipidemia, unspecified: Secondary | ICD-10-CM | POA: Diagnosis not present

## 2022-08-07 DIAGNOSIS — T798XXA Other early complications of trauma, initial encounter: Secondary | ICD-10-CM | POA: Diagnosis not present

## 2022-08-07 DIAGNOSIS — I872 Venous insufficiency (chronic) (peripheral): Secondary | ICD-10-CM | POA: Diagnosis not present

## 2022-08-07 DIAGNOSIS — I2510 Atherosclerotic heart disease of native coronary artery without angina pectoris: Secondary | ICD-10-CM | POA: Diagnosis not present

## 2022-08-07 DIAGNOSIS — I4891 Unspecified atrial fibrillation: Secondary | ICD-10-CM | POA: Diagnosis not present

## 2022-08-07 NOTE — Progress Notes (Signed)
TYMAR, POLYAK ([id number]) [id number].pdf Page 1 of 7 Visit Report for 08/07/2022 Chief Complaint Document Details Patient Name: Date of Service: Donald Butler, Donald Butler LFO RD K. 08/07/2022 11:00 A M Medical Record Number: [id number] Patient Account Number: [id number] Date of Birth/Sex: Treating RN: 1961-01-05 (61 y.o. M) Primary Care Provider: Hassell Done, [name] Other Clinician: Referring Provider: Treating Provider/Extender: Cathleen Fears, [name] Weeks in Treatment: 2 Information Obtained from: Patient Chief Complaint 07/22/2022; right lower extremity wound Electronic Signature(s) Signed: 08/07/2022 2:00:17 PM By: Kalman Shan DO Entered By: Kalman Shan on 08/07/2022 12:38:44 -------------------------------------------------------------------------------- HPI Details Patient Name: Date of Service: Donald Butler LFO RD K. 08/07/2022 11:00 A M Medical Record Number: [id number] Patient Account Number: [id number] Date of Birth/Sex: Treating RN: Sep 20, 1960 (61 y.o. M) Primary Care Provider: Hassell Done, [name] Other Clinician: Referring Provider: Treating Provider/Extender: Cathleen Fears, [name] Weeks in Treatment: 2 History of Present Illness HPI Description: 07/22/2022 Mr. Donald Butler is a 61 year old male with a past medical history of venous insufficiency, hypertension and hyperlipidemia that presents to the clinic for a 80- day history of nonhealing ulcer to the right lower extremity. He states that he fell down his porch steps and developed a hematoma. He subsequently developed right lower extremity cellulitis and completed a 10-day course of Bactrim and received an injection of Rocephin. The hematoma was eventually evacuated by his primary care physician. Has been packing the area with iodoform once weekly at his primary care physician's office. Since having the area evacuated his symptoms of increased swelling, erythema and pain  have subsided. He currently denies signs of infection. 12/14; patient presents for follow-up. We have been using silver alginate with antibiotic ointment under 3 layer compression. He tolerated the wrap well. He was hoping to be able to use his work boots and states that the wrap is too thick. We will switch to a 2 layer Coflex to see if this helps. 12/21; patient presents for follow-up. We have been using silver alginate and antibiotic ointment under 2 layer Coflex. He has tolerated this well. He has shown improvement in wound healing. Electronic Signature(s) Signed: 08/07/2022 2:00:17 PM By: Kalman Shan DO Entered By: Kalman Shan on 08/07/2022 12:39:14 Physical Exam Details -------------------------------------------------------------------------------- Donald Butler ([id number]) [id number] Page 2 of 7 Patient Name: Date of Service: VU, LIEBMAN LFO RD K. 08/07/2022 11:00 A M Medical Record Number: [id number] Patient Account Number: [id number] Date of Birth/Sex: Treating RN: May 12, 1961 (61 y.o. M) Primary Care Provider: Hassell Done, [name] Other Clinician: Referring Provider: Treating Provider/Extender: Cathleen Fears, [name] Weeks in Treatment: 2 Constitutional respirations regular, non-labored and within target range for patient.. Cardiovascular 2+ dorsalis pedis/posterior tibialis pulses. Psychiatric pleasant and cooperative. Notes Right lower extremity: 2+ pitting edema to the knee. Lateral aspect there is an open wound with granulation tissue at the opening and undermining. No obvious signs of infection including increased warmth, erythema or purulent drainage. Venous stasis dermatitis Electronic Signature(s) Signed: 08/07/2022 2:00:17 PM By: Kalman Shan DO Entered By: Kalman Shan on 08/07/2022 12:39:40 -------------------------------------------------------------------------------- Physician Orders Details Patient  Name: Date of Service: Donald Butler LFO RD K. 08/07/2022 11:00 A M Medical Record Number: [id number] Patient Account Number: [id number] Date of Birth/Sex: Treating RN: 03-25-1961 (61 y.o. Donald Butler, Donald Butler Primary Care Provider: Hassell Done, [name] Other Clinician: Referring Provider: Treating Provider/Extender: Cathleen Fears, [name] Weeks in Treatment: 2 Verbal / Phone Orders: No Diagnosis Coding Follow-up Appointments ppointment in 1 week. - Dr. Heber Wamic Return A ppointment in 2 weeks. - Dr. Heber Angus Return A Anesthetic (In clinic)  Topical Lidocaine 4% applied to wound bed Bathing/ Shower/ Hygiene May shower with protection but do not get wound dressing(s) wet. Edema Control - Lymphedema / SCD / Other Elevate legs to the level of the heart or above for 30 minutes daily and/or when sitting, a frequency of: - 3-4 times a day throughout the day. Avoid standing for long periods of time. Exercise regularly Wound Treatment Wound #1 - Lower Leg Wound Laterality: Right, Lateral Cleanser: Soap and Water 1 x Per Week/30 Days Discharge Instructions: May shower and wash wound with dial antibacterial soap and water prior to dressing change. Cleanser: Wound Cleanser 1 x Per Week/30 Days Discharge Instructions: Cleanse the wound with wound cleanser prior to applying a clean dressing using gauze sponges, not tissue or cotton balls. Topical: Gentamicin 1 x Per Week/30 Days Discharge Instructions: As directed by physician Topical: Mupirocin Ointment 1 x Per Week/30 Days Discharge Instructions: Apply Mupirocin (Bactroban) as instructed Prim Dressing: Sorbalgon Ag Ribbon, 1x12 (in/in) 1 x Per Week/30 Days ary Discharge Instructions: Lightly pack into undermining and tunneling. SEDERICK, JACOBSEN ([id number]) [id number].pdf Page 3 of 7 Secondary Dressing: ABD Pad, 8x10 1 x Per Week/30 Days Discharge Instructions: Apply over primary dressing as  directed. Secondary Dressing: Woven Gauze Sponge, Non-Sterile 4x4 in 1 x Per Week/30 Days Discharge Instructions: Apply over primary dressing as directed. Compression Wrap: Kerlix Roll 4.5x3.1 (in/yd) 1 x Per Week/30 Days Discharge Instructions: Apply Kerlix and Coban compression as directed. Compression Wrap: Coban Self-Adherent Wrap 4x5 (in/yd) 1 x Per Week/30 Days Discharge Instructions: Apply over Kerlix as directed. Electronic Signature(s) Signed: 08/07/2022 2:00:17 PM By: Kalman Shan DO Entered By: Kalman Shan on 08/07/2022 12:39:47 -------------------------------------------------------------------------------- Problem List Details Patient Name: Date of Service: Donald Butler LFO RD K. 08/07/2022 11:00 A M Medical Record Number: [id number] Patient Account Number: [id number] Date of Birth/Sex: Treating RN: 10/10/1960 (61 y.o. M) Primary Care Provider: Hassell Done, [name] Other Clinician: Referring Provider: Treating Provider/Extender: Cathleen Fears, [name] Weeks in Treatment: 2 Active Problems ICD-10 Encounter Code Description Active Date MDM Diagnosis L97.812 Non-pressure chronic ulcer of other part of right lower leg with fat layer 07/22/2022 No Yes exposed T79.8XXA Other early complications of trauma, initial encounter 07/22/2022 No Yes I87.311 Chronic venous hypertension (idiopathic) with ulcer of right lower extremity 07/22/2022 No Yes I10 Essential (primary) hypertension 07/22/2022 No Yes Inactive Problems Resolved Problems Electronic Signature(s) Signed: 08/07/2022 2:00:17 PM By: Kalman Shan DO Entered By: Kalman Shan on 08/07/2022 12:38:32 Donald Butler ([id number]) [id number] Page 4 of 7 -------------------------------------------------------------------------------- Progress Note Details Patient Name: Date of Service: MD, SMOLA LFO RD K. 08/07/2022 11:00 A M Medical Record Number: [id number] Patient  Account Number: [id number] Date of Birth/Sex: Treating RN: [birth year] (61 y.o. M) Primary Care Provider: Hassell Done, [name] Other Clinician: Referring Provider: Treating Provider/Extender: Cathleen Fears, [name] Weeks in Treatment: 2 Subjective Chief Complaint Information obtained from Patient 07/22/2022; right lower extremity wound History of Present Illness (HPI) 07/22/2022 Mr. Nayan Proch is a 61 year old male with a past medical history of venous insufficiency, hypertension and hyperlipidemia that presents to the clinic for a 26- day history of nonhealing ulcer to the right lower extremity. He states that he fell down his porch steps and developed a hematoma. He subsequently developed right lower extremity cellulitis and completed a 10-day course of Bactrim and received an injection of Rocephin. The hematoma was eventually evacuated by his primary care physician. Has been packing the area with iodoform once weekly at his primary care physician's office. Since having  the area evacuated his symptoms of increased swelling, erythema and pain have subsided. He currently denies signs of infection. 12/14; patient presents for follow-up. We have been using silver alginate with antibiotic ointment under 3 layer compression. He tolerated the wrap well. He was hoping to be able to use his work boots and states that the wrap is too thick. We will switch to a 2 layer Coflex to see if this helps. 12/21; patient presents for follow-up. We have been using silver alginate and antibiotic ointment under 2 layer Coflex. He has tolerated this well. He has shown improvement in wound healing. Patient History Family History Diabetes - Father. Social History Never smoker, Marital Status - Married, Alcohol Use - Daily - few drinks a day, Drug Use - No History, Caffeine Use - Daily. Medical History Cardiovascular Patient has history of Arrhythmia - A-fib, Coronary Artery Disease,  Hypertension Musculoskeletal Patient has history of Gout Hospitalization/Surgery History - right mastoidectomy 2013. - 2009 left leg reconstruction. - 1970 right shoulder reconstruction. - back surgery [id number]. - arthroscopy x2 2006 2008 left. 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 Vitals Time Taken: 10:58 AM, Height: 69 in, Weight: 240 lbs, BMI: 35.4, Temperature: 98.5 F, Pulse: 111 bpm, Respiratory Rate: 18 breaths/min, Blood Pressure: 161/95 mmHg. Cardiovascular 2+ dorsalis pedis/posterior tibialis pulses. CORDELLE, DAHMEN ([id number]) [id number].pdf Page 5 of 7 Psychiatric pleasant and cooperative. General Notes: Right lower extremity: 2+ pitting edema to the knee. Lateral aspect there is an open wound with granulation tissue at the opening and undermining. No obvious signs of infection including increased warmth, erythema or purulent drainage. Venous stasis dermatitis Integumentary (Hair, Skin) Wound #1 status is Open. Original cause of wound was Trauma. The date acquired was: 06/11/2022. The wound has been in treatment 2 weeks. The wound is located on the Right,Lateral Lower Leg. The wound measures 0.2cm length x 0.4cm width x 0.3cm depth; 0.063cm^2 area and 0.019cm^3 volume. There is Fat Layer (Subcutaneous Tissue) exposed. There is no tunneling or undermining noted. There is a medium amount of serosanguineous drainage noted. The wound margin is distinct with the outline attached to the wound base. There is large (67-100%) red granulation within the wound bed. There is no necrotic tissue within the wound bed. The periwound  skin appearance did not exhibit: Callus, Crepitus, Excoriation, Induration, Rash, Scarring, Dry/Scaly, Maceration, Atrophie Blanche, Cyanosis, Ecchymosis, Hemosiderin Staining, Mottled, Pallor, Rubor, Erythema. Assessment Active Problems ICD-10 Non-pressure chronic ulcer of other part of right lower leg with fat layer exposed Other early complications of trauma, initial encounter Chronic venous hypertension (idiopathic) with ulcer of right lower extremity Essential (primary) hypertension Patient's wound has shown improvement in size and appearance since last clinic visit. No signs of infection. I recommended continuing the course with silver alginate and antibiotic ointment under 2 layer Coflex. Follow-up in 1 week. Procedures Wound #1 Pre-procedure diagnosis of Wound #1 is a T be determined located on the Right,Lateral Lower Leg . There was a Three Layer Compression Therapy Procedure o by Rhae Hammock, RN. Post procedure Diagnosis Wound #1: Same as Pre-Procedure Plan Follow-up Appointments: Return Appointment in 1 week. - Dr. Heber Gratiot Return Appointment in 2 weeks. - Dr. Heber Superior Anesthetic: (In clinic) Topical Lidocaine 4% applied to wound bed Bathing/ Shower/ Hygiene: May shower with protection but  do not get wound dressing(s) wet. Edema Control - Lymphedema / SCD / Other: Elevate legs to the level of the heart or above for 30 minutes daily and/or when sitting, a frequency of: - 3-4 times a day throughout the day. Avoid standing for long periods of time. Exercise regularly WOUND #1: - Lower Leg Wound Laterality: Right, Lateral Cleanser: Soap and Water 1 x Per Week/30 Days Discharge Instructions: May shower and wash wound with dial antibacterial soap and water prior to dressing change. Cleanser: Wound Cleanser 1 x Per Week/30 Days Discharge Instructions: Cleanse the wound with wound cleanser prior to applying a clean dressing using gauze sponges, not tissue or cotton  balls. Topical: Gentamicin 1 x Per Week/30 Days Discharge Instructions: As directed by physician Topical: Mupirocin Ointment 1 x Per Week/30 Days Discharge Instructions: Apply Mupirocin (Bactroban) as instructed Prim Dressing: Sorbalgon Ag Ribbon, 1x12 (in/in) 1 x Per Week/30 Days ary Discharge Instructions: Lightly pack into undermining and tunneling. Secondary Dressing: ABD Pad, 8x10 1 x Per Week/30 Days Discharge Instructions: Apply over primary dressing as directed. Secondary Dressing: Woven Gauze Sponge, Non-Sterile 4x4 in 1 x Per Week/30 Days Discharge Instructions: Apply over primary dressing as directed. Com pression Wrap: Kerlix Roll 4.5x3.1 (in/yd) 1 x Per Week/30 Days Discharge Instructions: Apply Kerlix and Coban compression as directed. Com pression Wrap: Coban Self-Adherent Wrap 4x5 (in/yd) 1 x Per Week/30 Days Discharge Instructions: Apply over Kerlix as directed. 1. Silver alginate with antibiotic ointment under 2 layer Coflex 2. Follow-up in 1 week KAYLOR, SIMENSON K ([id number]) [id number].pdf Page 6 of 7 Electronic Signature(s) Signed: 08/07/2022 2:00:17 PM By: Kalman Shan DO Entered By: Kalman Shan on 08/07/2022 12:40:22 -------------------------------------------------------------------------------- HxROS Details Patient Name: Date of Service: Donald Butler LFO RD K. 08/07/2022 11:00 A M Medical Record Number: [id number] Patient Account Number: [id number] Date of Birth/Sex: Treating RN: [birth year] (61 y.o. 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) Cardiovascular Medical History: Positive for: Arrhythmia - A-fib; Coronary Artery Disease; Hypertension Past  Medical History Notes: lyperlipidemia Gastrointestinal Medical History: Past Medical History Notes: acid reflux Genitourinary Medical History: Past Medical History Notes: internial hemrrhoid Musculoskeletal Medical History: Positive for: Gout Neurologic Medical History: Past Medical History Notes: left leg perineal nerve damage Immunizations Pneumococcal Vaccine: Received Pneumococcal Vaccination: No Implantable Devices None Hospitalization / Surgery History Type of Hospitalization/Surgery right mastoidectomy 2013 2009 left leg reconstruction 1970 right shoulder reconstruction back surgery 8182 [id number] arthroscopy x2 2006 2008 left Family and Social History Diabetes: Yes - Father; Never smoker; Marital Status - Married; Alcohol Use: Daily - few drinks a day; Drug Use: No History; Caffeine Use: Daily; [address] THANH, MOTTERN ([id number]) [id number] Page 7 of 7 Concerns: No; Food, Clothing or Shelter Needs: No; Support System Lacking: No; Transportation Concerns: No Electronic Signature(s) Signed: 08/07/2022 2:00:17 PM By: Kalman Shan DO Entered By: Kalman Shan on 08/07/2022 12:39:18 -------------------------------------------------------------------------------- SuperBill Details Patient Name: Date of Service: Donald Butler LFO RD K. 08/07/2022 Medical Record Number: [id number] Patient Account Number: [id number] Date of Birth/Sex: Treating RN: 06-Feb-1961 (61 y.o. Donald Butler, Amberley Primary Care Provider: Hassell Done, [name] Other Clinician: Referring Provider: Treating Provider/Extender: Cathleen Fears, [name] Weeks in Treatment: 2 Diagnosis Coding ICD-10 Codes Code Description [phone] Non-pressure chronic ulcer of other part of right lower leg with fat layer exposed T79.8XXA Other early complications of trauma, initial  encounter I10 Essential (primary) hypertension I87.311 Chronic venous hypertension (idiopathic)  with ulcer of right lower extremity Facility Procedures : CPT4 Code: [id number] Description: (Facility Use Only) [phone] - APPLY MULTLAY COMPRS LWR RT LEG Modifier: Quantity: 1 Physician Procedures : CPT4 Code Description Modifier [id number] - WC PHYS LEVEL 3 - EST PT ICD-10 Diagnosis Description L97.812 Non-pressure chronic ulcer of other part of right lower leg with fat layer exposed T79.8XXA Other early complications of trauma, initial  encounter I10 Essential (primary) hypertension I87.311 Chronic venous hypertension (idiopathic) with ulcer of right lower extremity Quantity: 1 Electronic Signature(s) Signed: 08/07/2022 2:00:17 PM By: Kalman Shan DO Entered By: Kalman Shan on 08/07/2022 12:40:33

## 2022-08-14 ENCOUNTER — Encounter (HOSPITAL_BASED_OUTPATIENT_CLINIC_OR_DEPARTMENT_OTHER): Payer: Medicare Other | Admitting: Internal Medicine

## 2022-08-14 DIAGNOSIS — I87311 Chronic venous hypertension (idiopathic) with ulcer of right lower extremity: Secondary | ICD-10-CM

## 2022-08-14 DIAGNOSIS — L97812 Non-pressure chronic ulcer of other part of right lower leg with fat layer exposed: Secondary | ICD-10-CM | POA: Diagnosis not present

## 2022-08-14 DIAGNOSIS — I10 Essential (primary) hypertension: Secondary | ICD-10-CM | POA: Diagnosis not present

## 2022-08-14 DIAGNOSIS — I2510 Atherosclerotic heart disease of native coronary artery without angina pectoris: Secondary | ICD-10-CM | POA: Diagnosis not present

## 2022-08-14 DIAGNOSIS — I4891 Unspecified atrial fibrillation: Secondary | ICD-10-CM | POA: Diagnosis not present

## 2022-08-14 DIAGNOSIS — T798XXA Other early complications of trauma, initial encounter: Secondary | ICD-10-CM

## 2022-08-14 DIAGNOSIS — I872 Venous insufficiency (chronic) (peripheral): Secondary | ICD-10-CM | POA: Diagnosis not present

## 2022-08-14 DIAGNOSIS — E785 Hyperlipidemia, unspecified: Secondary | ICD-10-CM | POA: Diagnosis not present

## 2022-08-14 NOTE — Progress Notes (Signed)
Donald Butler, Donald Butler ([id number]) [phone].pdf Page 1 of 7 Visit Report for 08/14/2022 Arrival Information Details Patient Name: Date of Service: Donald Butler, Donald Butler LFO RD K. 08/14/2022 2:00 PM Medical Record Number: [id number] Patient Account Number: [id number] Date of Birth/Sex: Treating RN: 17-Mar-1961 (61 y.o. M) Primary Care Demiya Magno: Hassell Done, [name] Other Clinician: Referring Alvin Rubano: Treating Harvy Riera/Extender: Cathleen Fears, [name] Weeks in Treatment: 3 Visit Information History Since Last Visit Added or deleted any medications: No Patient Arrived: Ambulatory Any new allergies or adverse reactions: No Arrival Time: 13:37 Had a fall or experienced change in No Accompanied By: self activities of daily living that may affect Transfer Assistance: None risk of falls: Patient Identification Verified: Yes Signs or symptoms of abuse/neglect since last visito No Secondary Verification Process Completed: Yes Hospitalized since last visit: No Patient Requires Transmission-Based Precautions: No Implantable device outside of the clinic excluding No Patient Has Alerts: Yes cellular tissue based products placed in the center Patient Alerts: Patient on Blood Thinner since last visit: Aspirin 46m Has Compression in Place as Prescribed: Yes Pain Present Now: No Electronic Signature(s) Signed: 08/14/2022 4:51:54 PM By: DErenest BlankEntered By: DErenest Blankon 08/14/2022 13:39:02 -------------------------------------------------------------------------------- Compression Therapy Details Patient Name: Date of Service: Donald MonarchLFO RD K. 08/14/2022 2:00 PM Medical Record Number: [id number] Account Number: [id number] of Birth/Sex: Treating RN: 708/21/1962(61y.o. MBurnadette Pop Lauren Primary Care Torria Fromer: MHassell Done [name] Other Clinician: Referring Wyatt Thorstenson: Treating Adeleine Pask/Extender: HCathleen Fears [name] Weeks in  Treatment: 3 Compression Therapy Performed for Wound Assessment: Wound #1 Right,Lateral Lower Leg Performed By: Clinician BRhae Hammock RN Compression Type: Four Layer Post Procedure Diagnosis Same as Pre-procedure Electronic Signature(s) Signed: 08/14/2022 4:52:17 PM By: BRhae HammockRN Entered By: BRhae Hammockon 08/14/2022 14:04:14 IWende Mott([id number]) [id number] Page 2 of 7 -------------------------------------------------------------------------------- Encounter Discharge Information Details Patient Name: Date of Service: Donald Butler, ODETTELFO RD K. 08/14/2022 2:00 PM Medical Record Number: [id number] Account Number: [id number] of Birth/Sex: Treating RN: [birth year](61y.o. MBurnadette Pop Lauren Primary Care Jimmye Wisnieski: MHassell Done [name] Other Clinician: Referring Lataisha Colan: Treating Senan Urey/Extender: HCathleen Fears [name] Weeks in Treatment: 3 Encounter Discharge Information Items Discharge Condition: Stable Ambulatory Status: Ambulatory Discharge Destination: Home Transportation: Private Auto Accompanied By: self Schedule Follow-up Appointment: Yes Clinical Summary of Care: Patient Declined Electronic Signature(s) Signed: 08/14/2022 4:52:17 PM By: BRhae HammockRN Entered By: BRhae Hammockon 08/14/2022 16:15:22 -------------------------------------------------------------------------------- Lower Extremity Assessment Details Patient Name: Date of Service: Donald MonarchLFO RD K. 08/14/2022 2:00 PM Medical Record Number: [id number] Account Number: [id number] of Birth/Sex: Treating RN: 7August 08, 1962(61y.o. M) Primary Care Rasul Decola: MHassell Done [name] Other Clinician: Referring Mattox Schorr: Treating Marise Knapper/Extender: HCathleen Fears [name] Weeks in Treatment: 3 Edema Assessment Assessed: [Left: No] [Right: No] Edema: [Left: Ye] [Right: s] Calf Left: Right: Point of  Measurement: From Medial Instep 41.5 cm Ankle Left: Right: Point of Measurement: From Medial Instep 26 cm Electronic Signature(s) Signed: 08/14/2022 4:51:54 PM By: DErenest BlankEntered By: DErenest Blankon 08/14/2022 13:44:50 -------------------------------------------------------------------------------- Multi Wound Chart Details Patient Name: Date of Service: Donald MonarchLFO RD K. 08/14/2022 2:00 PM Medical Record Number: [id number] Account Number: [id number] of Birth/Sex: Treating RN: [birth year](61y.o. M) Primary Care Deavion Strider: MHassell Done [name] Other Clinician: Referring Sivan Quast: Treating Laron Boorman/Extender: HCathleen Fears [name] Weeks in Treatment:[address] AAngeline Slim([id number]) [id number] Page 3 of 7 Vital Signs Height(in): 69 Pulse(bpm): 101 Weight(lbs): 240 Blood Pressure(mmHg): 133/89 Body Mass Index(BMI): 35.4 Temperature(F): 98.5 Respiratory Rate(breaths/min): 18 [1:Photos:] [  N/A:N/A] Right, Lateral Lower Leg N/A N/A Wound Location: Trauma N/A N/A Wounding Event: T be determined o N/A N/A Primary Etiology: Arrhythmia, Coronary Artery Disease, N/A N/A Comorbid History: Hypertension, Gout 06/11/2022 N/A N/A Date Acquired: 3 N/A N/A Weeks of Treatment: Open N/A N/A Wound Status: No N/A N/A Wound Recurrence: 0.1x0.1x0.1 N/A N/A Measurements L x W x D (cm) 0.008 N/A N/A A (cm) : rea 0.001 N/A N/A Volume (cm) : 99.00% N/A N/A % Reduction in Area: 99.90% N/A N/A % Reduction in Volume: Full Thickness Without Exposed N/A N/A Classification: Support Structures Medium N/A N/A Exudate Amount: Serosanguineous N/A N/A Exudate Type: red, brown N/A N/A Exudate Color: Distinct, outline attached N/A N/A Wound Margin: None Present (0%) N/A N/A Granulation Amount: Large (67-100%) N/A N/A Necrotic Amount: Fat Layer (Subcutaneous Tissue): Yes N/A N/A Exposed Structures: Fascia: No Tendon:  No Muscle: No Joint: No Bone: No Small (1-33%) N/A N/A Epithelialization: Excoriation: No N/A N/A Periwound Skin Texture: Induration: No Callus: No Crepitus: No Rash: No Scarring: No Maceration: No N/A N/A Periwound Skin Moisture: Dry/Scaly: No Atrophie Blanche: No N/A N/A Periwound Skin Color: Cyanosis: No Ecchymosis: No Erythema: No Hemosiderin Staining: No Mottled: No Pallor: No Rubor: No Compression Therapy N/A N/A Procedures Performed: Treatment Notes Electronic Signature(s) Signed: 08/14/2022 4:25:19 PM By: Kalman Shan DO Entered By: Kalman Shan on 08/14/2022 14:29:58 [name] ([id number]) [id number] Page 4 of 7 -------------------------------------------------------------------------------- Multi-Disciplinary Care Plan Details Patient Name: Date of Service: Donald Butler, Donald Butler LFO RD K. 08/14/2022 2:00 PM Medical Record Number: [id number] Patient Account Number: [id number] Date of Birth/Sex: Treating RN: 11-26-1960 (61 y.o. [name], Lauren Primary Care Loralai Eisman: Hassell Done, [name] Other Clinician: Referring Evanny Ellerbe: Treating Yaileen Hofferber/Extender: Cathleen Fears, [name] Weeks in Treatment: 3 Active Inactive Pain, Acute or Chronic Nursing Diagnoses: Pain, acute or chronic: actual or potential Potential alteration in comfort, pain Goals: Patient will verbalize adequate pain control and receive pain control interventions during procedures as needed Date Initiated: 07/22/2022 Target Resolution Date: 08/21/2022 Goal Status: Active Patient/caregiver will verbalize comfort level met Date Initiated: 07/22/2022 Target Resolution Date: 08/22/2022 Goal Status: Active Interventions: Encourage patient to take pain medications as prescribed Provide education on pain management Treatment Activities: Administer pain control measures as ordered : 07/22/2022 Notes: Venous Leg Ulcer Nursing Diagnoses: Potential for venous  Insuffiency (use before diagnosis confirmed) Goals: Patient will maintain optimal edema control Date Initiated: 07/22/2022 Target Resolution Date: 08/21/2022 Goal Status: Active Interventions: Assess peripheral edema status every visit. Compression as ordered Treatment Activities: Therapeutic compression applied : 07/22/2022 Notes: Electronic Signature(s) Signed: 08/14/2022 4:52:17 PM By: Rhae Hammock RN Entered By: Rhae Hammock on 08/14/2022 14:04:33 -------------------------------------------------------------------------------- Pain Assessment Details Patient Name: Date of Service: Donald Butler LFO RD K. 08/14/2022 2:00 PM Medical Record Number: [id number] Patient Account Number: [id number] Date of Birth/Sex: Treating RN: Sep 03, 1960 (61 y.o. M) Primary Care Grant Swager: Hassell Done, [name] Other Clinician: Referring Riley Papin: Treating Fusaye Wachtel/Extender: Cathleen Fears, [name] Weeks in Treatment[address] Angeline Slim ([id number]) [id number] Page 5 of 7 Active Problems Location of Pain Severity and Description of Pain Patient Has Paino No Site Locations Pain Management and Medication Current Pain Management: Electronic Signature(s) Signed: 08/14/2022 4:51:54 PM By: Erenest Blank Entered By: Erenest Blank on 08/14/2022 13:39:30 -------------------------------------------------------------------------------- Patient/Caregiver Education Details Patient Name: Date of Service: Donald Butler LFO RD K. 12/28/2023andnbsp2:00 PM Medical Record Number: [id number] Patient Account Number: [id number] Date of Birth/Gender: Treating RN: [birth year] (61 y.o. Erie Noe Primary Care Physician: Hassell Done, [name] Other Clinician: Referring Physician: Treating Physician/Extender: Heber Tselakai Dezza  Leda Roys, [name] Weeks in Treatment: 3 Education Assessment Education Provided To: Patient Education Topics Provided Wound/Skin  Impairment: Methods: Explain/Verbal Responses: Reinforcements needed, State content correctly Electronic Signature(s) Signed: 08/14/2022 4:52:17 PM By: Rhae Hammock RN Entered By: Rhae Hammock on 08/14/2022 16:14:17 [place], Angeline Slim ([id number]) [id number] Page 6 of 7 -------------------------------------------------------------------------------- Wound Assessment Details Patient Name: Date of Service: Donald Butler, Donald Butler LFO RD K. 08/14/2022 2:00 PM Medical Record Number: [id number] Patient Account Number: [id number] Date of Birth/Sex: Treating RN: 07-Jul-1961 (61 y.o. M) Primary Care Joesphine Schemm: Hassell Done, [name] Other Clinician: Referring Julina Altmann: Treating Zaydee Aina/Extender: Cathleen Fears, [name] Weeks in Treatment: 3 Wound Status Wound Number: 1 Primary Etiology: T be determined o Wound Location: Right, Lateral Lower Leg Wound Status: Open Wounding Event: Trauma Comorbid History: Arrhythmia, Coronary Artery Disease, Hypertension, Gout Date Acquired: 06/11/2022 Weeks Of Treatment: 3 Clustered Wound: No Photos Wound Measurements Length: (cm) 0.1 Width: (cm) 0.1 Depth: (cm) 0.1 Area: (cm) 0.008 Volume: (cm) 0.001 % Reduction in Area: 99% % Reduction in Volume: 99.9% Epithelialization: Small (1-33%) Tunneling: No Undermining: No Wound Description Classification: Full Thickness Without Exposed Support Structures Wound Margin: Distinct, outline attached Exudate Amount: Medium Exudate Type: Serosanguineous Exudate Color: red, brown Foul Odor After Cleansing: No Slough/Fibrino No Wound Bed Granulation Amount: None Present (0%) Exposed Structure Necrotic Amount: Large (67-100%) Fascia Exposed: No Necrotic Quality: Adherent Slough Fat Layer (Subcutaneous Tissue) Exposed: Yes Tendon Exposed: No Muscle Exposed: No Joint Exposed: No Bone Exposed: No Periwound Skin Texture Texture Color No Abnormalities Noted: No No  Abnormalities Noted: No Callus: No Atrophie Blanche: No Crepitus: No Cyanosis: No Excoriation: No Ecchymosis: No Induration: No Erythema: No Rash: No Hemosiderin Staining: No Scarring: No Mottled: No Pallor: No Moisture Rubor: No No Abnormalities Noted: No Dry / Scaly: No Maceration: No Electronic Signature(s) Signed: 08/14/2022 4:51:54 PM By: Erenest Blank Entered By: Erenest Blank on 08/14/2022 13:46:41 [name] ([id number]) [id number] Page 7 of 7 -------------------------------------------------------------------------------- Vitals Details Patient Name: Date of Service: Donald Butler, Donald Butler LFO RD K. 08/14/2022 2:00 PM Medical Record Number: [id number] Patient Account Number: [id number] Date of Birth/Sex: Treating RN: 21-May-1961 (61 y.o. M) Primary Care Anella Nakata: Hassell Done, [name] Other Clinician: Referring Kemper Heupel: Treating Otto Caraway/Extender: Cathleen Fears, [name] Weeks in Treatment: 3 Vital Signs Time Taken: 13:39 Temperature (F): 98.5 Height (in): 69 Pulse (bpm): 101 Weight (lbs): 240 Respiratory Rate (breaths/min): 18 Body Mass Index (BMI): 35.4 Blood Pressure (mmHg): 133/89 Reference Range: 80 - 120 mg / dl Electronic Signature(s) Signed: 08/14/2022 4:51:54 PM By: Erenest Blank Entered By: Erenest Blank on 08/14/2022 13:39:25

## 2022-08-14 NOTE — Progress Notes (Signed)
Donald Butler, Donald Butler ([id number]) [id number] Page 1 of 7 Visit Report for 08/14/2022 Chief Complaint Document Details Patient Name: Date of Service: Donald, Butler LFO RD K. 08/14/2022 2:00 PM Medical Record Number: [id number] Patient Account Number: [id number] Date of Birth/Sex: Treating RN: 1961/06/10 (61 y.o. M) Primary Care Provider: Hassell Done, [name] Other Clinician: Referring Provider: Treating Provider/Extender: Cathleen Fears, [name] Weeks in Treatment: 3 Information Obtained from: Patient Chief Complaint 07/22/2022; right lower extremity wound Electronic Signature(s) Signed: 08/14/2022 4:25:19 PM By: Kalman Shan DO Entered By: Kalman Shan on 08/14/2022 14:30:08 -------------------------------------------------------------------------------- HPI Details Patient Name: Date of Service: Donald Butler LFO RD K. 08/14/2022 2:00 PM Medical Record Number: [id number] Patient Account Number: [id number] Date of Birth/Sex: Treating RN: Nov 11, 1960 (61 y.o. M) Primary Care Provider: Hassell Done, [name] Other Clinician: Referring Provider: Treating Provider/Extender: Cathleen Fears, [name] Weeks in Treatment: 3 History of Present Illness HPI Description: 07/22/2022 Donald Butler is a 61 year old male with a past medical history of venous insufficiency, hypertension and hyperlipidemia that presents to the clinic for a 69- day history of nonhealing ulcer to the right lower extremity. He states that he fell down his porch steps and developed a hematoma. He subsequently developed right lower extremity cellulitis and completed a 10-day course of Bactrim and received an injection of Rocephin. The hematoma was eventually evacuated by his primary care physician. Has been packing the area with iodoform once weekly at his primary care physician's office. Since having the area evacuated his symptoms of increased swelling, erythema and pain have  subsided. He currently denies signs of infection. 12/14; patient presents for follow-up. We have been using silver alginate with antibiotic ointment under 3 layer compression. He tolerated the wrap well. He was hoping to be able to use his work boots and states that the wrap is too thick. We will switch to a 2 layer Coflex to see if this helps. 12/21; patient presents for follow-up. We have been using silver alginate and antibiotic ointment under 2 layer Coflex. He has tolerated this well. He has shown improvement in wound healing. 12/28; Patient presents for follow-up. We have been using silver alginate and antibiotic ointment under Kerlix/Coban. There has been a mixup with the 2 layer Coflex. Either way he continues to improve in wound healing. He denies signs of infection. Electronic Signature(s) Signed: 08/14/2022 4:25:19 PM By: Kalman Shan DO Entered By: Kalman Shan on 08/14/2022 14:31:38 [name] ([id number]) [id number] Page 2 of 7 -------------------------------------------------------------------------------- Physical Exam Details Patient Name: Date of Service: Donald, Butler LFO RD K. 08/14/2022 2:00 PM Medical Record Number: [id number] Patient Account Number: [id number] Date of Birth/Sex: Treating RN: Aug 28, 1960 (61 y.o. M) Primary Care Provider: Hassell Done, [name] Other Clinician: Referring Provider: Treating Provider/Extender: Cathleen Fears, [name] Weeks in Treatment: 3 Constitutional respirations regular, non-labored and within target range for patient.. Cardiovascular 2+ dorsalis pedis/posterior tibialis pulses. Psychiatric pleasant and cooperative. Notes Right lower extremity: 2+ pitting edema to the knee. Lateral aspect there is an open wound that is narrowed.. No obvious signs of infection including increased warmth, erythema or purulent drainage. Venous stasis dermatitis Electronic Signature(s) Signed:  08/14/2022 4:25:19 PM By: Kalman Shan DO Entered By: Kalman Shan on 08/14/2022 14:32:25 -------------------------------------------------------------------------------- Physician Orders Details Patient Name: Date of Service: Donald Butler LFO RD K. 08/14/2022 2:00 PM Medical Record Number: [id number] Patient Account Number: [id number] Date of Birth/Sex: Treating RN: [birth year] (61 y.o. [name], Lauren Primary Care Provider: Hassell Done, [name] Other Clinician: Referring Provider: Treating Provider/Extender: Cathleen Fears, [name] Weeks in Treatment:  3 Verbal / Phone Orders: No Diagnosis Coding Follow-up Appointments ppointment in 1 week. - Dr. Heber [place] Return A ppointment in 2 weeks. - Dr. Heber [place] Return A Anesthetic (In clinic) Topical Lidocaine 4% applied to wound bed Edema Control - Lymphedema / SCD / Other Avoid standing for long periods of time. Exercise regularly Wound Treatment Wound #1 - Lower Leg Wound Laterality: Right, Lateral Cleanser: Soap and Water 1 x Per Week/30 Days Discharge Instructions: May shower and wash wound with dial antibacterial soap and water prior to dressing change. Cleanser: Wound Cleanser 1 x Per Week/30 Days Discharge Instructions: Cleanse the wound with wound cleanser prior to applying a clean dressing using gauze sponges, not tissue or cotton balls. Topical: Gentamicin 1 x Per Week/30 Days Discharge Instructions: As directed by physician Topical: Mupirocin Ointment 1 x Per Week/30 Days Donald Butler, Donald Butler ([id number]) [id number] Page 3 of 7 Discharge Instructions: Apply Mupirocin (Bactroban) as instructed Prim Dressing: Endoform 2x2 in 1 x Per Week/30 Days ary Discharge Instructions: Moisten with saline Secondary Dressing: ABD Pad, 8x10 1 x Per Week/30 Days Discharge Instructions: Apply over primary dressing as directed. Secondary Dressing: Woven Gauze Sponge, Non-Sterile 4x4 in 1 x Per Week/30  Days Discharge Instructions: Apply over primary dressing as directed. Compression Wrap: CoFlex TLC XL 2-layer Compression System 4x7 (in/yd) 1 x Per Week/30 Days Discharge Instructions: Apply CoFlex 2-layer compression as directed. (alt for 4 layer) Electronic Signature(s) Signed: 08/14/2022 4:25:19 PM By: Kalman Shan DO Entered By: Kalman Shan on 08/14/2022 14:32:32 -------------------------------------------------------------------------------- Problem List Details Patient Name: Date of Service: Donald Butler LFO RD K. 08/14/2022 2:00 PM Medical Record Number: [id number] Patient Account Number: [id number] Date of Birth/Sex: Treating RN: [birth year] (61 y.o. M) Primary Care Provider: Hassell Done, [name] Other Clinician: Referring Provider: Treating Provider/Extender: Cathleen Fears, [name] Weeks in Treatment: 3 Active Problems ICD-10 Encounter Code Description Active Date MDM Diagnosis L97.812 Non-pressure chronic ulcer of other part of right lower leg with fat layer 07/22/2022 No Yes exposed T79.8XXA Other early complications of trauma, initial encounter 07/22/2022 No Yes I87.311 Chronic venous hypertension (idiopathic) with ulcer of right lower extremity 07/22/2022 No Yes I10 Essential (primary) hypertension 07/22/2022 No Yes Inactive Problems Resolved Problems Electronic Signature(s) Signed: 08/14/2022 4:25:19 PM By: Kalman Shan DO Entered By: Kalman Shan on 08/14/2022 14:29:45 [name] ([id number]) [id number] Page 4 of 7 -------------------------------------------------------------------------------- Progress Note Details Patient Name: Date of Service: Donald Butler, Donald Butler LFO RD K. 08/14/2022 2:00 PM Medical Record Number: [id number] Patient Account Number: [id number] Date of Birth/Sex: Treating RN: Dec 18, 1960 (61 y.o. M) Primary Care Provider: Hassell Done, [name] Other Clinician: Referring Provider: Treating  Provider/Extender: Cathleen Fears, [name] Weeks in Treatment: 3 Subjective Chief Complaint Information obtained from Patient 07/22/2022; right lower extremity wound History of Present Illness (HPI) 07/22/2022 Mr. Dyan Creelman is a 61 year old male with a past medical history of venous insufficiency, hypertension and hyperlipidemia that presents to the clinic for a 81- day history of nonhealing ulcer to the right lower extremity. He states that he fell down his porch steps and developed a hematoma. He subsequently developed right lower extremity cellulitis and completed a 10-day course of Bactrim and received an injection of Rocephin. The hematoma was eventually evacuated by his primary care physician. Has been packing the area with iodoform once weekly at his primary care physician's office. Since having the area evacuated his symptoms of increased swelling, erythema and pain have subsided. He currently denies signs of infection. 12/14; patient presents for follow-up. We have been using silver alginate  with antibiotic ointment under 3 layer compression. He tolerated the wrap well. He was hoping to be able to use his work boots and states that the wrap is too thick. We will switch to a 2 layer Coflex to see if this helps. 12/21; patient presents for follow-up. We have been using silver alginate and antibiotic ointment under 2 layer Coflex. He has tolerated this well. He has shown improvement in wound healing. 12/28; Patient presents for follow-up. We have been using silver alginate and antibiotic ointment under Kerlix/Coban. There has been a mixup with the 2 layer Coflex. Either way he continues to improve in wound healing. He denies signs of infection. Patient History Family History Diabetes - Father. Social History Never smoker, Marital Status - Married, Alcohol Use - Daily - few drinks a day, Drug Use - No History, Caffeine Use - Daily. Medical History Cardiovascular Patient  has history of Arrhythmia - A-fib, Coronary Artery Disease, Hypertension Musculoskeletal Patient has history of Gout Hospitalization/Surgery History - right mastoidectomy 2013. - 2009 left leg reconstruction. - 1970 right shoulder reconstruction. - back surgery [id number]. - arthroscopy x2 2006 2008 left. 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 Vitals Time Taken: 1:39 PM, Height: 69 in, Weight: 240 lbs, BMI: 35.4, Temperature: 98.5 F, Pulse: 101 bpm, Respiratory Rate: 18 breaths/min, Blood Pressure: 133/89 mmHg. Donald Butler, Donald Butler ([id number]) [id number] Page 5 of 7 Cardiovascular 2+ dorsalis pedis/posterior tibialis pulses. Psychiatric pleasant and cooperative. General Notes: Right lower extremity: 2+ pitting edema to the knee. Lateral aspect there is an open wound that is narrowed.. No obvious signs of infection including increased warmth, erythema or purulent drainage. Venous stasis dermatitis Integumentary (Hair, Skin) Wound #1 status is Open. Original cause of wound was Trauma. The date acquired was: 06/11/2022. The wound has been in treatment 3 weeks. The wound is located on the Right,Lateral Lower Leg. The wound measures 0.1cm length x 0.1cm width x 0.1cm depth; 0.008cm^2 area and 0.001cm^3 volume. There is Fat Layer (Subcutaneous Tissue) exposed. There is no tunneling or undermining noted. There is a medium amount of serosanguineous drainage noted. The wound margin is distinct with the outline attached to the wound base. There is no granulation within the wound bed. There is a large (67-100%) amount of necrotic  tissue within the wound bed including Adherent Slough. The periwound skin appearance did not exhibit: Callus, Crepitus, Excoriation, Induration, Rash, Scarring, Dry/Scaly, Maceration, Atrophie Blanche, Cyanosis, Ecchymosis, Hemosiderin Staining, Mottled, Pallor, Rubor, Erythema. Assessment Active Problems ICD-10 Non-pressure chronic ulcer of other part of right lower leg with fat layer exposed Other early complications of trauma, initial encounter Chronic venous hypertension (idiopathic) with ulcer of right lower extremity Essential (primary) hypertension Patient's wound has shown improvement in size and apperance since last clinic visit. I recommended endoform with antibiotic ointment to the wound under 2 layer Coflex. Follow-up in 1 week. Procedures Wound #1 Pre-procedure diagnosis of Wound #1 is a Venous Leg Ulcer located on the Right,Lateral Lower Leg . There was a Four Layer Compression Therapy Procedure by Rhae Hammock, RN. Post procedure Diagnosis Wound #1: Same as Pre-Procedure Plan Follow-up Appointments: Return Appointment in 1 week. - Dr. Heber [place] Return Appointment in 2 weeks. - Dr. Heber [place] Anesthetic: (In clinic) Topical Lidocaine 4% applied to wound bed Edema Control - Lymphedema /  SCD / Other: Avoid standing for long periods of time. Exercise regularly WOUND #1: - Lower Leg Wound Laterality: Right, Lateral Cleanser: Soap and Water 1 x Per Week/30 Days Discharge Instructions: May shower and wash wound with dial antibacterial soap and water prior to dressing change. Cleanser: Wound Cleanser 1 x Per Week/30 Days Discharge Instructions: Cleanse the wound with wound cleanser prior to applying a clean dressing using gauze sponges, not tissue or cotton balls. Topical: Gentamicin 1 x Per Week/30 Days Discharge Instructions: As directed by physician Topical: Mupirocin Ointment 1 x Per Week/30 Days Discharge Instructions: Apply Mupirocin (Bactroban) as instructed Prim  Dressing: Endoform 2x2 in 1 x Per Week/30 Days ary Discharge Instructions: Moisten with saline Secondary Dressing: ABD Pad, 8x10 1 x Per Week/30 Days Discharge Instructions: Apply over primary dressing as directed. Secondary Dressing: Woven Gauze Sponge, Non-Sterile 4x4 in 1 x Per Week/30 Days Discharge Instructions: Apply over primary dressing as directed. Com pression Wrap: CoFlex TLC XL 2-layer Compression System 4x7 (in/yd) 1 x Per Week/30 Days Discharge Instructions: Apply CoFlex 2-layer compression as directed. (alt for 4 layer) 1. Endoform with antibiotic ointment under 2 layer Coflexooright lower extremity 2. Follow-up in 1 week Donald Butler, Donald K ([id number]) [id number] Page 6 of 7 Electronic Signature(s) Signed: 08/14/2022 4:25:19 PM By: Kalman Shan DO Entered By: Kalman Shan on 08/14/2022 14:39:56 -------------------------------------------------------------------------------- HxROS Details Patient Name: Date of Service: Donald Butler LFO RD K. 08/14/2022 2:00 PM Medical Record Number: [id number] Patient Account Number: [id number] Date of Birth/Sex: Treating RN: Feb 14, 1961 (61 y.o. 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) Cardiovascular Medical History: Positive for: Arrhythmia - A-fib; Coronary Artery Disease; Hypertension Past Medical History Notes: lyperlipidemia Gastrointestinal Medical History: Past Medical History Notes: acid reflux Genitourinary Medical History: Past Medical History Notes: internial hemrrhoid Musculoskeletal Medical History: Positive for: Gout Neurologic Medical History: Past Medical History Notes: left leg perineal nerve  damage Immunizations Pneumococcal Vaccine: Received Pneumococcal Vaccination: No Implantable Devices None Hospitalization / Surgery History Type of Hospitalization/Surgery right mastoidectomy 2013 2009 left leg reconstruction 1970 right shoulder reconstruction back surgery [id number] arthroscopy x2 2006 2008 left Family and Social History Diabetes: Yes - Father; Never smoker; Marital Status - Married; Alcohol Use: Daily - few drinks a day; Drug Use: No History; Caffeine Use: Daily; Financial Concerns: No; Food, Clothing or Shelter Needs: No; Support System Lacking: No; Transportation Concerns: No Donald Butler, Donald Butler ([id number]) [id number] Page 7 of 7 Electronic Signature(s) Signed: 08/14/2022 4:25:19 PM By: Kalman Shan DO Entered By: Kalman Shan on 08/14/2022 14:31:47 -------------------------------------------------------------------------------- SuperBill Details Patient Name: Date of Service: Donald Butler LFO RD K. 08/14/2022 Medical Record Number: [id number] Patient Account Number: [id number] Date of Birth/Sex: Treating RN: September 06, 1960 (61 y.o. M) Primary Care Provider: Hassell Done, [name] Other Clinician: Referring Provider: Treating Provider/Extender: Cathleen Fears, [name] Weeks in Treatment: 3 Diagnosis Coding ICD-10 Codes Code Description [phone] Non-pressure chronic ulcer of other part of right lower leg with fat layer exposed T79.8XXA Other early complications of trauma, initial encounter I87.311 Chronic venous hypertension (idiopathic) with ulcer of right lower extremity I10 Essential (primary) hypertension Facility Procedures : CPT4 Code: [id number] Description: (Facility Use Only) [phone] - APPLY MULTLAY COMPRS LWR RT LEG Modifier: Quantity: 1 Physician Procedures : CPT4 Code Description Modifier [phone] [id number] - WC PHYS LEVEL 3 - EST PT ICD-10 Diagnosis Description Q75.916 Non-pressure  chronic ulcer of other part  of right lower leg with fat layer exposed T79.8XXA Other early complications of trauma, initial  encounter I87.311 Chronic venous hypertension (idiopathic) with ulcer of right lower extremity I10 Essential (primary) hypertension Quantity: 1 Electronic Signature(s) Signed: 08/14/2022 4:25:19 PM By: Kalman Shan DO Signed: 08/14/2022 4:52:17 PM By: Rhae Hammock RN Entered By: Rhae Hammock on 08/14/2022 16:14:58

## 2022-08-21 ENCOUNTER — Encounter (HOSPITAL_BASED_OUTPATIENT_CLINIC_OR_DEPARTMENT_OTHER): Payer: Medicare Other | Attending: Internal Medicine | Admitting: Internal Medicine

## 2022-08-21 DIAGNOSIS — I87311 Chronic venous hypertension (idiopathic) with ulcer of right lower extremity: Secondary | ICD-10-CM | POA: Diagnosis not present

## 2022-08-21 DIAGNOSIS — E785 Hyperlipidemia, unspecified: Secondary | ICD-10-CM | POA: Insufficient documentation

## 2022-08-21 DIAGNOSIS — T798XXA Other early complications of trauma, initial encounter: Secondary | ICD-10-CM | POA: Insufficient documentation

## 2022-08-21 DIAGNOSIS — I2510 Atherosclerotic heart disease of native coronary artery without angina pectoris: Secondary | ICD-10-CM | POA: Diagnosis not present

## 2022-08-21 DIAGNOSIS — I4891 Unspecified atrial fibrillation: Secondary | ICD-10-CM | POA: Diagnosis not present

## 2022-08-21 DIAGNOSIS — K219 Gastro-esophageal reflux disease without esophagitis: Secondary | ICD-10-CM | POA: Diagnosis not present

## 2022-08-21 DIAGNOSIS — L97812 Non-pressure chronic ulcer of other part of right lower leg with fat layer exposed: Secondary | ICD-10-CM | POA: Diagnosis not present

## 2022-08-21 DIAGNOSIS — I10 Essential (primary) hypertension: Secondary | ICD-10-CM | POA: Diagnosis not present

## 2022-08-21 DIAGNOSIS — W19XXXA Unspecified fall, initial encounter: Secondary | ICD-10-CM | POA: Insufficient documentation

## 2022-08-21 DIAGNOSIS — M109 Gout, unspecified: Secondary | ICD-10-CM | POA: Diagnosis not present

## 2022-08-21 NOTE — Progress Notes (Signed)
Donald, Butler ([id number]) [id number] Page 1 of 8 Visit Report for 08/07/2022 Arrival Information Details Patient Name: Date of Service: Donald Butler, Donald Butler Donald RD K. 08/07/2022 11:00 A M Medical Record Number: [id number] Patient Account Number: [id number] Date of Birth/Sex: Treating RN: 04/28/1961 (62 y.o. Mare Ferrari Primary Care Lucindy Borel: Hassell Done, Donald Other Clinician: Referring Donald Butler: Treating Tallulah Hosman/Extender: Cathleen Fears, Donald Butler in Treatment: 2 Visit Information History Since Last Visit Added or deleted any medications: No Patient Arrived: Ambulatory Any new allergies or adverse reactions: No Arrival Time: 10:58 Had a fall or experienced change in No Accompanied By: Self activities of daily living that may affect Transfer Assistance: None risk of falls: Patient Identification Verified: Yes Signs or symptoms of abuse/neglect since last visito No Secondary Verification Process Completed: Yes Hospitalized since last visit: No Patient Requires Transmission-Based Precautions: No Implantable device outside of the clinic excluding No Patient Has Alerts: Yes cellular tissue based products placed in the center Patient Alerts: Patient on Blood Thinner since last visit: Aspirin 82m Has Dressing in Place as Prescribed: Yes Has Compression in Place as Prescribed: Yes Pain Present Now: No Electronic Signature(s) Signed: 08/13/2022 12:22:10 PM By: PSharyn CreamerRN, BSN Entered By: PSharyn Creameron 08/07/2022 10:58:56 -------------------------------------------------------------------------------- Compression Therapy Details Patient Name: Date of Service: IDannial MonarchLFO RD K. 08/07/2022 11:00 A M Medical Record Number: [id number] Account Number: [id number] of Birth/Sex: Treating RN: 718-Sep-1962(62y.o. MBurnadette Pop Lauren Primary Care Dallon Dacosta: MHassell Done Donald Other Clinician: Referring Donald Butler: Treating  Sade Mehlhoff/Extender: HCathleen Fears Donald Butler in Treatment: 2 Compression Therapy Performed for Wound Assessment: Wound #1 Right,Lateral Lower Leg Performed By: Clinician BRhae Hammock RN Compression Type: Three Layer Post Procedure Diagnosis Same as Pre-procedure Electronic Signature(s) Signed: 08/20/2022 5:35:40 PM By: BRhae HammockRN Entered By: BRhae Hammockon 08/07/2022 11:36:38 [place] AAngeline Slim([id number]) [id number] Page 2 of 8 -------------------------------------------------------------------------------- Encounter Discharge Information Details Patient Name: Date of Service: [name], HIRESLFO RD K. 08/07/2022 11:00 A M Medical Record Number: [id number] Account Number: [id number] of Birth/Sex: Treating RN: 710/22/1962(62y.o. MBurnadette Pop [place]Primary Care Shaana Acocella: MHassell Done Donald Other Clinician: Referring Andrzej Scully: Treating Jeremih Dearmas/Extender: HCathleen Fears Donald Butler in Treatment: 2 Encounter Discharge Information Items Discharge Condition: Stable Ambulatory Status: Ambulatory Discharge Destination: Home Transportation: Private Auto Accompanied By: self Schedule Follow-up Appointment: Yes Clinical Summary of Care: Patient Declined Electronic Signature(s) Signed: 08/20/2022 5:35:40 PM By: BRhae HammockRN Entered By: BRhae Hammockon 08/07/2022 11:37:17 -------------------------------------------------------------------------------- Lower Extremity Assessment Details Patient Name: Date of Service: IDannial MonarchLFO RD K. 08/07/2022 11:00 A M Medical Record Number: [id number] Account Number: [id number] of Birth/Sex: Treating RN: 7Oct 17, 1962(62y.o. MMare FerrariPrimary Care Undra Harriman: MHassell Done Donald Other Clinician: Referring Fredderick Swanger: Treating Rykin Route/Extender: HCathleen Fears Donald Butler in Treatment: 2 Edema Assessment Assessed:  [Left: No] [Right: No] Edema: [Left: Ye] [Right: s] Calf Left: Right: Point of Measurement: From Medial Instep 42.7 cm Ankle Left: Right: Point of Measurement: From Medial Instep 26 cm Vascular Assessment Pulses: Dorsalis Pedis Palpable: [Right:Yes] Electronic Signature(s) Signed: 08/13/2022 12:22:10 PM By: PSharyn CreamerRN, BSN Entered By: PSharyn Creameron 08/07/2022 11:05:07 Multi Wound Chart Details -------------------------------------------------------------------------------- [name]([id number]) [id number] Page 3 of 8 Patient Name: Date of Service: IGARETT, TETZLOFFLFO RD K. 08/07/2022 11:00 A M Medical Record Number: [id number] Account Number: [id number] of Birth/Sex: Treating RN: 7Jul 22, 1962(62y.o. M) Primary Care Eeva Schlosser: MHassell Done Donald Other Clinician: Referring Adora Yeh: Treating Kathalene Sporer/Extender: HKalman Shan  Hassell Done, Donald Butler in Treatment: 2 Vital Signs Height(in): 69 Pulse(bpm): 111 Weight(lbs): 240 Blood Pressure(mmHg): 161/95 Body Mass Index(BMI): 35.4 Temperature(F): 98.5 Respiratory Rate(breaths/min): 18 Wound Assessments Wound Number: 1 N/A N/A Photos: N/A N/A Right, Lateral Lower Leg N/A N/A Wound Location: Trauma N/A N/A Wounding Event: T be determined o N/A N/A Primary Etiology: Arrhythmia, Coronary Artery Disease, N/A N/A Comorbid History: Hypertension, Gout 06/11/2022 N/A N/A Date Acquired: 2 N/A N/A Butler of Treatment: Open N/A N/A Wound Status: No N/A N/A Wound Recurrence: 0.2x0.4x0.3 N/A N/A Measurements L x W x D (cm) 0.063 N/A N/A A (cm) : rea 0.019 N/A N/A Volume (cm) : 91.90% N/A N/A % Reduction in Area: 98.10% N/A N/A % Reduction in Volume: Full Thickness Without Exposed N/A N/A Classification: Support Structures Medium N/A N/A Exudate Amount: Serosanguineous N/A N/A Exudate Type: red, brown N/A N/A Exudate Color: Distinct, outline attached N/A  N/A Wound Margin: Large (67-100%) N/A N/A Granulation Amount: Red N/A N/A Granulation Quality: None Present (0%) N/A N/A Necrotic Amount: Fat Layer (Subcutaneous Tissue): Yes N/A N/A Exposed Structures: Fascia: No Tendon: No Muscle: No Joint: No Bone: No Small (1-33%) N/A N/A Epithelialization: Excoriation: No N/A N/A Periwound Skin Texture: Induration: No Callus: No Crepitus: No Rash: No Scarring: No Maceration: No N/A N/A Periwound Skin Moisture: Dry/Scaly: No Atrophie Blanche: No N/A N/A Periwound Skin Color: Cyanosis: No Ecchymosis: No Erythema: No Hemosiderin Staining: No Mottled: No Pallor: No Rubor: No Compression Therapy N/A N/A Procedures Performed: Treatment Notes Wound #1 (Lower Leg) Wound Laterality: Right, Lateral Cleanser Soap and Water Discharge Instruction: May shower and wash wound with dial antibacterial soap and water prior to dressing change. ATHA, MCBAIN ([id number]) [id number] Page 4 of 8 Wound Cleanser Discharge Instruction: Cleanse the wound with wound cleanser prior to applying a clean dressing using gauze sponges, not tissue or cotton balls. Peri-Wound Care Topical Gentamicin Discharge Instruction: As directed by physician Mupirocin Ointment Discharge Instruction: Apply Mupirocin (Bactroban) as instructed Primary Dressing Sorbalgon Ag Ribbon, 1x12 (in/in) Discharge Instruction: Lightly pack into undermining and tunneling. Secondary Dressing ABD Pad, 8x10 Discharge Instruction: Apply over primary dressing as directed. Woven Gauze Sponge, Non-Sterile 4x4 in Discharge Instruction: Apply over primary dressing as directed. Secured With Compression Wrap Kerlix Roll 4.5x3.1 (in/yd) Discharge Instruction: Apply Kerlix and Coban compression as directed. Coban Self-Adherent Wrap 4x5 (in/yd) Discharge Instruction: Apply over Kerlix as directed. Compression Stockings Add-Ons Electronic Signature(s) Signed:  08/07/2022 2:00:17 PM By: Kalman Shan DO Entered By: Kalman Shan on 08/07/2022 12:38:37 -------------------------------------------------------------------------------- Multi-Disciplinary Care Plan Details Patient Name: Date of Service: Donald Butler Donald RD K. 08/07/2022 11:00 A M Medical Record Number: [id number] Patient Account Number: [id number] Date of Birth/Sex: Treating RN: 1960/12/10 (62 y.o. [name], Lauren Primary Care Renu Asby: Hassell Done, Donald Other Clinician: Referring Tristan Bramble: Treating Lareta Bruneau/Extender: Cathleen Fears, Donald Butler in Treatment: 2 Active Inactive Pain, Acute or Chronic Nursing Diagnoses: Pain, acute or chronic: actual or potential Potential alteration in comfort, pain Goals: Patient will verbalize adequate pain control and receive pain control interventions during procedures as needed Date Initiated: 07/22/2022 Target Resolution Date: 08/21/2022 Goal Status: Active Patient/caregiver will verbalize comfort level met Date Initiated: 07/22/2022 Target Resolution Date: 08/22/2022 Goal Status: Active Interventions: Donald, Butler ([id number]) [id number] Page 5 of 8 Encourage patient to take pain medications as prescribed Provide education on pain management Treatment Activities: Administer pain control measures as ordered : 07/22/2022 Notes: Venous Leg Ulcer Nursing Diagnoses: Potential for venous Insuffiency (use before diagnosis confirmed) Goals: Patient will maintain  optimal edema control Date Initiated: 07/22/2022 Target Resolution Date: 08/21/2022 Goal Status: Active Interventions: Assess peripheral edema status every visit. Compression as ordered Treatment Activities: Therapeutic compression applied : 07/22/2022 Notes: Electronic Signature(s) Signed: 08/20/2022 5:35:40 PM By: Rhae Hammock RN Entered By: Rhae Hammock on 08/07/2022  11:35:35 -------------------------------------------------------------------------------- Pain Assessment Details Patient Name: Date of Service: Donald Butler Donald RD K. 08/07/2022 11:00 A M Medical Record Number: [id number] Patient Account Number: [id number] Date of Birth/Sex: Treating RN: Jan 11, 1961 (62 y.o. Mare Ferrari Primary Care Chianna Spirito: Hassell Done, Donald Other Clinician: Referring Donald Butler: Treating Hani Patnode/Extender: Cathleen Fears, Donald Butler in Treatment: 2 Active Problems Location of Pain Severity and Description of Pain Patient Has Paino No Site Locations Pain Management and Medication Current Pain Management: Donald, Butler ([id number]) [id number] Page 6 of 8 Electronic Signature(s) Signed: 08/13/2022 12:22:10 PM By: Sharyn Creamer RN, BSN Entered By: Sharyn Creamer on 08/07/2022 10:59:33 -------------------------------------------------------------------------------- Patient/Caregiver Education Details Patient Name: Date of Service: Donald Butler Donald RD K. 12/21/2023andnbsp11:00 A M Medical Record Number: [id number] Patient Account Number: [id number] Date of Birth/Gender: Treating RN: [birth year] (62 y.o. Erie Noe Primary Care Physician: Hassell Done, Donald Other Clinician: Referring Physician: Treating Physician/Extender: Cathleen Fears, Donald Butler in Treatment: 2 Education Assessment Education Provided To: Patient Education Topics Provided Wound/Skin Impairment: Methods: Explain/Verbal Responses: Reinforcements needed, State content correctly Electronic Signature(s) Signed: 08/20/2022 5:35:40 PM By: Rhae Hammock RN Entered By: Rhae Hammock on 08/07/2022 11:35:48 -------------------------------------------------------------------------------- Wound Assessment Details Patient Name: Date of Service: Donald Butler Donald RD K. 08/07/2022 11:00 A M Medical Record Number: [id number] Patient  Account Number: [id number] Date of Birth/Sex: Treating RN: [birth year] (62 y.o. Mare Ferrari Primary Care Anjolaoluwa Siguenza: Hassell Done, Donald Other Clinician: Referring Donald Butler: Treating Madysun Thall/Extender: Cathleen Fears, Donald Butler in Treatment: 2 Wound Status Wound Number: 1 Primary Etiology: T be determined o Wound Location: Right, Lateral Lower Leg Wound Status: Open Wounding Event: Trauma Comorbid History: Arrhythmia, Coronary Artery Disease, Hypertension, Gout Date Acquired: 06/11/2022 Butler Of Treatment: 2 Clustered Wound: No Photos Donald, Butler ([id number]) [id number] Page 7 of 8 Wound Measurements Length: (cm) 0.2 Width: (cm) 0.4 Depth: (cm) 0.3 Area: (cm) 0.063 Volume: (cm) 0.019 % Reduction in Area: 91.9% % Reduction in Volume: 98.1% Epithelialization: Small (1-33%) Tunneling: No Undermining: No Wound Description Classification: Full Thickness Without Exposed Suppor Wound Margin: Distinct, outline attached Exudate Amount: Medium Exudate Type: Serosanguineous Exudate Color: red, brown t Structures Foul Odor After Cleansing: No Slough/Fibrino No Wound Bed Granulation Amount: Large (67-100%) Exposed Structure Granulation Quality: Red Fascia Exposed: No Necrotic Amount: None Present (0%) Fat Layer (Subcutaneous Tissue) Exposed: Yes Tendon Exposed: No Muscle Exposed: No Joint Exposed: No Bone Exposed: No Periwound Skin Texture Texture Color No Abnormalities Noted: No No Abnormalities Noted: No Callus: No Atrophie Blanche: No Crepitus: No Cyanosis: No Excoriation: No Ecchymosis: No Induration: No Erythema: No Rash: No Hemosiderin Staining: No Scarring: No Mottled: No Pallor: No Moisture Rubor: No No Abnormalities Noted: No Dry / Scaly: No Maceration: No Electronic Signature(s) Signed: 08/13/2022 12:22:10 PM By: Sharyn Creamer RN, BSN Entered By: Sharyn Creamer on 08/07/2022  11:07:35 -------------------------------------------------------------------------------- Vitals Details Patient Name: Date of Service: Donald Butler Donald RD K. 08/07/2022 11:00 A M Medical Record Number: [id number] Patient Account Number: [id number] Date of Birth/Sex: Treating RN: 05-25-1961 (62 y.o. Mare Ferrari Primary Care Chyann Ambrocio: Hassell Done, Donald Other Clinician: Referring Donald Butler: Treating Pia Jedlicka/Extender: Cathleen Fears, Donald Butler in Treatment: Narka, Jamesyn K ([id number]) [id number] Page 8 of 8 Time Taken: 10:58 Temperature (  F): 98.5 Height (in): 69 Pulse (bpm): 111 Weight (lbs): 240 Respiratory Rate (breaths/min): 18 Body Mass Index (BMI): 35.4 Blood Pressure (mmHg): 161/95 Reference Range: 80 - 120 mg / dl Electronic Signature(s) Signed: 08/13/2022 12:22:10 PM By: Sharyn Creamer RN, BSN Entered By: Sharyn Creamer on 08/07/2022 10:59:24

## 2022-08-21 NOTE — Progress Notes (Signed)
Donald Butler ([id number]) [id number] Page 1 of 6 Visit Report for 08/21/2022 Chief Complaint Document Details Patient Name: Date of Service: Donald Butler, Donald Butler LFO RD K. 08/21/2022 12:30 PM Medical Record Number: [id number] Patient Account Number: [id number] Date of Birth/Sex: Treating RN: 04/10/1961 (62 y.o. M) Primary Care Provider: Hassell Done, [name] Other Clinician: Referring Provider: Treating Provider/Extender: Cathleen Fears, [name] Weeks in Treatment: 4 Information Obtained from: Patient Chief Complaint 07/22/2022; right lower extremity wound Electronic Signature(s) Signed: 08/21/2022 2:54:58 PM By: Kalman Shan DO Entered By: Kalman Shan on 08/21/2022 13:30:13 -------------------------------------------------------------------------------- HPI Details Patient Name: Date of Service: Donald Butler LFO RD K. 08/21/2022 12:30 PM Medical Record Number: [id number] Patient Account Number: [id number] Date of Birth/Sex: Treating RN: [birth year] (62 y.o. M) Primary Care Provider: Hassell Done, [name] Other Clinician: Referring Provider: Treating Provider/Extender: Cathleen Fears, [name] Weeks in Treatment: 4 History of Present Illness HPI Description: 07/22/2022 Mr. Donald Butler is a 62 year old male with a past medical history of venous insufficiency, hypertension and hyperlipidemia that presents to the clinic for a 16- day history of nonhealing ulcer to the right lower extremity. He states that he fell down his porch steps and developed a hematoma. He subsequently developed right lower extremity cellulitis and completed a 10-day course of Bactrim and received an injection of Rocephin. The hematoma was eventually evacuated by his primary care physician. Has been packing the area with iodoform once weekly at his primary care physician's office. Since having the area evacuated his symptoms of increased swelling, erythema and pain have  subsided. He currently denies signs of infection. 12/14; patient presents for follow-up. We have been using silver alginate with antibiotic ointment under 3 layer compression. He tolerated the wrap well. He was hoping to be able to use his work boots and states that the wrap is too thick. We will switch to a 2 layer Coflex to see if this helps. 12/21; patient presents for follow-up. We have been using silver alginate and antibiotic ointment under 2 layer Coflex. He has tolerated this well. He has shown improvement in wound healing. 12/28; Patient presents for follow-up. We have been using silver alginate and antibiotic ointment under Kerlix/Coban. There has been a mixup with the 2 layer Coflex. Either way he continues to improve in wound healing. He denies signs of infection. 1/4; patient presents for follow-up. At last clinic visit we used endoform with antibiotic ointment under 2 layer Coflex. Patient did well with this and his wound is healed. Electronic Signature(s) Signed: 08/21/2022 2:54:58 PM By: Kalman Shan DO Entered By: Kalman Shan on 08/21/2022 13:30:37 [name] ([id number]) [id number] Page 2 of 6 -------------------------------------------------------------------------------- Physical Exam Details Patient Name: Date of Service: Butler, Donald LFO RD K. 08/21/2022 12:30 PM Medical Record Number: [id number] Patient Account Number: [id number] Date of Birth/Sex: Treating RN: March 14, 1961 (62 y.o. M) Primary Care Provider: Hassell Done, [name] Other Clinician: Referring Provider: Treating Provider/Extender: Cathleen Fears, [name] Weeks in Treatment: 4 Constitutional respirations regular, non-labored and within target range for patient.. Cardiovascular 2+ dorsalis pedis/posterior tibialis pulses. Psychiatric pleasant and cooperative. Notes Right lower extremity: T the lateral aspect there is epithelization to the previous wound site.  Decent edema control. Venous stasis dermatitis. o Electronic Signature(s) Signed: 08/21/2022 2:54:58 PM By: Kalman Shan DO Entered By: Kalman Shan on 08/21/2022 13:33:33 -------------------------------------------------------------------------------- Physician Orders Details Patient Name: Date of Service: Donald Butler LFO RD K. 08/21/2022 12:30 PM Medical Record Number: [id number] Patient Account Number: [id number] Date of Birth/Sex: Treating RN: 02/27/1961 (62 y.o. Donald Butler, Donald Butler Primary  Care Provider: Hassell Done, [name] Other Clinician: Referring Provider: Treating Provider/Extender: Cathleen Fears, [name] Weeks in Treatment: 4 Verbal / Phone Orders: No Diagnosis Coding ICD-10 Coding Code Description [phone] Non-pressure chronic ulcer of other part of right lower leg with fat layer exposed T79.8XXA Other early complications of trauma, initial encounter I87.311 Chronic venous hypertension (idiopathic) with ulcer of right lower extremity I10 Essential (primary) hypertension Discharge From [facility] Services Discharge from [place] - call if any future wound care needs. wear the compression garment tubigrip size E for one week. Electronic Signature(s) Signed: 08/21/2022 2:54:58 PM By: Kalman Shan DO Entered By: Kalman Shan on 08/21/2022 13:33:39 [name] ([id number]) [id number] Page 3 of 6 -------------------------------------------------------------------------------- Problem List Details Patient Name: Date of Service: HARTWELL, VANDIVER LFO RD K. 08/21/2022 12:30 PM Medical Record Number: [id number] Patient Account Number: [id number] Date of Birth/Sex: Treating RN: 1961-07-18 (62 y.o. Donald Butler Primary Care Provider: Hassell Done, [name] Other Clinician: Referring Provider: Treating Provider/Extender: Cathleen Fears, [name] Weeks in Treatment: 4 Active Problems ICD-10 Encounter Code Description  Active Date MDM Diagnosis L97.812 Non-pressure chronic ulcer of other part of right lower leg with fat layer 07/22/2022 No Yes exposed T79.8XXA Other early complications of trauma, initial encounter 07/22/2022 No Yes I87.311 Chronic venous hypertension (idiopathic) with ulcer of right lower extremity 07/22/2022 No Yes I10 Essential (primary) hypertension 07/22/2022 No Yes Inactive Problems Resolved Problems Electronic Signature(s) Signed: 08/21/2022 2:54:58 PM By: Kalman Shan DO Entered By: Kalman Shan on 08/21/2022 13:30:04 -------------------------------------------------------------------------------- Progress Note Details Patient Name: Date of Service: Donald Butler LFO RD K. 08/21/2022 12:30 PM Medical Record Number: [id number] Patient Account Number: [id number] Date of Birth/Sex: Treating RN: [birth year] (62 y.o. M) Primary Care Provider: Hassell Done, [name] Other Clinician: Referring Provider: Treating Provider/Extender: Cathleen Fears, [name] Weeks in Treatment: 4 Subjective Chief Complaint Information obtained from Patient 07/22/2022; right lower extremity wound History of Present Illness (HPI) 07/22/2022 Mr. Donald Butler is a 62 year old male with a past medical history of venous insufficiency, hypertension and hyperlipidemia that presents to the clinic for a 80- day history of nonhealing ulcer to the right lower extremity. He states that he fell down his porch steps and developed a hematoma. He subsequently developed right lower extremity cellulitis and completed a 10-day course of Bactrim and received an injection of Rocephin. The hematoma was eventually ALEXIO, SROKA ([id number]) [id number] Page 4 of 6 evacuated by his primary care physician. Has been packing the area with iodoform once weekly at his primary care physician's office. Since having the area evacuated his symptoms of increased swelling, erythema and pain have subsided.  He currently denies signs of infection. 12/14; patient presents for follow-up. We have been using silver alginate with antibiotic ointment under 3 layer compression. He tolerated the wrap well. He was hoping to be able to use his work boots and states that the wrap is too thick. We will switch to a 2 layer Coflex to see if this helps. 12/21; patient presents for follow-up. We have been using silver alginate and antibiotic ointment under 2 layer Coflex. He has tolerated this well. He has shown improvement in wound healing. 12/28; Patient presents for follow-up. We have been using silver alginate and antibiotic ointment under Kerlix/Coban. There has been a mixup with the 2 layer Coflex. Either way he continues to improve in wound healing. He denies signs of infection. 1/4; patient presents for follow-up. At last clinic visit we used endoform with antibiotic ointment under 2 layer Coflex. Patient did well  with this and his wound is healed. Patient History Family History Diabetes - Father. Social History Never smoker, Marital Status - Married, Alcohol Use - Daily - few drinks a day, Drug Use - No History, Caffeine Use - Daily. Medical History Cardiovascular Patient has history of Arrhythmia - A-fib, Coronary Artery Disease, Hypertension Musculoskeletal Patient has history of Gout Hospitalization/Surgery History - right mastoidectomy 2013. - 2009 left leg reconstruction. - 1970 right shoulder reconstruction. - back surgery 0712 [id number]. - arthroscopy x2 2006 2008 left. 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 Vitals Time Taken: 12:51 PM, Height: 69 in, Weight:  240 lbs, BMI: 35.4, Temperature: 98.3 F, Pulse: 103 bpm, Respiratory Rate: 16 breaths/min, Blood Pressure: 171/100 mmHg. Cardiovascular 2+ dorsalis pedis/posterior tibialis pulses. Psychiatric pleasant and cooperative. General Notes: Right lower extremity: T the lateral aspect there is epithelization to the previous wound site. Decent edema control. Venous stasis dermatitis. o Integumentary (Hair, Skin) Wound #1 status is Healed - Epithelialized. Original cause of wound was Trauma. The date acquired was: 06/11/2022. The wound has been in treatment 4 weeks. The wound is located on the Right,Lateral Lower Leg. The wound measures 0cm length x 0cm width x 0cm depth; 0cm^2 area and 0cm^3 volume. There is no tunneling or undermining noted. There is a none present amount of drainage noted. The wound margin is distinct with the outline attached to the wound base. There is no granulation within the wound bed. There is no necrotic tissue within the wound bed. The periwound skin appearance did not exhibit: Callus, Crepitus, Excoriation, Induration, Rash, Scarring, Dry/Scaly, Maceration, Atrophie Blanche, Cyanosis, Ecchymosis, Hemosiderin Staining, Mottled, Pallor, Rubor, Erythema. Assessment Active Problems ICD-10 Non-pressure chronic ulcer of other part of right lower leg with fat layer exposed Other early complications of trauma, initial encounter REMIEL, CORTI ([id number]) [id number] Page 5 of 6 Chronic venous hypertension (idiopathic) with ulcer of right lower extremity Essential (primary) hypertension Patient has done well with endoform and antibiotic ointment. His wound is healed. At this time I recommended Tubigrip daily for the next 1 to 2 weeks to help control swelling. Since this was a trauma wound he likely does not need compression stockings daily. Plan Discharge From [facility] Services: Discharge from [place] - call if any future wound care needs. wear  the compression garment tubigrip size E for one week. 1. Discharge from clinic due to closed wound 2. Follow-up as needed 3. Tubigrip daily for 1-2 weeks Electronic Signature(s) Signed: 08/21/2022 2:54:58 PM By: Kalman Shan DO Entered By: Kalman Shan on 08/21/2022 13:35:21 -------------------------------------------------------------------------------- HxROS Details Patient Name: Date of Service: Donald Butler LFO RD K. 08/21/2022 12:30 PM Medical Record Number: [id number] Patient Account Number: [id number] Date of Birth/Sex: Treating RN: Oct 30, 1960 (62 y.o. 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) Cardiovascular Medical History: Positive for: Arrhythmia - A-fib; Coronary Artery Disease; Hypertension Past Medical History Notes: lyperlipidemia Gastrointestinal Medical History: Past Medical History Notes: acid reflux Genitourinary Medical History: Past Medical History Notes: internial hemrrhoid Musculoskeletal Medical History: Positive for: Gout Neurologic Medical History: Past Medical History Notes: left leg perineal nerve damage Braaten, Ragan K ([id number])  [id number] Page 6 of 6 Immunizations Pneumococcal Vaccine: Received Pneumococcal Vaccination: No Implantable Devices None Hospitalization / Surgery History Type of Hospitalization/Surgery right mastoidectomy 2013 2009 left leg reconstruction 1970 right shoulder reconstruction back surgery 5631 [id number] arthroscopy x2 2006 2008 left Family and Social History Diabetes: Yes - Father; Never smoker; Marital Status - Married; Alcohol Use: Daily - few drinks a day; Drug Use: No History; Caffeine Use: Daily;  Financial Concerns: No; Food, Clothing or Shelter Needs: No; Support System Lacking: No; Transportation Concerns: No Electronic Signature(s) Signed: 08/21/2022 2:54:58 PM By: Kalman Shan DO Entered By: Kalman Shan on 08/21/2022 13:30:41 -------------------------------------------------------------------------------- SuperBill Details Patient Name: Date of Service: Donald Butler LFO RD K. 08/21/2022 Medical Record Number: [id number] Patient Account Number: [id number] Date of Birth/Sex: Treating RN: 1961/04/13 (62 y.o. Donald Butler Primary Care Provider: Hassell Done, [name] Other Clinician: Referring Provider: Treating Provider/Extender: Cathleen Fears, [name] Weeks in Treatment: 4 Diagnosis Coding ICD-10 Codes Code Description [phone] Non-pressure chronic ulcer of other part of right lower leg with fat layer exposed T79.8XXA Other early complications of trauma, initial encounter I87.311 Chronic venous hypertension (idiopathic) with ulcer of right lower extremity I10 Essential (primary) hypertension Facility Procedures : CPT4 Code: [id number] Description: 99213 - WOUND CARE VISIT-LEV 3 EST PT Modifier: Quantity: 1 Physician Procedures : CPT4 Code Description Modifier [id number] - WC PHYS LEVEL 3 - EST PT ICD-10 Diagnosis Description L97.812 Non-pressure chronic ulcer of other part of right lower leg with fat layer exposed T79.8XXA Other early complications of trauma, initial  encounter I87.311 Chronic venous hypertension (idiopathic) with ulcer of right lower extremity I10 Essential (primary) hypertension Quantity: 1 Electronic Signature(s) Signed: 08/21/2022 2:54:58 PM By: Kalman Shan DO Entered By: Kalman Shan on 08/21/2022 13:35:34

## 2022-08-22 NOTE — Progress Notes (Signed)
MILLER, LIMEHOUSE ([id number]) [phone].pdf Page 1 of 8 Visit Report for 08/21/2022 Arrival Information Details Patient Name: Date of Service: AVANT, PRINTY LFO RD K. 08/21/2022 12:30 PM Medical Record Number: [id number] Patient Account Number: [id number] Date of Birth/Sex: Treating RN: 29-Sep-1960 (62 y.o. [name] Primary Care Dawon Troop: Hassell Done, [name] Other Clinician: Referring Riham Polyakov: Treating Marsela Kuan/Extender: Cathleen Fears, [name] Weeks in Treatment: 4 Visit Information History Since Last Visit Added or deleted any medications: No Patient Arrived: Ambulatory Any new allergies or adverse reactions: No Arrival Time: 12:50 Had a fall or experienced change in No Accompanied By: self activities of daily living that may affect Transfer Assistance: None risk of falls: Patient Identification Verified: Yes Signs or symptoms of abuse/neglect since last visito No Secondary Verification Process Completed: Yes Hospitalized since last visit: No Patient Requires Transmission-Based Precautions: No Implantable device outside of the clinic excluding No Patient Has Alerts: Yes cellular tissue based products placed in the center Patient Alerts: Patient on Blood Thinner since last visit: Aspirin '81mg'$  Has Dressing in Place as Prescribed: Yes Has Compression in Place as Prescribed: Yes Pain Present Now: No Electronic Signature(s) Signed: 08/21/2022 6:11:27 PM By: Deon Pilling RN, BSN Entered By: Deon Pilling on 08/21/2022 12:51:41 -------------------------------------------------------------------------------- Clinic Level of Care Assessment Details Patient Name: Date of Service: Gruber, A LFO RD K. 08/21/2022 12:30 PM Medical Record Number: [id number] Patient Account Number: [id number] Date of Birth/Sex: Treating RN: [birth year] (62 y.o. [name] Primary Care Stephaie Dardis: Hassell Done, [name] Other Clinician: Referring Oma Marzan: Treating  Consuelo Suthers/Extender: Cathleen Fears, [name] Weeks in Treatment: 4 Clinic Level of Care Assessment Items TOOL 4 Quantity Score X- 1 0 Use when only an EandM is performed on FOLLOW-UP visit ASSESSMENTS - Nursing Assessment / Reassessment X- 1 10 Reassessment of Co-morbidities (includes updates in patient status) X- 1 5 Reassessment of Adherence to Treatment Plan ASSESSMENTS - Wound and Skin A ssessment / Reassessment X - Simple Wound Assessment / Reassessment - one wound 1 5 '[]'$  - 0 Complex Wound Assessment / Reassessment - multiple wounds '[]'$  - 0 Dermatologic / Skin Assessment (not related to wound area) ASSESSMENTS - Focused Assessment X- 1 5 Circumferential Edema Measurements - multi extremities '[]'$  - 0 Nutritional Assessment / Counseling / Intervention SHERIF, MILLSPAUGH ([id number]) [id number] Page 2 of 8 '[]'$  - 0 Lower Extremity Assessment (monofilament, tuning fork, pulses) '[]'$  - 0 Peripheral Arterial Disease Assessment (using hand held doppler) ASSESSMENTS - Ostomy and/or Continence Assessment and Care '[]'$  - 0 Incontinence Assessment and Management '[]'$  - 0 Ostomy Care Assessment and Management (repouching, etc.) PROCESS - Coordination of Care X - Simple Patient / Family Education for ongoing care 1 15 '[]'$  - 0 Complex (extensive) Patient / Family Education for ongoing care X- 1 10 Staff obtains Programmer, systems, Records, T Results / Process Orders est '[]'$  - 0 Staff telephones HHA, Nursing Homes / Clarify orders / etc '[]'$  - 0 Routine Transfer to another Facility (non-emergent condition) '[]'$  - 0 Routine Hospital Admission (non-emergent condition) '[]'$  - 0 New Admissions / Biomedical engineer / Ordering NPWT Apligraf, etc. , '[]'$  - 0 Emergency Hospital Admission (emergent condition) X- 1 10 Simple Discharge Coordination '[]'$  - 0 Complex (extensive) Discharge Coordination PROCESS - Special Needs '[]'$  - 0 Pediatric / Minor Patient Management '[]'$  -  0 Isolation Patient Management '[]'$  - 0 Hearing / Language / Visual special needs '[]'$  - 0 Assessment of Community assistance (transportation, D/C planning, etc.) '[]'$  - 0 Additional assistance / Altered mentation '[]'$  - 0 Support  Surface(s) Assessment (bed, cushion, seat, etc.) INTERVENTIONS - Wound Cleansing / Measurement X - Simple Wound Cleansing - one wound 1 5 '[]'$  - 0 Complex Wound Cleansing - multiple wounds X- 1 5 Wound Imaging (photographs - any number of wounds) '[]'$  - 0 Wound Tracing (instead of photographs) X- 1 5 Simple Wound Measurement - one wound '[]'$  - 0 Complex Wound Measurement - multiple wounds INTERVENTIONS - Wound Dressings '[]'$  - 0 Small Wound Dressing one or multiple wounds '[]'$  - 0 Medium Wound Dressing one or multiple wounds '[]'$  - 0 Large Wound Dressing one or multiple wounds '[]'$  - 0 Application of Medications - topical '[]'$  - 0 Application of Medications - injection INTERVENTIONS - Miscellaneous '[]'$  - 0 External ear exam '[]'$  - 0 Specimen Collection (cultures, biopsies, blood, body fluids, etc.) '[]'$  - 0 Specimen(s) / Culture(s) sent or taken to Lab for analysis '[]'$  - 0 Patient Transfer (multiple staff / Civil Service fast streamer / Similar devices) '[]'$  - 0 Simple Staple / Suture removal (25 or less) '[]'$  - 0 Complex Staple / Suture removal (26 or more) '[]'$  - 0 Hypo / Hyperglycemic Management (close monitor of Blood Glucose) Dillehay, Aidric K ([id number]) [id number] Page 3 of 8 '[]'$  - 0 Ankle / Brachial Index (ABI) - do not check if billed separately X- 1 5 Vital Signs Has the patient been seen at the hospital within the last three years: Yes Total Score: 80 Level Of Care: New/Established - Level 3 Electronic Signature(s) Signed: 08/21/2022 6:11:27 PM By: Deon Pilling RN, BSN Entered By: Deon Pilling on 08/21/2022 13:01:38 -------------------------------------------------------------------------------- Encounter Discharge Information Details Patient Name:  Date of Service: Dannial Monarch LFO RD K. 08/21/2022 12:30 PM Medical Record Number: [id number] Patient Account Number: [id number] Date of Birth/Sex: Treating RN: 12-Feb-1961 (62 y.o. [name] Primary Care Izaiyah Kleinman: Hassell Done, [name] Other Clinician: Referring Kloe Oates: Treating Starlyn Droge/Extender: Cathleen Fears, [name] Weeks in Treatment: 4 Encounter Discharge Information Items Discharge Condition: Stable Ambulatory Status: Ambulatory Discharge Destination: Home Transportation: Private Auto Accompanied By: self Schedule Follow-up Appointment: No Clinical Summary of Care: Notes tubigrip size E. Electronic Signature(s) Signed: 08/21/2022 6:11:27 PM By: Deon Pilling RN, BSN Entered By: Deon Pilling on 08/21/2022 13:02:35 -------------------------------------------------------------------------------- Lower Extremity Assessment Details Patient Name: Date of Service: Dannial Monarch LFO RD K. 08/21/2022 12:30 PM Medical Record Number: [id number] Patient Account Number: [id number] Date of Birth/Sex: Treating RN: August 09, 1961 (62 y.o. [name] Primary Care Raianna Slight: Hassell Done, [name] Other Clinician: Referring Porche Steinberger: Treating Halei Hanover/Extender: Cathleen Fears, [name] Weeks in Treatment: 4 Edema Assessment Assessed: [Left: No] [Right: Yes] Edema: [Left: N] [Right: o] Calf Left: Right: Point of Measurement: From Medial Instep 41.5 cm Ankle Left: Right: Point of Measurement: From Medial Instep 26 cm Vanasten, Hannah K ([id number]) [id number] Page 4 of 8 Vascular Assessment Pulses: Dorsalis Pedis Palpable: [Right:Yes] Electronic Signature(s) Signed: 08/21/2022 6:11:27 PM By: Deon Pilling RN, BSN Entered By: Deon Pilling on 08/21/2022 12:51:58 -------------------------------------------------------------------------------- Multi Wound Chart Details Patient Name: Date of Service: Dannial Monarch LFO RD K. 08/21/2022 12:30  PM Medical Record Number: [id number] Patient Account Number: [id number] Date of Birth/Sex: Treating RN: Oct 25, 1960 (62 y.o. M) Primary Care Derald Lorge: Hassell Done, [name] Other Clinician: Referring Skipper Dacosta: Treating Lucila Klecka/Extender: Cathleen Fears, [name] Weeks in Treatment: 4 Vital Signs Height(in): 69 Pulse(bpm): 103 Weight(lbs): 240 Blood Pressure(mmHg): 171/100 Body Mass Index(BMI): 35.4 Temperature(F): 98.3 Respiratory Rate(breaths/min): 16 [1:Photos:] [N/A:N/A] Right, Lateral Lower Leg N/A N/A Wound Location: Trauma N/A N/A Wounding Event: Venous Leg Ulcer N/A N/A Primary Etiology: Arrhythmia, Coronary Artery Disease,  N/A N/A Comorbid History: Hypertension, Gout 06/11/2022 N/A N/A Date Acquired: 4 N/A N/A Weeks of Treatment: Healed - Epithelialized N/A N/A Wound Status: No N/A N/A Wound Recurrence: 0x0x0 N/A N/A Measurements L x W x D (cm) 0 N/A N/A A (cm) : rea 0 N/A N/A Volume (cm) : 100.00% N/A N/A % Reduction in Area: 100.00% N/A N/A % Reduction in Volume: Full Thickness Without Exposed N/A N/A Classification: Support Structures None Present N/A N/A Exudate Amount: Distinct, outline attached N/A N/A Wound Margin: None Present (0%) N/A N/A Granulation Amount: None Present (0%) N/A N/A Necrotic Amount: Fascia: No N/A N/A Exposed Structures: Fat Layer (Subcutaneous Tissue): No Tendon: No Muscle: No Joint: No Bone: No Large (67-100%) N/A N/A Epithelialization: Excoriation: No N/A N/A Periwound Skin Texture: Induration: No Callus: No Crepitus: No Rash: No KELWIN, GIBLER ([id number]) [id number] Page 5 of 8 Scarring: No Maceration: No N/A N/A Periwound Skin Moisture: Dry/Scaly: No Atrophie Blanche: No N/A N/A Periwound Skin Color: Cyanosis: No Ecchymosis: No Erythema: No Hemosiderin Staining: No Mottled: No Pallor: No Rubor: No Treatment Notes Electronic Signature(s) Signed:  08/21/2022 2:54:58 PM By: Kalman Shan DO Entered By: Kalman Shan on 08/21/2022 13:30:08 -------------------------------------------------------------------------------- Multi-Disciplinary Care Plan Details Patient Name: Date of Service: Dannial Monarch LFO RD K. 08/21/2022 12:30 PM Medical Record Number: [id number] Patient Account Number: [id number] Date of Birth/Sex: Treating RN: March 27, 1961 (62 y.o. [name] Primary Care Filbert Craze: Hassell Done, [name] Other Clinician: Referring Lari Linson: Treating Lurlean Kernen/Extender: Cathleen Fears, [name] Weeks in Treatment: 4 Active Inactive Electronic Signature(s) Signed: 08/21/2022 6:11:27 PM By: Deon Pilling RN, BSN Entered By: Deon Pilling on 08/21/2022 12:59:54 -------------------------------------------------------------------------------- Pain Assessment Details Patient Name: Date of Service: Dannial Monarch LFO RD K. 08/21/2022 12:30 PM Medical Record Number: [id number] Patient Account Number: [id number] Date of Birth/Sex: Treating RN: [birth year] (62 y.o. [name] Primary Care Zethan Alfieri: Hassell Done, [name] Other Clinician: Referring Oniel Meleski: Treating Deliliah Spranger/Extender: Cathleen Fears, [name] Weeks in Treatment: 4 Active Problems Location of Pain Severity and Description of Pain Patient Has Paino No Site Locations Rate the pain. CRESPIN, FORSTROM ([id number]) [phone].pdf Page 6 of 8 Rate the pain. Current Pain Level: 0 Pain Management and Medication Current Pain Management: Medication: No Cold Application: No Rest: No Massage: No Activity: No T.E.N.S.: No Heat Application: No Leg drop or elevation: No Is the Current Pain Management Adequate: Adequate How does your wound impact your activities of daily livingo Sleep: No Bathing: No Appetite: No Relationship With Others: No Bladder Continence: No Emotions: No Bowel Continence: No Work: No Toileting: No Drive:  No Dressing: No Hobbies: No Engineer, maintenance) Signed: 08/21/2022 6:11:27 PM By: Deon Pilling RN, BSN Entered By: Deon Pilling on 08/21/2022 12:51:52 -------------------------------------------------------------------------------- Patient/Caregiver Education Details Patient Name: Date of Service: Kristie Cowman RD K. 1/4/2024andnbsp12:30 PM Medical Record Number: [id number] Patient Account Number: [id number] Date of Birth/Gender: Treating RN: June 10, 1961 (62 y.o. [name] Primary Care Physician: Hassell Done, [name] Other Clinician: Referring Physician: Treating Physician/Extender: Cathleen Fears, [name] Weeks in Treatment: 4 Education Assessment Education Provided To: Patient Education Topics Provided Wound/Skin Impairment: Handouts: Caring for Your Ulcer Methods: Explain/Verbal Responses: Reinforcements needed Electronic Signature(s) Signed: 08/21/2022 6:11:27 PM By: Deon Pilling RN, BSN Entered By: Deon Pilling on 08/21/2022 12:52:48 [name] ([id number]) [id number] Page 7 of 8 -------------------------------------------------------------------------------- Wound Assessment Details Patient Name: Date of Service: ETHEN, BANNAN LFO RD K. 08/21/2022 12:30 PM Medical Record Number: [id number] Patient Account Number: [id number] Date of Birth/Sex: Treating RN: [birth year] (62 y.o. M) Rolin Barry, Tammi Klippel  Primary Care Senay Sistrunk: Hassell Done, [name] Other Clinician: Referring Cheila Wickstrom: Treating Davyn Morandi/Extender: Cathleen Fears, [name] Weeks in Treatment: 4 Wound Status Wound Number: 1 Primary Etiology: Venous Leg Ulcer Wound Location: Right, Lateral Lower Leg Wound Status: Healed - Epithelialized Wounding Event: Trauma Comorbid History: Arrhythmia, Coronary Artery Disease, Hypertension, Gout Date Acquired: 06/11/2022 Weeks Of Treatment: 4 Clustered Wound: No Photos Wound Measurements Length: (cm) Width:  (cm) Depth: (cm) Area: (cm) Volume: (cm) 0 % Reduction in Area: 100% 0 % Reduction in Volume: 100% 0 Epithelialization: Large (67-100%) 0 Tunneling: No 0 Undermining: No Wound Description Classification: Full Thickness Without Exposed Support Wound Margin: Distinct, outline attached Exudate Amount: None Present Structures Foul Odor After Cleansing: No Slough/Fibrino No Wound Bed Granulation Amount: None Present (0%) Exposed Structure Necrotic Amount: None Present (0%) Fascia Exposed: No Fat Layer (Subcutaneous Tissue) Exposed: No Tendon Exposed: No Muscle Exposed: No Joint Exposed: No Bone Exposed: No Periwound Skin Texture Texture Color No Abnormalities Noted: No No Abnormalities Noted: No Callus: No Atrophie Blanche: No Crepitus: No Cyanosis: No Excoriation: No Ecchymosis: No Induration: No Erythema: No Rash: No Hemosiderin Staining: No Scarring: No Mottled: No Pallor: No Moisture Rubor: No No Abnormalities Noted: No Dry / Scaly: No Maceration: No JOZIAH, DOLLINS ([id number]) [id number].pdf Page 8 of 8 Electronic Signature(s) Signed: 08/21/2022 6:11:27 PM By: Deon Pilling RN, BSN Entered By: Deon Pilling on 08/21/2022 12:59:06 -------------------------------------------------------------------------------- Vitals Details Patient Name: Date of Service: Dannial Monarch LFO RD K. 08/21/2022 12:30 PM Medical Record Number: [id number] Patient Account Number: [id number] Date of Birth/Sex: Treating RN: 08-02-1961 (62 y.o. [name] Primary Care Chong January: Hassell Done, [name] Other Clinician: Referring Addaleigh Nicholls: Treating Nallely Yost/Extender: Cathleen Fears, [name] Weeks in Treatment: 4 Vital Signs Time Taken: 12:51 Temperature (F): 98.3 Height (in): 69 Pulse (bpm): 103 Weight (lbs): 240 Respiratory Rate (breaths/min): 16 Body Mass Index (BMI): 35.4 Blood Pressure (mmHg): 171/100 Reference Range: 80 - 120 mg /  dl Electronic Signature(s) Signed: 08/21/2022 6:11:27 PM By: Deon Pilling RN, BSN Entered By: Deon Pilling on 08/21/2022 12:51:29

## 2022-08-25 ENCOUNTER — Ambulatory Visit (INDEPENDENT_AMBULATORY_CARE_PROVIDER_SITE_OTHER): Payer: Medicare Other | Admitting: Nurse Practitioner

## 2022-08-25 ENCOUNTER — Ambulatory Visit: Payer: Medicare Other | Admitting: Nurse Practitioner

## 2022-08-25 ENCOUNTER — Encounter: Payer: Self-pay | Admitting: Nurse Practitioner

## 2022-08-25 VITALS — BP 138/79 | HR 83 | Temp 97.9°F | Resp 20 | Ht 68.0 in | Wt 246.0 lb

## 2022-08-25 DIAGNOSIS — K219 Gastro-esophageal reflux disease without esophagitis: Secondary | ICD-10-CM | POA: Diagnosis not present

## 2022-08-25 DIAGNOSIS — I2510 Atherosclerotic heart disease of native coronary artery without angina pectoris: Secondary | ICD-10-CM

## 2022-08-25 DIAGNOSIS — E782 Mixed hyperlipidemia: Secondary | ICD-10-CM

## 2022-08-25 DIAGNOSIS — R931 Abnormal findings on diagnostic imaging of heart and coronary circulation: Secondary | ICD-10-CM

## 2022-08-25 DIAGNOSIS — Z6836 Body mass index (BMI) 36.0-36.9, adult: Secondary | ICD-10-CM

## 2022-08-25 DIAGNOSIS — M109 Gout, unspecified: Secondary | ICD-10-CM | POA: Diagnosis not present

## 2022-08-25 DIAGNOSIS — Z23 Encounter for immunization: Secondary | ICD-10-CM

## 2022-08-25 DIAGNOSIS — Z1211 Encounter for screening for malignant neoplasm of colon: Secondary | ICD-10-CM

## 2022-08-25 DIAGNOSIS — I10 Essential (primary) hypertension: Secondary | ICD-10-CM

## 2022-08-25 DIAGNOSIS — K5790 Diverticulosis of intestine, part unspecified, without perforation or abscess without bleeding: Secondary | ICD-10-CM | POA: Diagnosis not present

## 2022-08-25 DIAGNOSIS — M1A0710 Idiopathic chronic gout, right ankle and foot, without tophus (tophi): Secondary | ICD-10-CM

## 2022-08-25 MED ORDER — ALLOPURINOL 300 MG PO TABS: 300.0000 mg | ORAL_TABLET | Freq: Every day | ORAL | 1 refills | Status: DC

## 2022-08-25 MED ORDER — ZEPBOUND 2.5 MG/0.5ML ~~LOC~~ SOAJ: 2.5000 mg | SUBCUTANEOUS | 2 refills | Status: DC

## 2022-08-25 MED ORDER — AMLODIPINE BESY-BENAZEPRIL HCL 10-40 MG PO CAPS: 1.0000 | ORAL_CAPSULE | Freq: Every day | ORAL | 3 refills | Status: DC

## 2022-08-25 MED ORDER — ATORVASTATIN CALCIUM 40 MG PO TABS: 40.0000 mg | ORAL_TABLET | Freq: Every day | ORAL | 3 refills | Status: DC

## 2022-08-25 MED ORDER — OMEPRAZOLE MAGNESIUM 20 MG PO TBEC: 20.0000 mg | DELAYED_RELEASE_TABLET | Freq: Two times a day (BID) | ORAL | 2 refills | Status: DC

## 2022-08-25 NOTE — Patient Instructions (Signed)
Calorie Counting for Weight Loss Calories are units of energy. Your body needs a certain number of calories from food to keep going throughout the day. When you eat or drink more calories than your body needs, your body stores the extra calories mostly as fat. When you eat or drink fewer calories than your body needs, your body burns fat to get the energy it needs. Calorie counting means keeping track of how many calories you eat and drink each day. Calorie counting can be helpful if you need to lose weight. If you eat fewer calories than your body needs, you should lose weight. Ask your health care provider what a healthy weight is for you. For calorie counting to work, you will need to eat the right number of calories each day to lose a healthy amount of weight per week. A dietitian can help you figure out how many calories you need in a day and will suggest ways to reach your calorie goal. A healthy amount of weight to lose each week is usually 1-2 lb (0.5-0.9 kg). This usually means that your daily calorie intake should be reduced by 500-750 calories. Eating 1,200-1,500 calories a day can help most women lose weight. Eating 1,500-1,800 calories a day can help most men lose weight. What do I need to know about calorie counting? Work with your health care provider or dietitian to determine how many calories you should get each day. To meet your daily calorie goal, you will need to: Find out how many calories are in each food that you would like to eat. Try to do this before you eat. Decide how much of the food you plan to eat. Keep a food log. Do this by writing down what you ate and how many calories it had. To successfully lose weight, it is important to balance calorie counting with a healthy lifestyle that includes regular activity. Where do I find calorie information?  The number of calories in a food can be found on a Nutrition Facts label. If a food does not have a Nutrition Facts label, try  to look up the calories online or ask your dietitian for help. Remember that calories are listed per serving. If you choose to have more than one serving of a food, you will have to multiply the calories per serving by the number of servings you plan to eat. For example, the label on a package of bread might say that a serving size is 1 slice and that there are 90 calories in a serving. If you eat 1 slice, you will have eaten 90 calories. If you eat 2 slices, you will have eaten 180 calories. How do I keep a food log? After each time that you eat, record the following in your food log as soon as possible: What you ate. Be sure to include toppings, sauces, and other extras on the food. How much you ate. This can be measured in cups, ounces, or number of items. How many calories were in each food and drink. The total number of calories in the food you ate. Keep your food log near you, such as in a pocket-sized notebook or on an app or website on your mobile phone. Some programs will calculate calories for you and show you how many calories you have left to meet your daily goal. What are some portion-control tips? Know how many calories are in a serving. This will help you know how many servings you can have of a certain   food. Use a measuring cup to measure serving sizes. You could also try weighing out portions on a kitchen scale. With time, you will be able to estimate serving sizes for some foods. Take time to put servings of different foods on your favorite plates or in your favorite bowls and cups so you know what a serving looks like. Try not to eat straight from a food's packaging, such as from a bag or box. Eating straight from the package makes it hard to see how much you are eating and can lead to overeating. Put the amount you would like to eat in a cup or on a plate to make sure you are eating the right portion. Use smaller plates, glasses, and bowls for smaller portions and to prevent  overeating. Try not to multitask. For example, avoid watching TV or using your computer while eating. If it is time to eat, sit down at a table and enjoy your food. This will help you recognize when you are full. It will also help you be more mindful of what and how much you are eating. What are tips for following this plan? Reading food labels Check the calorie count compared with the serving size. The serving size may be smaller than what you are used to eating. Check the source of the calories. Try to choose foods that are high in protein, fiber, and vitamins, and low in saturated fat, trans fat, and sodium. Shopping Read nutrition labels while you shop. This will help you make healthy decisions about which foods to buy. Pay attention to nutrition labels for low-fat or fat-free foods. These foods sometimes have the same number of calories or more calories than the full-fat versions. They also often have added sugar, starch, or salt to make up for flavor that was removed with the fat. Make a grocery list of lower-calorie foods and stick to it. Cooking Try to cook your favorite foods in a healthier way. For example, try baking instead of frying. Use low-fat dairy products. Meal planning Use more fruits and vegetables. One-half of your plate should be fruits and vegetables. Include lean proteins, such as chicken, turkey, and fish. Lifestyle Each week, aim to do one of the following: 150 minutes of moderate exercise, such as walking. 75 minutes of vigorous exercise, such as running. General information Know how many calories are in the foods you eat most often. This will help you calculate calorie counts faster. Find a way of tracking calories that works for you. Get creative. Try different apps or programs if writing down calories does not work for you. What foods should I eat?  Eat nutritious foods. It is better to have a nutritious, high-calorie food, such as an avocado, than a food with  few nutrients, such as a bag of potato chips. Use your calories on foods and drinks that will fill you up and will not leave you hungry soon after eating. Examples of foods that fill you up are nuts and nut butters, vegetables, lean proteins, and high-fiber foods such as whole grains. High-fiber foods are foods with more than 5 g of fiber per serving. Pay attention to calories in drinks. Low-calorie drinks include water and unsweetened drinks. The items listed above may not be a complete list of foods and beverages you can eat. Contact a dietitian for more information. What foods should I limit? Limit foods or drinks that are not good sources of vitamins, minerals, or protein or that are high in unhealthy fats. These   include: Candy. Other sweets. Sodas, specialty coffee drinks, alcohol, and juice. The items listed above may not be a complete list of foods and beverages you should avoid. Contact a dietitian for more information. How do I count calories when eating out? Pay attention to portions. Often, portions are much larger when eating out. Try these tips to keep portions smaller: Consider sharing a meal instead of getting your own. If you get your own meal, eat only half of it. Before you start eating, ask for a container and put half of your meal into it. When available, consider ordering smaller portions from the menu instead of full portions. Pay attention to your food and drink choices. Knowing the way food is cooked and what is included with the meal can help you eat fewer calories. If calories are listed on the menu, choose the lower-calorie options. Choose dishes that include vegetables, fruits, whole grains, low-fat dairy products, and lean proteins. Choose items that are boiled, broiled, grilled, or steamed. Avoid items that are buttered, battered, fried, or served with cream sauce. Items labeled as crispy are usually fried, unless stated otherwise. Choose water, low-fat milk,  unsweetened iced tea, or other drinks without added sugar. If you want an alcoholic beverage, choose a lower-calorie option, such as a glass of wine or light beer. Ask for dressings, sauces, and syrups on the side. These are usually high in calories, so you should limit the amount you eat. If you want a salad, choose a garden salad and ask for grilled meats. Avoid extra toppings such as bacon, cheese, or fried items. Ask for the dressing on the side, or ask for olive oil and vinegar or lemon to use as dressing. Estimate how many servings of a food you are given. Knowing serving sizes will help you be aware of how much food you are eating at restaurants. Where to find more information Centers for Disease Control and Prevention: www.cdc.gov U.[place]. Department of Agriculture: myplate.gov Summary Calorie counting means keeping track of how many calories you eat and drink each day. If you eat fewer calories than your body needs, you should lose weight. A healthy amount of weight to lose per week is usually 1-2 lb (0.5-0.9 kg). This usually means reducing your daily calorie intake by 500-750 calories. The number of calories in a food can be found on a Nutrition Facts label. If a food does not have a Nutrition Facts label, try to look up the calories online or ask your dietitian for help. Use smaller plates, glasses, and bowls for smaller portions and to prevent overeating. Use your calories on foods and drinks that will fill you up and not leave you hungry shortly after a meal. This information is not intended to replace advice given to you by your health care provider. Make sure you discuss any questions you have with your health care provider. Document Revised: 09/15/2019 Document Reviewed: 09/15/2019 Elsevier Patient Education  2023 Elsevier Inc.  

## 2022-08-25 NOTE — Progress Notes (Signed)
Subjective:    Patient ID: Donald Butler, male    DOB: 11/18/1960, 62 y.o.   MRN: [id number]   Chief Complaint: Medical Management of Chronic Issues    HPI:  Donald Butler is a 62 y.o. who identifies as a male who was assigned male at birth.   Social history: Lives with: lives with wife Work history: retired   Scientist, forensic in today for follow up of the following chronic medical issues:  1. Primary hypertension No c/o chest pain, sob or headache. Does not check blood pressure at home. BP Readings from Last 3 Encounters:  08/25/22 138/79  07/18/22 (!) 153/84  07/14/22 138/78      2. Coronary artery disease involving native coronary artery of native heart without angina pectoris Last saw cardiology on 02/26/22. No changes were made to plan of care. He is to follow  up this month.  3. Elevated coronary artery calcium score  4. Mixed hyperlipidemia Doe snot watch diet and does no dedicated exercise.  5. Gastroesophageal reflux disease, unspecified whether esophagitis present Is on prilosec daily and is doing well.  6. Diverticulosis No recent flare ups  7. Gout, unspecified cause, unspecified chronicity, unspecified site No recent flare ups  8. Morbid obesity ([place]) No recent weight changes Wt Readings from Last 3 Encounters:  08/25/22 246 lb (111.6 kg)  07/18/22 239 lb (108.4 kg)  07/14/22 239 lb (108.4 kg)   BMI Readings from Last 3 Encounters:  08/25/22 37.40 kg/m  07/18/22 36.34 kg/m  07/14/22 36.34 kg/m      New complaints: None today  Allergies  Allergen Reactions   Penicillins Hives, Shortness Of Breath and Swelling   Outpatient Encounter Medications as of 08/25/2022  Medication Sig   albuterol (PROVENTIL HFA;VENTOLIN HFA) 108 (90 Base) MCG/ACT inhaler Inhale 2 puffs into the lungs every 6 (six) hours as needed for wheezing or shortness of breath.   allopurinol (ZYLOPRIM) 300 MG tablet Take 1 tablet (300 mg total) by mouth daily.    amLODipine-benazepril (LOTREL) 10-40 MG capsule Take 1 capsule by mouth daily.   aspirin EC 81 MG tablet Take 81 mg by mouth daily.    atorvastatin (LIPITOR) 40 MG tablet Take 1 tablet (40 mg total) by mouth daily.   colchicine 0.6 MG tablet Take 1 tablet (0.6 mg total) by mouth daily as needed.   HYDROcodone-acetaminophen (NORCO) 10-325 MG tablet Take 1 tablet by mouth every 8 (eight) hours as needed.   loratadine (CLARITIN) 10 MG tablet Take 10 mg by mouth daily.    Multiple Vitamin (MULTIVITAMIN) tablet Take 1 tablet by mouth daily.    omeprazole (PRILOSEC OTC) 20 MG tablet Take 1 tablet (20 mg total) by mouth 2 (two) times daily. TAKE ON AN EMPTY STOMACH. THEN WAIT ONE HOUR TO EAT   [DISCONTINUED] fenofibrate (TRICOR) 145 MG tablet Take 1 tablet (145 mg total) by mouth daily.   No facility-administered encounter medications on file as of 08/25/2022.    Past Surgical History:  Procedure Laterality Date   BACK SURGERY  1982, 1983   ruptured disk  x 2   CARPAL TUNNEL RELEASE Left    COLONOSCOPY     EXTERNAL EAR SURGERY     KNEE ARTHROSCOPY  2006, 2008   x 2  left   MASTOIDECTOMY  2013   right   OPEN ANTERIOR SHOULDER RECONSTRUCTION  1970   right   POLYPECTOMY     reconstruction left leg  2009   multiple surgery after  accident    Family History  Problem Relation Age of Onset   Diabetes Father    Colon cancer Neg Hx    Stomach cancer Neg Hx    Esophageal cancer Neg Hx    Rectal cancer Neg Hx    Pancreatic cancer Neg Hx       Controlled substance contract: n/a     Review of Systems  Constitutional:  Negative for diaphoresis.  Eyes:  Negative for pain.  Respiratory:  Negative for shortness of breath.   Cardiovascular:  Negative for chest pain, palpitations and leg swelling.  Gastrointestinal:  Negative for abdominal pain.  Endocrine: Negative for polydipsia.  Skin:  Negative for rash.  Neurological:  Negative for dizziness, weakness and headaches.  Hematological:   Does not bruise/bleed easily.  All other systems reviewed and are negative.      Objective:   Physical Exam Vitals and nursing note reviewed.  Constitutional:      Appearance: Normal appearance. He is well-developed.  HENT:     Head: Normocephalic.     Nose: Nose normal.     Mouth/Throat:     Mouth: Mucous membranes are moist.     Pharynx: Oropharynx is clear.  Eyes:     Pupils: Pupils are equal, round, and reactive to light.  Neck:     Thyroid: No thyroid mass or thyromegaly.     Vascular: No carotid bruit or JVD.     Trachea: Phonation normal.  Cardiovascular:     Rate and Rhythm: Normal rate and regular rhythm.  Pulmonary:     Effort: Pulmonary effort is normal. No respiratory distress.     Breath sounds: Normal breath sounds.  Abdominal:     General: Bowel sounds are normal.     Palpations: Abdomen is soft.     Tenderness: There is no abdominal tenderness.  Musculoskeletal:        General: Normal range of motion.     Cervical back: Normal range of motion and neck supple.  Lymphadenopathy:     Cervical: No cervical adenopathy.  Skin:    General: Skin is warm and dry.  Neurological:     Mental Status: He is alert and oriented to person, place, and time.  Psychiatric:        Behavior: Behavior normal.        Thought Content: Thought content normal.        Judgment: Judgment normal.     BP 138/79   Pulse 83   Temp 97.9 F (36.6 C) (Temporal)   Resp 20   Ht '5\' 8"'$  (1.727 m)   Wt 246 lb (111.6 kg)   SpO2 95%   BMI 37.40 kg/m        Assessment & Plan:   Donald Butler comes in today with chief complaint of Medical Management of Chronic Issues   Diagnosis and orders addressed:  1. Primary hypertension Low sodium diet - amLODipine-benazepril (LOTREL) 10-40 MG capsule; Take 1 capsule by mouth daily.  Dispense: 90 capsule; Refill: 3 - CBC with Differential/Platelet - CMP14+EGFR  2. Coronary artery disease involving native coronary artery of native  heart without angina pectoris  3. Elevated coronary artery calcium score Keep follow up with cardiology  4. Mixed hyperlipidemia Low fat diet - atorvastatin (LIPITOR) 40 MG tablet; Take 1 tablet (40 mg total) by mouth daily.  Dispense: 90 tablet; Refill: 3 - Lipid panel  5. Gastroesophageal reflux disease, unspecified whether esophagitis present Avoid spicy foods Do not  eat 2 hours prior to bedtime - omeprazole (PRILOSEC OTC) 20 MG tablet; Take 1 tablet (20 mg total) by mouth 2 (two) times daily. TAKE ON AN EMPTY STOMACH. THEN WAIT ONE HOUR TO EAT  Dispense: 60 tablet; Refill: 2  6. Diverticulosis Watch diet to prevent flare up  7. Gout, unspecified cause, unspecified chronicity, unspecified site  8. Morbid obesity ([place]) Discussed diet and exercise for person with BMI >25 Will recheck weight in 3-6 months  - tirzepatide (ZEPBOUND) 2.5 MG/0.5ML Pen; Inject 2.5 mg into the skin once a week.  Dispense: 2 mL; Refill: 2  9. Idiopathic chronic gout of right ankle without tophus - allopurinol (ZYLOPRIM) 300 MG tablet; Take 1 tablet (300 mg total) by mouth daily.  Dispense: 90 tablet; Refill: 1  10. Encounter for screening colonoscopy - Ambulatory referral to Gastroenterology   Labs pending Health Maintenance reviewed Diet and exercise encouraged  Follow up plan: 6 months   [name] Done, FNP

## 2022-08-25 NOTE — Addendum Note (Signed)
Addended by: Chevis Pretty on: 08/25/2022 09:52 AM   Modules accepted: Level of Service

## 2022-08-26 NOTE — Addendum Note (Signed)
Addended by: Rolena Infante on: 08/26/2022 09:47 AM   Modules accepted: Orders

## 2022-08-28 ENCOUNTER — Encounter (HOSPITAL_BASED_OUTPATIENT_CLINIC_OR_DEPARTMENT_OTHER): Payer: Medicare Other | Admitting: Internal Medicine

## 2022-09-07 DIAGNOSIS — I480 Paroxysmal atrial fibrillation: Secondary | ICD-10-CM | POA: Insufficient documentation

## 2022-09-07 NOTE — Progress Notes (Unsigned)
Cardiology Office Note   Date:  09/10/2022   ID:  Shigeru, Lampert 17-Jan-1961, MRN [id number]  PCP:  Chevis Pretty, FNP  Cardiologist:   Minus Breeding, MD   Chief Complaint  Patient presents with   Coronary Artery Disease     History of Present Illness:. Donald Butler is a 62 y.o. male who presents for dizziness.    Coronary CT angiography showed a coronary artery calcium score of 856 Agatston units placing him in the 97th percentile for age and gender, suggesting a high risk for future cardiac events.  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 Echocardiogram on 06/17/2018 showed normal left ventricular systolic function and regional wall motion, LVEF 60 to 65%, and mild mitral regurgitation.   He did have some palpitations in June 2023 had a monitor which a very short run of paroxysmal atrial fibrillation.  Since I last saw him he had no new cardiovascular problems.  He had a few paroxysms of atrial fibrillation but he does not think it is at all sustained.  He is having trouble with weight loss because he cannot exercise.  He wanted to take semaglutide but it was not approved.  He denies any new shortness of breath, PND or orthopnea.  He had no new chest pressure, neck or arm discomfort.  He has had no weight gain or edema.  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    Past Surgical History:  Procedure Laterality Date   BACK SURGERY  1982, 1983   ruptured disk  x 2   CARPAL TUNNEL RELEASE Left    COLONOSCOPY     EXTERNAL EAR SURGERY     KNEE ARTHROSCOPY  2006, 2008   x 2  left   MASTOIDECTOMY  2013   right   OPEN  ANTERIOR SHOULDER RECONSTRUCTION  1970   right   POLYPECTOMY     reconstruction left leg  2009   multiple surgery after accident     Current Outpatient Medications  Medication Sig Dispense Refill   albuterol (PROVENTIL HFA;VENTOLIN HFA) 108 (90 Base) MCG/ACT inhaler Inhale 2 puffs into the lungs every 6 (six) hours as needed for wheezing or shortness of breath. 1 Inhaler 2   allopurinol (ZYLOPRIM) 300 MG tablet Take 1 tablet (300 mg total) by mouth daily. 90 tablet 1   amLODipine-benazepril (LOTREL) 5-40 MG capsule Take 2 capsules by mouth daily. 180 capsule 3   aspirin EC 81 MG tablet Take 81 mg by mouth daily.      atorvastatin (LIPITOR) 40 MG tablet Take 1 tablet (40 mg total) by mouth daily. 90 tablet 3   colchicine 0.6 MG tablet Take 1 tablet (0.6 mg total) by mouth daily as needed. 30 tablet 1   HYDROcodone-acetaminophen (NORCO) 10-325 MG tablet Take 1 tablet by mouth every 8 (eight) hours as needed. 30 tablet 0   loratadine (CLARITIN) 10 MG tablet Take 10 mg by mouth daily.      Multiple Vitamin (MULTIVITAMIN) tablet Take 1 tablet by mouth daily.      omeprazole (  PRILOSEC OTC) 20 MG tablet Take 1 tablet (20 mg total) by mouth 2 (two) times daily. TAKE ON AN EMPTY STOMACH. THEN WAIT ONE HOUR TO EAT 60 tablet 2   tirzepatide (ZEPBOUND) 2.5 MG/0.5ML Pen Inject 2.5 mg into the skin once a week. (Patient not taking: Reported on 09/10/2022) 2 mL 2   No current facility-administered medications for this visit.    Allergies:   Penicillins   ROS:  Please see the history of present illness.   Otherwise, review of systems are positive none.   All other systems are reviewed and negative.    PHYSICAL EXAM: VS:  BP (!) 140/90   Pulse 96   Ht '5\' 8"'$  (1.727 m)   Wt 241 lb (109.3 kg)   BMI 36.64 kg/m  , BMI Body mass index is 36.64 kg/m. GENERAL:  Well appearing NECK:  No jugular venous distention, waveform within normal limits, carotid upstroke brisk and symmetric, no bruits, no  thyromegaly LUNGS:  Clear to auscultation bilaterally CHEST:  Unremarkable HEART:  PMI not displaced or sustained,S1 and S2 within normal limits, no S3, no S4, no clicks, no rubs, no murmurs ABD:  Flat, positive bowel sounds normal in frequency in pitch, no bruits, no rebound, no guarding, no midline pulsatile mass, no hepatomegaly, no splenomegaly EXT:  2 plus pulses throughout, no edema, no cyanosis no clubbing   EKG:  EKG is not ordered today. NA   Recent Labs: 02/20/2022: ALT 63; BUN 20; Creatinine, Ser 0.98; Potassium 4.4; Sodium 140 03/21/2022: Hemoglobin 15.7; Platelets 249    Lipid Panel    Component Value Date/Time   CHOL 164 02/20/2022 1210   TRIG 286 (H) 02/20/2022 1210   HDL 51 02/20/2022 1210   CHOLHDL 3.2 02/20/2022 1210   LDLCALC 68 02/20/2022 1210      Wt Readings from Last 3 Encounters:  09/10/22 241 lb (109.3 kg)  08/25/22 246 lb (111.6 kg)  07/18/22 239 lb (108.4 kg)      Other studies Reviewed: Additional studies/ records that were reviewed today include: Labs . Review of the above records demonstrates:  Please see elsewhere in the note.     ASSESSMENT AND PLAN:  ELEVATED CORONARY CALCIUM : We are going to continue to try risk reduction.  No further cardiovascular testing is suggested.  Hypertension:    His blood pressure is I am going to increase his amlodipine/benazepril to 5/40 take 2 pills daily.  He will keep a blood pressure diary.  Unfortunately he cannot tolerate CPAP and he does have sleep apnea so his blood pressure might be harder to control.  We talked about weight loss.    Mixed dyslipidemia:   LDL was 68 with an HDL of 51.  No change in therapy.   Atrial fibrillation: He has very short paroxysms of fibrillation.  He does not have an indication for anticoagulation at this point.   Obesity: We talked about reduce carbohydrates.    Elevated blood sugar: I will check an A1c.  Perhaps if he is diagnosed with diabetes he might qualify for  Ozempic.  So far his insurance company has turned him down.    Current medicines are reviewed at length with the patient today.  The patient does not have concerns regarding medicines.  The following changes have been made: None  Labs/ tests ordered today include:  None  Orders Placed This Encounter  Procedures   Hemoglobin A1c     Disposition:   FU with me in 12  months   Signed, Minus Breeding, MD  09/10/2022 11:27 AM    [facility]

## 2022-09-10 ENCOUNTER — Ambulatory Visit: Payer: Medicare Other | Admitting: Cardiology

## 2022-09-10 ENCOUNTER — Encounter: Payer: Self-pay | Admitting: Cardiology

## 2022-09-10 ENCOUNTER — Other Ambulatory Visit: Payer: Medicare Other

## 2022-09-10 ENCOUNTER — Other Ambulatory Visit: Payer: Self-pay | Admitting: *Deleted

## 2022-09-10 ENCOUNTER — Telehealth: Payer: Self-pay | Admitting: Cardiology

## 2022-09-10 VITALS — BP 140/90 | HR 96 | Ht 68.0 in | Wt 241.0 lb

## 2022-09-10 DIAGNOSIS — Z0000 Encounter for general adult medical examination without abnormal findings: Secondary | ICD-10-CM

## 2022-09-10 DIAGNOSIS — I2510 Atherosclerotic heart disease of native coronary artery without angina pectoris: Secondary | ICD-10-CM

## 2022-09-10 DIAGNOSIS — E119 Type 2 diabetes mellitus without complications: Secondary | ICD-10-CM | POA: Diagnosis not present

## 2022-09-10 DIAGNOSIS — I480 Paroxysmal atrial fibrillation: Secondary | ICD-10-CM | POA: Diagnosis not present

## 2022-09-10 DIAGNOSIS — E785 Hyperlipidemia, unspecified: Secondary | ICD-10-CM | POA: Diagnosis not present

## 2022-09-10 DIAGNOSIS — R931 Abnormal findings on diagnostic imaging of heart and coronary circulation: Secondary | ICD-10-CM

## 2022-09-10 MED ORDER — AMLODIPINE BESY-BENAZEPRIL HCL 5-40 MG PO CAPS: 2.0000 | ORAL_CAPSULE | Freq: Every day | ORAL | 3 refills | Status: DC

## 2022-09-10 NOTE — Patient Instructions (Signed)
Medication Instructions:  Please increase Lotrel to (2) 5-40 mg capsules for a total to 10-80 mg daily. Continue all other medications as listed.  *If you need a refill on your cardiac medications before your next appointment, please call your pharmacy*   Lab Work: Please have blood work today at [facility] ([place])  If you have labs (blood work) drawn today and your tests are completely normal, you will receive your results only by: [place] (if you have [place]) OR A paper copy in the mail If you have any lab test that is abnormal or we need to change your treatment, we will call you to review the results.   Follow-Up: At [facility], you and your health needs are our priority.  As part of our continuing mission to provide you with exceptional heart care, we have created designated Provider Care Teams.  These Care Teams include your primary Cardiologist (physician) and Advanced Practice Providers (APPs -  Physician Assistants and Nurse Practitioners) who all work together to provide you with the care you need, when you need it.  We recommend signing up for the patient portal called "MyChart".  Sign up information is provided on this After Visit Summary.  MyChart is used to connect with patients for Virtual Visits (Telemedicine).  Patients are able to view lab/test results, encounter notes, upcoming appointments, etc.  Non-urgent messages can be sent to your provider as well.   To learn more about what you can do with MyChart, go to [email or web address].    Your next appointment:   1 year(s)  Provider:   Minus Breeding, MD

## 2022-09-10 NOTE — Telephone Encounter (Signed)
Spoke to patient, he reports medication being increased this morning at his appointment with Dr. Percival Spanish.   He was suppose to increase to 5-40 mg Lotrel (take 2 capsules daily).  He had taken one tablet before appointment.   When he got home he took another but realized his capsules are 10-40 mg so today he has taken a total of 20-80 mg.     He wanted to make Dr. Percival Spanish aware and make sure he still recommended the change.    Advised if he feels lightheaded of dizzy to check BP today.  Will clarify with MD and call back.

## 2022-09-10 NOTE — Telephone Encounter (Signed)
Pt c/o medication issue:  1. Name of Medication: amLODipine-benazepril (LOTREL) 5-40 MG capsule   2. How are you currently taking this medication (dosage and times per day)?   3. Are you having a reaction (difficulty breathing--STAT)?   4. What is your medication issue? Pt states he may have misunderstood what Dr. Percival Spanish advised him on how to take this medication and believes he may have doubled up for today and would like advise as to how to continue.

## 2022-09-11 LAB — HEMOGLOBIN A1C
Est. average glucose Bld gHb Est-mCnc: 151 mg/dL
Hgb A1c MFr Bld: 6.9 % — ABNORMAL HIGH (ref 4.8–5.6)

## 2022-09-12 ENCOUNTER — Other Ambulatory Visit: Payer: Self-pay | Admitting: Nurse Practitioner

## 2022-09-12 ENCOUNTER — Ambulatory Visit (INDEPENDENT_AMBULATORY_CARE_PROVIDER_SITE_OTHER): Payer: Medicare Other

## 2022-09-12 VITALS — Ht 68.0 in | Wt 240.0 lb

## 2022-09-12 DIAGNOSIS — Z0000 Encounter for general adult medical examination without abnormal findings: Secondary | ICD-10-CM | POA: Diagnosis not present

## 2022-09-12 DIAGNOSIS — Z1211 Encounter for screening for malignant neoplasm of colon: Secondary | ICD-10-CM

## 2022-09-12 DIAGNOSIS — Z0100 Encounter for examination of eyes and vision without abnormal findings: Secondary | ICD-10-CM

## 2022-09-12 MED ORDER — OZEMPIC (0.25 OR 0.5 MG/DOSE) 2 MG/1.5ML ~~LOC~~ SOPN: PEN_INJECTOR | SUBCUTANEOUS | 1 refills | Status: DC

## 2022-09-12 NOTE — Patient Instructions (Signed)
Donald Butler , Thank you for taking time to come for your Medicare Wellness Visit. I appreciate your ongoing commitment to your health goals. Please review the following plan we discussed and let me know if I can assist you in the future.   These are the goals we discussed:  Goals      Patient Stated     07/25/2021 AWV Goal: Exercise for General Health  Patient will verbalize understanding of the benefits of increased physical activity: Exercising regularly is important. It will improve your overall fitness, flexibility, and endurance. Regular exercise also will improve your overall health. It can help you control your weight, reduce stress, and improve your bone density. Over the next year, patient will increase physical activity as tolerated with a goal of at least 150 minutes of moderate physical activity per week.  You can tell that you are exercising at a moderate intensity if your heart starts beating faster and you start breathing faster but can still hold a conversation. Moderate-intensity exercise ideas include: Walking 1 mile (1.6 km) in about 15 minutes Biking Hiking Golfing Dancing Water aerobics Patient will verbalize understanding of everyday activities that increase physical activity by providing examples like the following: Yard work, such as: Sales promotion account executive Gardening Washing windows or floors Patient will be able to explain general safety guidelines for exercising:  Before you start a new exercise program, talk with your health care provider. Do not exercise so much that you hurt yourself, feel dizzy, or get very short of breath. Wear comfortable clothes and wear shoes with good support. Drink plenty of water while you exercise to prevent dehydration or heat stroke. Work out until your breathing and your heartbeat get faster.         This is a list of the screening recommended for  you and due dates:  Health Maintenance  Topic Date Due   COVID-19 Vaccine (3 - Moderna risk series) 01/14/2020   Colon Cancer Screening  06/22/2022   Flu Shot  11/16/2022*   Zoster (Shingles) Vaccine (1 of 2) 11/24/2022*   Hepatitis C Screening: USPSTF Recommendation to screen - Ages 18-79 yo.  08/26/2023*   HIV Screening  08/26/2023*   Medicare Annual Wellness Visit  09/13/2023   DTaP/Tdap/Td vaccine (2 - Td or Tdap) 08/25/2032   HPV Vaccine  Aged Out  *Topic was postponed. The date shown is not the original due date.    Advanced directives: Please bring a copy of your health care power of attorney and living will to the office to be added to your chart at your convenience.   Conditions/risks identified: Aim for 30 minutes of exercise or brisk walking, 6-8 glasses of water, and 5 servings of fruits and vegetables each day.   Next appointment: Follow up in one year for your annual wellness visit.   Preventive Care 8 Years and Older, Male  Preventive care refers to lifestyle choices and visits with your health care provider that can promote health and wellness. What does preventive care include? A yearly physical exam. This is also called an annual well check. Dental exams once or twice a year. Routine eye exams. Ask your health care provider how often you should have your eyes checked. Personal lifestyle choices, including: Daily care of your teeth and gums. Regular physical activity. Eating a healthy diet. Avoiding tobacco and drug use. Limiting alcohol use. Practicing safe sex. Taking low doses of  aspirin every day. Taking vitamin and mineral supplements as recommended by your health care provider. What happens during an annual well check? The services and screenings done by your health care provider during your annual well check will depend on your age, overall health, lifestyle risk factors, and family history of disease. Counseling  Your health care provider may ask you  questions about your: Alcohol use. Tobacco use. Drug use. Emotional well-being. Home and relationship well-being. Sexual activity. Eating habits. History of falls. Memory and ability to understand (cognition). Work and work Statistician. Screening  You may have the following tests or measurements: Height, weight, and BMI. Blood pressure. Lipid and cholesterol levels. These may be checked every 5 years, or more frequently if you are over 63 years old. Skin check. Lung cancer screening. You may have this screening every year starting at age 26 if you have a 30-pack-year history of smoking and currently smoke or have quit within the past 15 years. Fecal occult blood test (FOBT) of the stool. You may have this test every year starting at age 67. Flexible sigmoidoscopy or colonoscopy. You may have a sigmoidoscopy every 5 years or a colonoscopy every 10 years starting at age 29. Prostate cancer screening. Recommendations will vary depending on your family history and other risks. Hepatitis C blood test. Hepatitis B blood test. Sexually transmitted disease (STD) testing. Diabetes screening. This is done by checking your blood sugar (glucose) after you have not eaten for a while (fasting). You may have this done every 1-3 years. Abdominal aortic aneurysm (AAA) screening. You may need this if you are a current or former smoker. Osteoporosis. You may be screened starting at age 74 if you are at high risk. Talk with your health care provider about your test results, treatment options, and if necessary, the need for more tests. Vaccines  Your health care provider may recommend certain vaccines, such as: Influenza vaccine. This is recommended every year. Tetanus, diphtheria, and acellular pertussis (Tdap, Td) vaccine. You may need a Td booster every 10 years. Zoster vaccine. You may need this after age 59. Pneumococcal 13-valent conjugate (PCV13) vaccine. One dose is recommended after age  70. Pneumococcal polysaccharide (PPSV23) vaccine. One dose is recommended after age 69. Talk to your health care provider about which screenings and vaccines you need and how often you need them. This information is not intended to replace advice given to you by your health care provider. Make sure you discuss any questions you have with your health care provider. Document Released: 08/31/2015 Document Revised: 04/23/2016 Document Reviewed: 06/05/2015 Elsevier Interactive Patient Education  2017 [place] Prevention in the Home Falls can cause injuries. They can happen to people of all ages. There are many things you can do to make your home safe and to help prevent falls. What can I do on the outside of my home? Regularly fix the edges of walkways and driveways and fix any cracks. Remove anything that might make you trip as you walk through a door, such as a raised step or threshold. Trim any bushes or trees on the path to your home. Use bright outdoor lighting. Clear any walking paths of anything that might make someone trip, such as rocks or tools. Regularly check to see if handrails are loose or broken. Make sure that both sides of any steps have handrails. Any raised decks and porches should have guardrails on the edges. Have any leaves, snow, or ice cleared regularly. Use sand or salt on  walking paths during winter. Clean up any spills in your garage right away. This includes oil or grease spills. What can I do in the bathroom? Use night lights. Install grab bars by the toilet and in the tub and shower. Do not use towel bars as grab bars. Use non-skid mats or decals in the tub or shower. If you need to sit down in the shower, use a plastic, non-slip stool. Keep the floor dry. Clean up any water that spills on the floor as soon as it happens. Remove soap buildup in the tub or shower regularly. Attach bath mats securely with double-sided non-slip rug tape. Do not have throw  rugs and other things on the floor that can make you trip. What can I do in the bedroom? Use night lights. Make sure that you have a light by your bed that is easy to reach. Do not use any sheets or blankets that are too big for your bed. They should not hang down onto the floor. Have a firm chair that has side arms. You can use this for support while you get dressed. Do not have throw rugs and other things on the floor that can make you trip. What can I do in the kitchen? Clean up any spills right away. Avoid walking on wet floors. Keep items that you use a lot in easy-to-reach places. If you need to reach something above you, use a strong step stool that has a grab bar. Keep electrical cords out of the way. Do not use floor polish or wax that makes floors slippery. If you must use wax, use non-skid floor wax. Do not have throw rugs and other things on the floor that can make you trip. What can I do with my stairs? Do not leave any items on the stairs. Make sure that there are handrails on both sides of the stairs and use them. Fix handrails that are broken or loose. Make sure that handrails are as long as the stairways. Check any carpeting to make sure that it is firmly attached to the stairs. Fix any carpet that is loose or worn. Avoid having throw rugs at the top or bottom of the stairs. If you do have throw rugs, attach them to the floor with carpet tape. Make sure that you have a light switch at the top of the stairs and the bottom of the stairs. If you do not have them, ask someone to add them for you. What else can I do to help prevent falls? Wear shoes that: Do not have high heels. Have rubber bottoms. Are comfortable and fit you well. Are closed at the toe. Do not wear sandals. If you use a stepladder: Make sure that it is fully opened. Do not climb a closed stepladder. Make sure that both sides of the stepladder are locked into place. Ask someone to hold it for you, if  possible. Clearly mark and make sure that you can see: Any grab bars or handrails. First and last steps. Where the edge of each step is. Use tools that help you move around (mobility aids) if they are needed. These include: Canes. Walkers. Scooters. Crutches. Turn on the lights when you go into a dark area. Replace any light bulbs as soon as they burn out. Set up your furniture so you have a clear path. Avoid moving your furniture around. If any of your floors are uneven, fix them. If there are any pets around you, be aware of where they  are. Review your medicines with your doctor. Some medicines can make you feel dizzy. This can increase your chance of falling. Ask your doctor what other things that you can do to help prevent falls. This information is not intended to replace advice given to you by your health care provider. Make sure you discuss any questions you have with your health care provider. Document Released: 05/31/2009 Document Revised: 01/10/2016 Document Reviewed: 09/08/2014 Elsevier Interactive Patient Education  2017 [employer].

## 2022-09-12 NOTE — Progress Notes (Signed)
Ozempic ordered  Will see if insurance will cover  [name] Done, FNP

## 2022-09-12 NOTE — Progress Notes (Signed)
Subjective:   Donald Butler is a 62 y.o. male who presents for Medicare Annual/Subsequent preventive examination. I connected with  [name] on 09/12/22 by a audio enabled telemedicine application and verified that I am speaking with the correct person using two identifiers.  Patient Location: Home  Provider Location: Home Office  I discussed the limitations of evaluation and management by telemedicine. The patient expressed understanding and agreed to proceed.  Review of Systems     Cardiac Risk Factors include: advanced age (>77mn, >>63women);diabetes mellitus;male gender;hypertension     Objective:    Today's Vitals   09/12/22 1356  Weight: 240 lb (108.9 kg)  Height: '5\' 8"'$  (1.727 m)   Body mass index is 36.49 kg/m.     09/12/2022    2:15 PM 07/25/2021    1:28 PM  Advanced Directives  Does Patient Have a Medical Advance Directive? Yes No  Type of AParamedicof [place] will   Copy of [place] Chart? No - copy requested   Would patient like information on creating a medical advance directive?  No - Patient declined    Current Medications (verified) Outpatient Encounter Medications as of 09/12/2022  Medication Sig   albuterol (PROVENTIL HFA;VENTOLIN HFA) 108 (90 Base) MCG/ACT inhaler Inhale 2 puffs into the lungs every 6 (six) hours as needed for wheezing or shortness of breath.   allopurinol (ZYLOPRIM) 300 MG tablet Take 1 tablet (300 mg total) by mouth daily.   amLODipine-benazepril (LOTREL) 5-40 MG capsule Take 2 capsules by mouth daily.   aspirin EC 81 MG tablet Take 81 mg by mouth daily.    atorvastatin (LIPITOR) 40 MG tablet Take 1 tablet (40 mg total) by mouth daily.   colchicine 0.6 MG tablet Take 1 tablet (0.6 mg total) by mouth daily as needed.   HYDROcodone-acetaminophen (NORCO) 10-325 MG tablet Take 1 tablet by mouth every 8 (eight) hours as needed.   loratadine (CLARITIN) 10 MG tablet Take 10 mg by  mouth daily.    Multiple Vitamin (MULTIVITAMIN) tablet Take 1 tablet by mouth daily.    omeprazole (PRILOSEC OTC) 20 MG tablet Take 1 tablet (20 mg total) by mouth 2 (two) times daily. TAKE ON AN EMPTY STOMACH. THEN WAIT ONE HOUR TO EAT   tirzepatide (ZEPBOUND) 2.5 MG/0.5ML Pen Inject 2.5 mg into the skin once a week. (Patient not taking: Reported on 09/12/2022)   No facility-administered encounter medications on file as of 09/12/2022.    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   Past Surgical History:  Procedure Laterality Date   BACK SURGERY  1982, 1983   ruptured disk  x 2   CARPAL TUNNEL RELEASE Left    COLONOSCOPY     EXTERNAL EAR SURGERY     KNEE ARTHROSCOPY  2006, 2008   x 2  left   MASTOIDECTOMY  2013   right   OPEN ANTERIOR SHOULDER RECONSTRUCTION  1970   right   POLYPECTOMY     reconstruction left leg  2009   multiple surgery after accident   Family History  Problem Relation Age of Onset   Diabetes Father    Colon cancer Neg Hx    Stomach cancer Neg Hx    Esophageal cancer Neg Hx    Rectal cancer Neg Hx    Pancreatic cancer Neg Hx    Social History   Socioeconomic History   Marital status: Married    Spouse name: Not on file   Number of children: Not on file   Years of education: Not on file   Highest education level: Not on file  Occupational History   Not on file  Tobacco Use   Smoking status: Former   Smokeless tobacco: Former    Types: Chew    Quit date: 06/22/2012  Vaping Use   Vaping Use: Never used  Substance and Sexual Activity   Alcohol use: Yes    Alcohol/week: 21.0 standard drinks of alcohol    Types: 21 Standard drinks or  equivalent per week    Comment: occ   Drug use: No   Sexual activity: Not on file  Other Topics Concern   Not on file  Social History Narrative   Not on file   Social Determinants of Health   Financial Resource Strain: Low Risk  (09/12/2022)   Overall Financial Resource Strain (CARDIA)    Difficulty of Paying Living Expenses: Not hard at all  Food Insecurity: No Food Insecurity (09/12/2022)   Hunger Vital Sign    Worried About Running Out of Food in the Last Year: Never true    Ran Out of Food in the Last Year: Never true  Transportation Needs: No Transportation Needs (09/12/2022)   PRAPARE - Hydrologist (Medical): No    Lack of Transportation (Non-Medical): No  Physical Activity: Insufficiently Active (09/12/2022)   Exercise Vital Sign    Days of Exercise per Week: 3 days    Minutes of Exercise per Session: 30 min  Stress: No Stress Concern Present (09/12/2022)   [place]    Feeling of Stress : Not at all  Social Connections: Moderately Isolated (09/12/2022)   Social Connection and Isolation Panel [NHANES]    Frequency of Communication with Friends and Family: More than three times a week    Frequency of Social Gatherings with Friends and Family: More than three times a week    Attends Religious Services: Never    Marine scientist or Organizations: No    Attends Music therapist: Never    Marital Status: Married    Tobacco Counseling Counseling given: Not Answered   Clinical Intake:  Pre-visit preparation completed: Yes  Pain : No/denies pain     Nutritional Risks: None Diabetes: Yes CBG done?: No Did pt. bring in CBG monitor from home?: No  How often do you need to have someone help you when you read instructions, pamphlets, or other written materials from your doctor or pharmacy?: 1 - Never  Diabetic?yes  Nutrition Risk Assessment:  Has the  patient had any N/V/D within the last 2 months?  No  Does the patient have any non-healing wounds?  No  Has the patient had any unintentional weight loss or weight gain?  No   Diabetes:  Is the patient diabetic?  Yes  If diabetic, was a CBG obtained today?  No  Did the patient bring in their glucometer from home?  No  How often do you monitor your CBG's? nevere.   Financial Strains and  Diabetes Management:  Are you having any financial strains with the device, your supplies or your medication? No .  Does the patient want to be seen by Chronic Care Management for management of their diabetes?  No  Would the patient like to be referred to a Nutritionist or for Diabetic Management?  No   Diabetic Exams:  Diabetic Eye Exam: Overdue for diabetic eye exam. Pt has been advised about the importance in completing this exam. Patient advised to call and schedule an eye exam. Diabetic Foot Exam: Overdue, Pt has been advised about the importance in completing this exam. Pt is scheduled for diabetic foot exam on next office visit .   Interpreter Needed?: No  Information entered by :: Jadene Pierini, LPN   Activities of Daily Living    09/12/2022    2:16 PM  In your present state of health, do you have any difficulty performing the following activities:  Hearing? 0  Vision? 0  Difficulty concentrating or making decisions? 0  Walking or climbing stairs? 0  Dressing or bathing? 0  Doing errands, shopping? 0  Preparing Food and eating ? N  Using the Toilet? N  In the past six months, have you accidently leaked urine? N  Do you have problems with loss of bowel control? N  Managing your Medications? N  Managing your Finances? N  Housekeeping or managing your Housekeeping? N    Patient Care Team: Chevis Pretty, FNP as PCP - General (Family Medicine) Minus Breeding, MD as PCP - Cardiology (Cardiology)  Indicate any recent Medical Services you may have received from other than [facility]  providers in the past year (date may be approximate).     Assessment:   This is a routine wellness examination for [place].  Hearing/Vision screen Vision Screening - Comments:: Wears rx glasses - up to date with routine eye exams with  Dr.Johnson   Dietary issues and exercise activities discussed: Current Exercise Habits: Home exercise routine, Type of exercise: walking, Time (Minutes): 30, Frequency (Times/Week): 3, Weekly Exercise (Minutes/Week): 90, Intensity: Mild, Exercise limited by: neurologic condition(s)   Goals Addressed             This Visit's Progress    Patient Stated   On track    07/25/2021 AWV Goal: Exercise for General Health  Patient will verbalize understanding of the benefits of increased physical activity: Exercising regularly is important. It will improve your overall fitness, flexibility, and endurance. Regular exercise also will improve your overall health. It can help you control your weight, reduce stress, and improve your bone density. Over the next year, patient will increase physical activity as tolerated with a goal of at least 150 minutes of moderate physical activity per week.  You can tell that you are exercising at a moderate intensity if your heart starts beating faster and you start breathing faster but can still hold a conversation. Moderate-intensity exercise ideas include: Walking 1 mile (1.6 km) in about 15 minutes Biking Hiking Golfing Dancing Water aerobics Patient will verbalize understanding of everyday activities that increase physical activity by providing examples like the following: Yard work, such as: Sales promotion account executive Gardening Washing windows or floors Patient will be able to explain general safety guidelines for exercising:  Before you start a new exercise program, talk with your health care provider. Do not exercise so much that you hurt  yourself, feel dizzy, or get very short of  breath. Wear comfortable clothes and wear shoes with good support. Drink plenty of water while you exercise to prevent dehydration or heat stroke. Work out until your breathing and your heartbeat get faster.        Depression Screen    09/12/2022    2:15 PM 08/25/2022    9:22 AM 07/04/2022    8:53 AM 06/27/2022    1:38 PM 03/07/2022    9:22 AM 02/20/2022   11:37 AM 07/25/2021    1:31 PM  PHQ 2/9 Scores  PHQ - 2 Score 0 0 0 0 0 0 0  PHQ- 9 Score 0 0 0 0  0     Fall Risk    09/12/2022    2:04 PM 08/25/2022    9:22 AM 07/04/2022    8:53 AM 06/27/2022    1:38 PM 03/21/2022    8:45 AM  [place] in the past year? 0 0 0 0 0  Number falls in past yr: 0      Injury with Fall? 0      Risk for fall due to : No Fall Risks      Follow up Falls prevention discussed        FALL RISK PREVENTION PERTAINING TO THE HOME:  Any stairs in or around the home? Yes  If so, are there any without handrails? No  Home free of loose throw rugs in walkways, pet beds, electrical cords, etc? Yes  Adequate lighting in your home to reduce risk of falls? Yes   ASSISTIVE DEVICES UTILIZED TO PREVENT FALLS:  Life alert? No  Use of a cane, walker or w/c? No  Grab bars in the bathroom? Yes  Shower chair or bench in shower? No  Elevated toilet seat or a handicapped toilet? No          09/12/2022    2:16 PM 07/25/2021    1:29 PM  6CIT Screen  What Year? 0 points 0 points  What month? 0 points 0 points  What time? 0 points 0 points  Count back from 20 0 points 0 points  Months in reverse 0 points 0 points  Repeat phrase 0 points 0 points  Total Score 0 points 0 points    Immunizations Immunization History  Administered Date(s) Administered   Moderna Sars-Covid-2 Vaccination 11/17/2019, 12/17/2019   Tdap 08/25/2022    TDAP status: Up to date  Flu Vaccine status: Due, Education has been provided regarding the importance of this vaccine. Advised  may receive this vaccine at local pharmacy or Health Dept. Aware to provide a copy of the vaccination record if obtained from local pharmacy or Health Dept. Verbalized acceptance and understanding.  Pneumococcal vaccine status: Due, Education has been provided regarding the importance of this vaccine. Advised may receive this vaccine at local pharmacy or Health Dept. Aware to provide a copy of the vaccination record if obtained from local pharmacy or Health Dept. Verbalized acceptance and understanding.  Covid-19 vaccine status: Completed vaccines  Qualifies for Shingles Vaccine? Yes   Zostavax completed No   Shingrix Completed?: No.    Education has been provided regarding the importance of this vaccine. Patient has been advised to call insurance company to determine out of pocket expense if they have not yet received this vaccine. Advised may also receive vaccine at local pharmacy or Health Dept. Verbalized acceptance and understanding.  Screening Tests Health Maintenance  Topic Date Due   COVID-19 Vaccine (3 - Moderna risk series)  01/14/2020   COLONOSCOPY (Pts 45-11yr Insurance coverage will need to be confirmed)  06/22/2022   INFLUENZA VACCINE  11/16/2022 (Originally 03/18/2022)   Zoster Vaccines- Shingrix (1 of 2) 11/24/2022 (Originally 03/05/1979)   Hepatitis C Screening  08/26/2023 (Originally 03/04/1978)   HIV Screening  08/26/2023 (Originally 03/05/1975)   Medicare Annual Wellness (AWV)  09/13/2023   DTaP/Tdap/Td (2 - Td or Tdap) 08/25/2032   HPV VACCINES  Aged Out    Health Maintenance  Health Maintenance Due  Topic Date Due   COVID-19 Vaccine (3 - Moderna risk series) 01/14/2020   COLONOSCOPY (Pts 45-430yrInsurance coverage will need to be confirmed)  06/22/2022    Colorectal cancer screening: Referral to GI placed 09/12/2022. Pt aware the office will call re: appt.  Lung Cancer Screening: (Low Dose CT Chest recommended if Age 63-80ears, 30 pack-year currently smoking OR  have quit w/in 15years.) does not qualify.   Lung Cancer Screening Referral: n/a  Additional Screening:  Hepatitis C Screening: does not qualify;   Vision Screening: Recommended annual ophthalmology exams for early detection of glaucoma and other disorders of the eye. Is the patient up to date with their annual eye exam?  Yes  Who is the provider or what is the name of the office in which the patient attends annual eye exams? Dr.Lee  If pt is not established with a provider, would they like to be referred to a provider to establish care? No .   Dental Screening: Recommended annual dental exams for proper oral hygiene  Community Resource Referral / Chronic Care Management: CRR required this visit?  No   CCM required this visit?  No      Plan:     I have personally reviewed and noted the following in the patient's chart:   Medical and social history Use of alcohol, tobacco or illicit drugs  Current medications and supplements including opioid prescriptions. Patient is not currently taking opioid prescriptions. Functional ability and status Nutritional status Physical activity Advanced directives List of other physicians Hospitalizations, surgeries, and ER visits in previous 12 months Vitals Screenings to include cognitive, depression, and falls Referrals and appointments  In addition, I have reviewed and discussed with patient certain preventive protocols, quality metrics, and best practice recommendations. A written personalized care plan for preventive services as well as general preventive health recommendations were provided to patient.     LaDaphane ShepherdLPN   08/22/97/7741 Nurse Notes: Due pneumonia vaccine

## 2022-09-12 NOTE — Telephone Encounter (Signed)
Pt is aware and taking medication as ordered now.

## 2022-09-15 ENCOUNTER — Encounter: Payer: Self-pay | Admitting: *Deleted

## 2022-09-16 ENCOUNTER — Telehealth: Payer: Self-pay | Admitting: Nurse Practitioner

## 2022-09-16 ENCOUNTER — Ambulatory Visit (INDEPENDENT_AMBULATORY_CARE_PROVIDER_SITE_OTHER): Payer: Medicare Other | Admitting: *Deleted

## 2022-09-16 MED ORDER — LANCETS MISC. MISC: 1.0000 | Freq: Three times a day (TID) | 0 refills | Status: AC

## 2022-09-16 MED ORDER — BLOOD GLUCOSE TEST VI STRP: 1.0000 | ORAL_STRIP | Freq: Three times a day (TID) | 0 refills | Status: AC

## 2022-09-16 MED ORDER — LANCET DEVICE MISC
1.0000 | Freq: Three times a day (TID) | 0 refills | Status: AC
Start: 2022-09-16 — End: 2022-10-16

## 2022-09-16 MED ORDER — BLOOD GLUCOSE MONITORING SUPPL DEVI: 1.0000 | Freq: Three times a day (TID) | 0 refills | Status: DC

## 2022-09-16 NOTE — Progress Notes (Signed)
Patient education, ozempic administration.

## 2022-09-16 NOTE — Telephone Encounter (Signed)
Pt called to check on status of referral to have colonoscopy. Per referral note, pt is not due to have another one until 06/2024. Reviewed note with pt and pt says that is not right. Says he was told at his last colonoscopy, 5 years ago, that because polyps were found, that he would need to have one done every 5 years so he is due now.   Please advise and call patient.

## 2022-09-16 NOTE — Telephone Encounter (Signed)
Patient called to get update on status of if Ozempic was sent in for him and if insurance approved? Says he was told that he would be called on Monday about it but no one has called him.   Please advise and call patient back ASAP.

## 2022-09-16 NOTE — Telephone Encounter (Signed)
Please check on this referral  

## 2022-09-16 NOTE — Telephone Encounter (Signed)
Disregard previous telephone message. I called [facility] and they received the Ozempic Rx. It is covered with insurance and pt is responsible for paying $94 for it. Called pt and made him aware. He voiced understanding. Says he was told by MMM that he could come to the office once he got the Rx and someone would show him how to use it. Pt is going to call office once he has the Rx to let us know when he is coming by.

## 2022-09-16 NOTE — Telephone Encounter (Signed)
Can patient be contacted and given the office information and maybe he can contact them himself

## 2022-09-18 ENCOUNTER — Ambulatory Visit: Payer: Medicare Other | Admitting: Nurse Practitioner

## 2022-10-06 ENCOUNTER — Other Ambulatory Visit: Payer: Self-pay | Admitting: Nurse Practitioner

## 2022-10-08 ENCOUNTER — Telehealth: Payer: Self-pay

## 2022-10-08 NOTE — Telephone Encounter (Signed)
Recall colonoscopy in 06/2024 is correct

## 2022-10-08 NOTE — Telephone Encounter (Signed)
Patient had referral sent over for screening colonoscopy. Recall letter in patients chart from 2018 says repeat in 5 years, however recall in chart is to repeat in 2025. Could you please let me know when patient is due for recall. Thank you.

## 2022-10-13 ENCOUNTER — Telehealth: Payer: Self-pay | Admitting: Cardiology

## 2022-10-13 ENCOUNTER — Other Ambulatory Visit (HOSPITAL_COMMUNITY): Payer: Self-pay

## 2022-10-13 NOTE — Telephone Encounter (Signed)
Pt c/o medication issue:  1. Name of Medication: amLODipine-benazepril (LOTREL) 5-40 MG capsule   2. How are you currently taking this medication (dosage and times per day)?    3. Are you having a reaction (difficulty breathing--STAT)? no  4. What is your medication issue? Pharmacy needs a prior auth for this medication. Please advise

## 2022-10-14 ENCOUNTER — Telehealth: Payer: Self-pay

## 2022-10-14 NOTE — Telephone Encounter (Signed)
Pharmacy Patient Advocate Encounter  Prior Authorization for ''amLODIPine Besy-Benazepril HCl 5-'40MG'$  capsules'' has been Approved.    KEY# [id number] Effective dates: 1.1.24 through 12.31.24

## 2022-10-14 NOTE — Telephone Encounter (Signed)
Left message for pharmacy and patient that the lotrel is approved.

## 2022-10-16 DIAGNOSIS — M79671 Pain in right foot: Secondary | ICD-10-CM | POA: Diagnosis not present

## 2022-10-16 DIAGNOSIS — M25579 Pain in unspecified ankle and joints of unspecified foot: Secondary | ICD-10-CM | POA: Diagnosis not present

## 2022-10-16 DIAGNOSIS — M7751 Other enthesopathy of right foot: Secondary | ICD-10-CM | POA: Diagnosis not present

## 2022-10-17 ENCOUNTER — Telehealth: Payer: Self-pay

## 2022-10-17 ENCOUNTER — Ambulatory Visit (INDEPENDENT_AMBULATORY_CARE_PROVIDER_SITE_OTHER): Payer: Medicare Other | Admitting: Nurse Practitioner

## 2022-10-17 ENCOUNTER — Encounter: Payer: Self-pay | Admitting: Nurse Practitioner

## 2022-10-17 VITALS — BP 139/83 | HR 69 | Temp 98.1°F | Resp 20 | Ht 68.0 in | Wt 224.0 lb

## 2022-10-17 DIAGNOSIS — Z7984 Long term (current) use of oral hypoglycemic drugs: Secondary | ICD-10-CM

## 2022-10-17 DIAGNOSIS — E119 Type 2 diabetes mellitus without complications: Secondary | ICD-10-CM | POA: Diagnosis not present

## 2022-10-17 DIAGNOSIS — R739 Hyperglycemia, unspecified: Secondary | ICD-10-CM | POA: Diagnosis not present

## 2022-10-17 LAB — BAYER DCA HB A1C WAIVED: HB A1C (BAYER DCA - WAIVED): 6.7 % — ABNORMAL HIGH (ref 4.8–5.6)

## 2022-10-17 MED ORDER — ALBUTEROL SULFATE HFA 108 (90 BASE) MCG/ACT IN AERS
2.0000 | INHALATION_SPRAY | Freq: Four times a day (QID) | RESPIRATORY_TRACT | 5 refills | Status: AC | PRN
Start: 2022-10-17 — End: ?

## 2022-10-17 MED ORDER — SEMAGLUTIDE (1 MG/DOSE) 4 MG/3ML ~~LOC~~ SOPN: 1.0000 mg | PEN_INJECTOR | SUBCUTANEOUS | 3 refills | Status: DC

## 2022-10-17 NOTE — Telephone Encounter (Signed)
Pharmacy Patient Advocate Encounter  Prior Authorization for Ozempic has been approved by [employer] (ins).    PA # [id number]  Effective dates: 10/17/2022 through 08/18/2023

## 2022-10-17 NOTE — Telephone Encounter (Signed)
Patient aware. Faxed approval letter to pharmacy

## 2022-10-17 NOTE — Progress Notes (Signed)
Subjective:    Patient ID: Donald Butler, male    DOB: 1961-02-02, 62 y.o.   MRN: [id number]   Chief Complaint: Diabetes   Diabetes Pertinent negatives for hypoglycemia include no dizziness or headaches. Pertinent negatives for diabetes include no chest pain, no polydipsia and no weakness.   Patient was seen on 08/25/22 for chronic follow up. He had not been watching diet and hgba1c was 6.9. We started him on ozempic for diabetes and weight control. Weight is down 16lbs. Blood sugars are 100-120 in mornings and 130's later in day. He has completely changed his diet  Wt Readings from Last 3 Encounters:  10/17/22 224 lb (101.6 kg)  09/12/22 240 lb (108.9 kg)  09/10/22 241 lb (109.3 kg)   BMI Readings from Last 3 Encounters:  10/17/22 34.06 kg/m  09/12/22 36.49 kg/m  09/10/22 36.64 kg/m    Lab Results  Component Value Date   HGBA1C 6.9 (H) 09/10/2022     Patient Active Problem List   Diagnosis Date Noted   Paroxysmal atrial fibrillation ([place]) 09/07/2022   Elevated coronary artery calcium score 12/10/2021   Coronary artery disease involving native coronary artery of native heart without angina pectoris 11/01/2020   Morbid obesity ([place]) 12/16/2019   Diverticulosis 05/13/2018   GERD (gastroesophageal reflux disease) 05/29/2016   HTN (hypertension) 05/29/2016   Gout 05/29/2016   Compression of common peroneal nerve, left 05/29/2016   HLD (hyperlipidemia) 05/29/2016       Review of Systems  Constitutional:  Negative for diaphoresis.  Eyes:  Negative for pain.  Respiratory:  Negative for shortness of breath.   Cardiovascular:  Negative for chest pain, palpitations and leg swelling.  Gastrointestinal:  Negative for abdominal pain.  Endocrine: Negative for polydipsia.  Skin:  Negative for rash.  Neurological:  Negative for dizziness, weakness and headaches.  Hematological:  Does not bruise/bleed easily.  All other systems reviewed and are negative.      Objective:    Physical Exam Vitals and nursing note reviewed.  Constitutional:      Appearance: Normal appearance. He is well-developed.  HENT:     Head: Normocephalic.     Nose: Nose normal.     Mouth/Throat:     Mouth: Mucous membranes are moist.     Pharynx: Oropharynx is clear.  Eyes:     Pupils: Pupils are equal, round, and reactive to light.  Neck:     Thyroid: No thyroid mass or thyromegaly.     Vascular: No carotid bruit or JVD.     Trachea: Phonation normal.  Cardiovascular:     Rate and Rhythm: Normal rate and regular rhythm.  Pulmonary:     Effort: Pulmonary effort is normal. No respiratory distress.     Breath sounds: Normal breath sounds.  Abdominal:     General: Bowel sounds are normal.     Palpations: Abdomen is soft.     Tenderness: There is no abdominal tenderness.  Musculoskeletal:        General: Normal range of motion.     Cervical back: Normal range of motion and neck supple.  Lymphadenopathy:     Cervical: No cervical adenopathy.  Skin:    General: Skin is warm and dry.  Neurological:     Mental Status: He is alert and oriented to person, place, and time.  Psychiatric:        Behavior: Behavior normal.        Thought Content: Thought content normal.  Judgment: Judgment normal.    BP 139/83   Pulse 69   Temp 98.1 F (36.7 C) (Temporal)   Resp 20   Ht '5\' 8"'$  (1.727 m)   Wt 224 lb (101.6 kg)   SpO2 96%   BMI 34.06 kg/m   HGBA1c 6.7%      Assessment & Plan:  [name] in today with chief complaint of Diabetes   1. Controlled type 2 diabetes mellitus without complication, without long-term current use of insulin (HCC) Continue to watch carbs in diet - Semaglutide, 1 MG/DOSE, 4 MG/3ML SOPN; Inject 1 mg into the skin once a week.  Dispense: 3 mL; Refill: 3    The above assessment and management plan was discussed with the patient. The patient verbalized understanding of and has agreed to the management plan. Patient is aware to call the  clinic if symptoms persist or worsen. Patient is aware when to return to the clinic for a follow-up visit. Patient educated on when it is appropriate to go to the emergency department.   [name] Done, FNP

## 2022-10-17 NOTE — Telephone Encounter (Signed)
Donald Butler (Key: [id number]) Rx #: [id number] Ozempic (1 MG/DOSE) '4MG'$ [name] pen-injectors Form [employer] Medicare Part D Electronic Prior Authorization Form (2017 NCPDP) Created 3 hours ago Sent to Plan 2 minutes ago Plan Response 2 minutes ago Submit Clinical Questions less than a minute ago Determination Wait for Determination Please wait for [employer] Medicare 2017 NCPDP to return a determination.

## 2022-10-17 NOTE — Patient Instructions (Signed)

## 2022-11-03 ENCOUNTER — Other Ambulatory Visit: Payer: Self-pay | Admitting: Nurse Practitioner

## 2022-11-03 DIAGNOSIS — K219 Gastro-esophageal reflux disease without esophagitis: Secondary | ICD-10-CM

## 2022-12-23 ENCOUNTER — Telehealth: Payer: Self-pay | Admitting: Nurse Practitioner

## 2022-12-23 ENCOUNTER — Other Ambulatory Visit: Payer: Self-pay | Admitting: Nurse Practitioner

## 2022-12-23 MED ORDER — OZEMPIC (2 MG/DOSE) 8 MG/3ML ~~LOC~~ SOPN: 2.0000 mg | PEN_INJECTOR | SUBCUTANEOUS | 2 refills | Status: DC

## 2022-12-23 NOTE — Telephone Encounter (Signed)
Patient wanted a refill and an increase on his ozempic. Sent to pharmacy and patient will keep appt in June

## 2023-01-03 ENCOUNTER — Other Ambulatory Visit: Payer: Self-pay | Admitting: Nurse Practitioner

## 2023-01-03 DIAGNOSIS — K219 Gastro-esophageal reflux disease without esophagitis: Secondary | ICD-10-CM

## 2023-01-30 ENCOUNTER — Encounter: Payer: Self-pay | Admitting: Nurse Practitioner

## 2023-01-30 ENCOUNTER — Ambulatory Visit (INDEPENDENT_AMBULATORY_CARE_PROVIDER_SITE_OTHER): Payer: Medicare Other | Admitting: Nurse Practitioner

## 2023-01-30 VITALS — BP 127/76 | HR 79 | Temp 97.8°F | Resp 20 | Ht 68.0 in

## 2023-01-30 DIAGNOSIS — M1A0710 Idiopathic chronic gout, right ankle and foot, without tophus (tophi): Secondary | ICD-10-CM | POA: Diagnosis not present

## 2023-01-30 DIAGNOSIS — I10 Essential (primary) hypertension: Secondary | ICD-10-CM

## 2023-01-30 DIAGNOSIS — K219 Gastro-esophageal reflux disease without esophagitis: Secondary | ICD-10-CM

## 2023-01-30 DIAGNOSIS — E785 Hyperlipidemia, unspecified: Secondary | ICD-10-CM | POA: Diagnosis not present

## 2023-01-30 DIAGNOSIS — E119 Type 2 diabetes mellitus without complications: Secondary | ICD-10-CM

## 2023-01-30 DIAGNOSIS — I2510 Atherosclerotic heart disease of native coronary artery without angina pectoris: Secondary | ICD-10-CM

## 2023-01-30 DIAGNOSIS — M109 Gout, unspecified: Secondary | ICD-10-CM

## 2023-01-30 DIAGNOSIS — E782 Mixed hyperlipidemia: Secondary | ICD-10-CM

## 2023-01-30 DIAGNOSIS — K5790 Diverticulosis of intestine, part unspecified, without perforation or abscess without bleeding: Secondary | ICD-10-CM | POA: Diagnosis not present

## 2023-01-30 DIAGNOSIS — E1169 Type 2 diabetes mellitus with other specified complication: Secondary | ICD-10-CM | POA: Diagnosis not present

## 2023-01-30 DIAGNOSIS — I480 Paroxysmal atrial fibrillation: Secondary | ICD-10-CM

## 2023-01-30 LAB — CBC WITH DIFFERENTIAL/PLATELET
Basophils Absolute: 0 10*3/uL (ref 0.0–0.2)
Basos: 1 %
EOS (ABSOLUTE): 0.2 10*3/uL (ref 0.0–0.4)
Eos: 4 %
Hematocrit: 42.4 % (ref 37.5–51.0)
Hemoglobin: 14.7 g/dL (ref 13.0–17.7)
Immature Grans (Abs): 0 10*3/uL (ref 0.0–0.1)
Immature Granulocytes: 0 %
Lymphocytes Absolute: 1.5 10*3/uL (ref 0.7–3.1)
Lymphs: 21 %
MCH: 32.2 pg (ref 26.6–33.0)
MCHC: 34.7 g/dL (ref 31.5–35.7)
MCV: 93 fL (ref 79–97)
Monocytes Absolute: 0.7 10*3/uL (ref 0.1–0.9)
Monocytes: 10 %
Neutrophils Absolute: 4.5 10*3/uL (ref 1.4–7.0)
Neutrophils: 64 %
Platelets: 281 10*3/uL (ref 150–450)
RBC: 4.56 x10E6/uL (ref 4.14–5.80)
RDW: 12.5 % (ref 11.6–15.4)
WBC: 6.9 10*3/uL (ref 3.4–10.8)

## 2023-01-30 LAB — CMP14+EGFR
ALT: 39 IU/L (ref 0–44)
AST: 35 IU/L (ref 0–40)
Albumin/Globulin Ratio: 1.8
Albumin: 4.4 g/dL (ref 3.9–4.9)
Alkaline Phosphatase: 96 IU/L (ref 44–121)
BUN/Creatinine Ratio: 25 — ABNORMAL HIGH (ref 10–24)
BUN: 19 mg/dL (ref 8–27)
Bilirubin Total: 0.6 mg/dL (ref 0.0–1.2)
CO2: 22 mmol/L (ref 20–29)
Calcium: 9.6 mg/dL (ref 8.6–10.2)
Chloride: 102 mmol/L (ref 96–106)
Creatinine, Ser: 0.77 mg/dL (ref 0.76–1.27)
Globulin, Total: 2.4 g/dL (ref 1.5–4.5)
Glucose: 106 mg/dL — ABNORMAL HIGH (ref 70–99)
Potassium: 4.6 mmol/L (ref 3.5–5.2)
Sodium: 139 mmol/L (ref 134–144)
Total Protein: 6.8 g/dL (ref 6.0–8.5)
eGFR: 101 mL/min/{1.73_m2} (ref >59)

## 2023-01-30 LAB — LIPID PANEL
Chol/HDL Ratio: 2.2 ratio (ref 0.0–5.0)
Cholesterol, Total: 153 mg/dL (ref 100–199)
HDL: 69 mg/dL (ref >39)
LDL Chol Calc (NIH): 70 mg/dL (ref 0–99)
Triglycerides: 75 mg/dL (ref 0–149)
VLDL Cholesterol Cal: 14 mg/dL (ref 5–40)

## 2023-01-30 LAB — BAYER DCA HB A1C WAIVED: HB A1C (BAYER DCA - WAIVED): 5.1 % (ref 4.8–5.6)

## 2023-01-30 MED ORDER — ATORVASTATIN CALCIUM 40 MG PO TABS: 40.0000 mg | ORAL_TABLET | Freq: Every day | ORAL | 3 refills | Status: DC

## 2023-01-30 MED ORDER — AMLODIPINE BESY-BENAZEPRIL HCL 5-40 MG PO CAPS: 2.0000 | ORAL_CAPSULE | Freq: Every day | ORAL | 3 refills | Status: DC

## 2023-01-30 MED ORDER — OMEPRAZOLE 20 MG PO CPDR: DELAYED_RELEASE_CAPSULE | ORAL | 1 refills | Status: DC

## 2023-01-30 MED ORDER — ALLOPURINOL 300 MG PO TABS: 300.0000 mg | ORAL_TABLET | Freq: Every day | ORAL | 1 refills | Status: DC

## 2023-01-30 MED ORDER — OZEMPIC (2 MG/DOSE) 8 MG/3ML ~~LOC~~ SOPN: 2.0000 mg | PEN_INJECTOR | SUBCUTANEOUS | 2 refills | Status: DC

## 2023-01-30 NOTE — Progress Notes (Signed)
Subjective:    Patient ID: Donald Butler, male    DOB: 1961/01/14, 62 y.o.   MRN: [id number]   Chief Complaint: medical management of chronic issues     HPI:  Donald Butler is a 62 y.o. who identifies as a male who was assigned male at birth.   Social history: Lives with: wife Work history: retired   Water engineer in today for follow up of the following chronic medical issues:  1. Primary hypertension No c/o chest pain, sob or headache. Does not check blood pressure at home. BP Readings from Last 3 Encounters:  10/17/22 139/83  09/10/22 (!) 140/90  08/25/22 138/79     2. Coronary artery disease involving native coronary artery of native heart without angina pectoris 3. Paroxysmal atrial fibrillation [facility]) Last saw cardiology on 09/10/22. According to office note his blood pressure meds were increased at visit.   4. Hyperlipidemia associated with type 2 diabetes mellitus (HCC) Does try to watch diet. No dedicated exercise. Lab Results  Component Value Date   HGBA1C 6.7 (H) 10/17/2022      5. Diet-controlled diabetes mellitus (HCC) When saw cardiologist an HGBA1c was done. Hoping he could get diabetes dx so he could take ozempic to lose some weight. He is currently on ozempic weekly. Lab Results  Component Value Date   HGBA1C 6.7 (H) 10/17/2022     6. Gastroesophageal reflux disease, unspecified whether esophagitis present Is on omeprazole an dis doing well  7. Diverticulosis No recent flare ups  8. Gout, unspecified cause, unspecified chronicity, unspecified site Denies any recent flare ups  9. Morbid obesity (HCC) Weight is down 16lbs Wt Readings from Last 3 Encounters:  10/17/22 224 lb (101.6 kg)  09/12/22 240 lb (108.9 kg)  09/10/22 241 lb (109.3 kg)   BMI Readings from Last 3 Encounters:  01/30/23 34.06 kg/m  10/17/22 34.06 kg/m  09/12/22 36.49 kg/m     New complaints: None today  Allergies  Allergen Reactions   Penicillins Hives, Shortness  Of Breath and Swelling   Outpatient Encounter Medications as of 01/30/2023  Medication Sig   albuterol (VENTOLIN HFA) 108 (90 Base) MCG/ACT inhaler Inhale 2 puffs into the lungs every 6 (six) hours as needed for wheezing or shortness of breath.   allopurinol (ZYLOPRIM) 300 MG tablet Take 1 tablet (300 mg total) by mouth daily.   amLODipine-benazepril (LOTREL) 5-40 MG capsule Take 2 capsules by mouth daily.   aspirin EC 81 MG tablet Take 81 mg by mouth daily.    atorvastatin (LIPITOR) 40 MG tablet Take 1 tablet (40 mg total) by mouth daily.   Blood Glucose Monitoring Suppl DEVI 1 each by Does not apply route in the morning, at noon, and at bedtime. May substitute to any manufacturer covered by patient's insurance.   colchicine 0.6 MG tablet TAKE 1 TABLET BY MOUTH ONCE DAILY AS NEEDED   HYDROcodone-acetaminophen (NORCO) 10-325 MG tablet Take 1 tablet by mouth every 8 (eight) hours as needed.   loratadine (CLARITIN) 10 MG tablet Take 10 mg by mouth daily.    Multiple Vitamin (MULTIVITAMIN) tablet Take 1 tablet by mouth daily.    omeprazole (PRILOSEC) 20 MG capsule TAKE 1 CAPSULE BY MOUTH TWICE DAILY ON AN EMPTY STOMACH, THEN WAIT ONE HOUR TO EAT   Semaglutide, 2 MG/DOSE, (OZEMPIC, 2 MG/DOSE,) 8 MG/3ML SOPN Inject 2 mg into the skin once a week.   No facility-administered encounter medications on file as of 01/30/2023.    Past Surgical History:  Procedure Laterality Date   BACK SURGERY  1982, 1983   ruptured disk  x 2   CARPAL TUNNEL RELEASE Left    COLONOSCOPY     EXTERNAL EAR SURGERY     KNEE ARTHROSCOPY  2006, 2008   x 2  left   MASTOIDECTOMY  2013   right   OPEN ANTERIOR SHOULDER RECONSTRUCTION  1970   right   POLYPECTOMY     reconstruction left leg  2009   multiple surgery after accident    Family History  Problem Relation Age of Onset   Diabetes Father    Colon cancer Neg Hx    Stomach cancer Neg Hx    Esophageal cancer Neg Hx    Rectal cancer Neg Hx    Pancreatic cancer  Neg Hx       Controlled substance contract: n/a     Review of Systems  Constitutional:  Negative for diaphoresis.  Eyes:  Negative for pain.  Respiratory:  Negative for shortness of breath.   Cardiovascular:  Negative for chest pain, palpitations and leg swelling.  Gastrointestinal:  Negative for abdominal pain.  Endocrine: Negative for polydipsia.  Skin:  Negative for rash.  Neurological:  Negative for dizziness, weakness and headaches.  Hematological:  Does not bruise/bleed easily.  All other systems reviewed and are negative.      Objective:   Physical Exam Vitals and nursing note reviewed.  Constitutional:      Appearance: Normal appearance. He is well-developed.  HENT:     Head: Normocephalic.     Nose: Nose normal.     Mouth/Throat:     Mouth: Mucous membranes are moist.     Pharynx: Oropharynx is clear.  Eyes:     Pupils: Pupils are equal, round, and reactive to light.  Neck:     Thyroid: No thyroid mass or thyromegaly.     Vascular: No carotid bruit or JVD.     Trachea: Phonation normal.  Cardiovascular:     Rate and Rhythm: Normal rate and regular rhythm.  Pulmonary:     Effort: Pulmonary effort is normal. No respiratory distress.     Breath sounds: Normal breath sounds.  Abdominal:     General: Bowel sounds are normal.     Palpations: Abdomen is soft.     Tenderness: There is no abdominal tenderness.  Musculoskeletal:        General: Normal range of motion.     Cervical back: Normal range of motion and neck supple.  Lymphadenopathy:     Cervical: No cervical adenopathy.  Skin:    General: Skin is warm and dry.  Neurological:     Mental Status: He is alert and oriented to person, place, and time.  Psychiatric:        Behavior: Behavior normal.        Thought Content: Thought content normal.        Judgment: Judgment normal.     BP 127/76   Pulse 79   Temp 97.8 F (36.6 C) (Temporal)   Resp 20   Ht 5\' 8"  (1.727 m)   SpO2 97%   BMI 34.06  kg/m  Hgba1c 5.1%    Assessment & Plan:   Donald Butler comes in today with chief complaint of Medical Management of Chronic Issues   Diagnosis and orders addressed:  1. Primary hypertension Low sodium diet - CBC with Differential/Platelet - CMP14+EGFR - amLODipine-benazepril (LOTREL) 5-40 MG capsule; Take 2 capsules by mouth daily.  Dispense: 180 capsule; Refill: 3  2. Coronary artery disease involving native coronary artery of native heart without angina pectoris 3. Paroxysmal atrial fibrillation (HCC) Keep follow up with cardiologist  4. Hyperlipidemia associated with type 2 diabetes mellitus (HCC) Low fat diet - Lipid panel - atorvastatin (LIPITOR) 40 MG tablet; Take 1 tablet (40 mg total) by mouth daily.  Dispense: 90 tablet; Refill: 3  5. Diet-controlled diabetes mellitus (HCC) Continue to watch carbs in diet - Bayer DCA Hb A1c Waived - Microalbumin / creatinine urine ratio - Semaglutide, 2 MG/DOSE, (OZEMPIC, 2 MG/DOSE,) 8 MG/3ML SOPN; Inject 2 mg into the skin once a week.  Dispense: 3 mL; Refill: 2  6. Gastroesophageal reflux disease, unspecified whether esophagitis present Avoid spicy foods Do not eat 2 hours prior to bedtime  - omeprazole (PRILOSEC) 20 MG capsule; TAKE 1 CAPSULE BY MOUTH TWICE DAILY ON AN EMPTY STOMACH, THEN WAIT ONE HOUR TO EAT  Dispense: 180 capsule; Refill: 1  7. Diverticulosis Watch diet to prevent flare up  8. Gout, unspecified cause, unspecified chronicity, unspecified site Watch diet to prevent flare up - allopurinol (ZYLOPRIM) 300 MG tablet; Take 1 tablet (300 mg total) by mouth daily.  Dispense: 90 tablet; Refill: 1  9. Morbid obesity (HCC) Discussed diet and exercise for person with BMI >25 Will recheck weight in 3-6 months     Labs pending Health Maintenance reviewed Diet and exercise encouraged  Follow up plan: 6 months   [name] Daphine Deutscher, FNP

## 2023-01-30 NOTE — Patient Instructions (Signed)

## 2023-02-23 ENCOUNTER — Ambulatory Visit: Payer: Medicare Other | Admitting: Nurse Practitioner

## 2023-03-04 LAB — HM DIABETES EYE EXAM

## 2023-03-23 ENCOUNTER — Ambulatory Visit (INDEPENDENT_AMBULATORY_CARE_PROVIDER_SITE_OTHER): Payer: Medicare Other | Admitting: Nurse Practitioner

## 2023-03-23 ENCOUNTER — Encounter: Payer: Self-pay | Admitting: Nurse Practitioner

## 2023-03-23 VITALS — BP 126/72 | HR 72 | Temp 98.2°F | Resp 20 | Ht 68.0 in | Wt 224.0 lb

## 2023-03-23 DIAGNOSIS — H6191 Disorder of right external ear, unspecified: Secondary | ICD-10-CM

## 2023-03-23 DIAGNOSIS — H6192 Disorder of left external ear, unspecified: Secondary | ICD-10-CM

## 2023-03-23 NOTE — Progress Notes (Signed)
   Subjective:    Patient ID: Donald Butler, male    DOB: 25-Apr-1961, 62 y.o.   MRN: [id number]   Chief Complaint: cryotherapy  HPI Patient has keratotic lesions on auricle of ears that he wants frozen off.   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  Eyes:  Negative for pain.  Respiratory:  Negative for shortness of breath.   Cardiovascular:  Negative for chest pain, palpitations and leg swelling.  Gastrointestinal:  Negative for abdominal pain.  Endocrine: Negative for polydipsia.  Skin:  Negative for rash.  Neurological:  Negative for dizziness, weakness and headaches.  Hematological:  Does not bruise/bleed easily.  All other systems reviewed and are negative.      Objective:   Physical Exam Constitutional:      Appearance: Normal appearance.  Cardiovascular:     Rate and Rhythm: Normal rate and regular rhythm.  Skin:    General: Skin is warm.     Comments: 1cm keratotic lesion on auricle of both ears.  Neurological:     Mental Status: He is alert.    BP 126/72   Pulse 72   Temp 98.2 F (36.8 C) (Temporal)   Resp 20   Ht 5\' 8"  (1.727 m)   Wt 224 lb (101.6 kg)   SpO2 99%   BMI 34.06 kg/m   Crytherapy of 2 lesion bil auricle- 4 lesions total       Assessment & Plan:   Donald Butler in today with chief complaint of Skin  tag removal   1. Skin lesion of left ear 2. Skin lesion of right ear Do not pick at lesions May need another treatment    The above assessment and management plan was discussed with the patient. The patient verbalized understanding of and has agreed to the management plan. Patient is aware to call the clinic if symptoms persist or worsen. Patient is aware when to return to the clinic for a follow-up visit. Patient educated on when it is appropriate to go to the emergency department.   [name] Daphine Deutscher, FNP

## 2023-03-23 NOTE — Patient Instructions (Signed)
Cryoablation Cryoablation is a procedure to get rid of abnormal growths or cancerous tissue. This is done by freezing the growth or tissue with liquid nitrogen or argon gas. This procedure is also known as cryotherapy or cryosurgery. It may be done to treat: Skin tumors. These are abnormal growths of cells on the skin. Knots of tissue called nodules that are benign. This means that they are not cancerous. Retinoblastoma. This is a type of eye cancer. Cancers of the prostate, liver, kidney, cervix, lung, and bone. Tell a health care provider about: Any allergies you have. All medicines you are taking, including vitamins, herbs, eye drops, creams, and over-the-counter medicines. Any problems you or family members have had with anesthesia. Any bleeding problems you have. Any surgeries you have had. Any medical conditions you have. Whether you are pregnant or may be pregnant. What are the risks? Your health care provider will talk with you about risks. These may include: Infection. Bleeding. Swelling. Allergic reactions to medicines. Damage to nearby structures or organs. In rare cases, there may be damage to nerves. This can cause numbness. What happens before the procedure? When to stop eating and drinking Follow instructions from your health care provider about what you may eat and drink. These may include: 8 hours before your procedure Stop eating most foods. Do not eat meat, fried foods, or fatty foods. Eat only light foods, such as toast or crackers. All liquids are okay except energy drinks and alcohol. 6 hours before your procedure Stop eating. Drink only clear liquids, such as water, clear fruit juice, black coffee, plain tea, and sports drinks. Do not drink energy drinks or alcohol. 2 hours before your procedure Stop drinking all liquids. You may be allowed to take medicines with small sips of water. If you do not follow your health care provider's instructions, your  procedure may be delayed or canceled. Medicines Ask your health care provider about: Changing or stopping your regular medicines. These include any diabetes medicines or blood thinners you take. Taking medicines such as aspirin and ibuprofen. These medicines can thin your blood. Do not take them unless your health care provider tells you to. Taking over-the-counter medicines, vitamins, herbs, and supplements. Tests A medical history will be taken. You may also have an exam and testing. Tests may include: Blood tests. Imaging tests. Surgery safety Ask your health care provider: How your surgery site will be marked. What steps will be taken to help prevent infection. These may include: Removing hair at the surgery site. Washing skin with a soap that kills germs. Receiving antibiotics. General instructions Do not use any products that contain nicotine or tobacco for at least 4 weeks before the procedure. These products include cigarettes, chewing tobacco, and vaping devices, such as e-cigarettes. If you need help quitting, ask your health care provider. If you will be going home right after the procedure, plan to have a responsible adult: Take you home from the hospital or clinic. You will not be allowed to drive. Care for you for the time you are told. What happens during the procedure? An IV will be inserted into one of your veins. You may be given: A sedative. This helps you relax. Anesthesia. This will: Numb certain areas of your body. Make you fall asleep for surgery. A device called a cryoprobe will be used to freeze the growth. Liquid nitrogen or argon gas will flow through the device. How the device is put on the growth will depend on where the growth is  in your body. The cryoprobe may be: Applied directly to the area. This is often done for skin cancers and nodules. Inserted through an incision into the area, such as the prostate. Passed through a thin, long tube called an  endoscope. The scope will allow the device to reach deeper spots in your body, such as the lungs or liver. The cryoprobe may be guided using imaging. This may include an ultrasound, CT scan, or MRI. Liquid nitrogen or argon gas will be sent to the growth until it is frozen and destroyed. If other areas need treatment, the process may be repeated on those areas. The cryoprobe will be removed. Pressure will be applied to stop any bleeding. If an incision was made, it will be closed with stitches (sutures) and covered with a bandage (dressing). The procedure may vary among health care providers and hospitals. What happens after the procedure?  Your blood pressure, heart rate, breathing rate, and blood oxygen level will be monitored until you leave the hospital or clinic. You will be given medicine to help with pain, nausea, and vomiting as needed. If you were given a sedative during the procedure, it can affect you for several hours. Do not drive or operate machinery until your health care provider says that it is safe. This information is not intended to replace advice given to you by your health care provider. Make sure you discuss any questions you have with your health care provider. Document Revised: 01/17/2022 Document Reviewed: 01/17/2022 Elsevier Patient Education  2024 [employer].

## 2023-06-02 ENCOUNTER — Other Ambulatory Visit: Payer: Self-pay

## 2023-06-02 DIAGNOSIS — E119 Type 2 diabetes mellitus without complications: Secondary | ICD-10-CM

## 2023-06-08 ENCOUNTER — Ambulatory Visit (INDEPENDENT_AMBULATORY_CARE_PROVIDER_SITE_OTHER): Payer: Medicare Other | Admitting: Nurse Practitioner

## 2023-06-08 ENCOUNTER — Other Ambulatory Visit: Payer: Medicare Other

## 2023-06-08 ENCOUNTER — Encounter: Payer: Self-pay | Admitting: Nurse Practitioner

## 2023-06-08 VITALS — BP 135/83 | HR 81 | Temp 98.3°F | Resp 20 | Ht 68.0 in

## 2023-06-08 DIAGNOSIS — R197 Diarrhea, unspecified: Secondary | ICD-10-CM | POA: Diagnosis not present

## 2023-06-08 DIAGNOSIS — H6503 Acute serous otitis media, bilateral: Secondary | ICD-10-CM | POA: Diagnosis not present

## 2023-06-08 MED ORDER — ACCU-CHEK GUIDE ME W/DEVICE KIT: PACK | 0 refills | Status: AC

## 2023-06-08 MED ORDER — CIPROFLOXACIN-DEXAMETHASONE 0.3-0.1 % OT SUSP: 4.0000 [drp] | Freq: Two times a day (BID) | OTIC | 0 refills | Status: DC

## 2023-06-08 MED ORDER — BLOOD GLUCOSE MONITORING SUPPL DEVI: 1.0000 | Freq: Three times a day (TID) | 0 refills | Status: AC

## 2023-06-08 MED ORDER — ACCU-CHEK SOFTCLIX LANCETS MISC: 3 refills | Status: AC

## 2023-06-08 MED ORDER — ACCU-CHEK GUIDE VI STRP: ORAL_STRIP | 3 refills | Status: AC

## 2023-06-08 NOTE — Patient Instructions (Signed)
C. Diff Infection C. diff infection, or C. diff, is caused by germs (bacteria) called Clostridioides difficile. These germs cause diarrhea and very bad inflammation in your colon. This infection often happens after taking antibiotics. C. diff can spread easily to others, usually from touching or swallowing something that has the germ on it. What are the causes? Common causes of C. diff include: Taking antibiotics. Coming in contact with people, food, or things that have the germ on it. What increases the risk? You may be more likely to get C. diff if you: Take antibiotics that kill many types of germs or take antibiotics for a long time. Stay in a hospital or nursing home for a long time. Are age 87 or older. Have had it before or have been in contact with C. diff germs. Have a weak disease-fighting, or immune, system. Have a serious health condition, such as colon cancer or inflammatory bowel disease (IBD). Take medicines that treat stomach acid. Have had a surgery on your digestive system. What are the signs or symptoms? Diarrhea at least 3 times a day for many days. Fever. Nausea. Not feeling hungry. Swelling, pain, cramping, or tenderness in the belly, or abdomen. How is this diagnosed? C. diff is diagnosed with: Your medical history and a physical exam. Tests such as: A test that checks your poop (stool) for C. diff. Blood tests. Imaging tests of your intestines, such as a CT scan or X-ray. A procedure to look at your colon. This is rare. How is this treated? Treatment for C. diff may include: Stopping the antibiotics that caused C. diff. Taking antibiotics that kill C. diff. Placing poop from a healthy person into your colon. This is called a fecal transplant. It may be done if C. diff keeps coming back. Having surgery to take out the infected part of the colon. This is rare. Follow these instructions at home: Medicines Take over-the-counter and prescription medicines only  as told by your health care provider. Take your antibiotics as told by your provider. Do not stop taking your antibiotics even if you start to feel better. Do not take medicines that treat diarrhea unless your provider tells you to. Eating and drinking Follow instructions from your provider about what you may eat or drink. Eat bland foods in small amounts that are easy to digest. These include bananas, applesauce, rice, and toast. Avoid milk, caffeine, and alcohol. Replace any body fluid that has been lost. You can do this by: Drinking clear fluids to keep your pee (urine) pale yellow. This includes water, clear fruit juice with water added to it, and low-calorie sports drinks. Sucking on ice chips. Taking an oral rehydration solution (ORS). This drink is sold at pharmacies and retail stores. General instructions Wash your hands often with soap and water for at least 20 seconds. Take a bath or shower every day. Return to your normal activities as told by your provider. Ask your provider what activities are safe for you. Be sure your home is clean before you leave the hospital or clinic. Clean every day for at least a week. Keep all follow-up visits to make sure the infection is gone. How is this prevented? Wash [employer] your hands with soap and water for at least 20 seconds. Wash your hands before cooking and after using the bathroom. Other people should wash their hands too, especially: People who live with you. People who visit you. Stop germs from spreading Tell your provider right away if you get  diarrhea while in a hospital or nursing home. When you visit someone in a hospital or nursing home, wear a gown, gloves, or other protection. Try to stay away from people who have diarrhea. Try to use a different bathroom if you're sick and live with other people. Clean surfaces Clean surfaces that you touch every day. Use a product that has a 10% chlorine bleach solution. Be sure  to: Read the product label to make sure it will kill germs. Clean toilets and flush handles, bathtubs, sinks, doorknobs and handles, countertops, and work surfaces. If you're in the hospital, make sure the surfaces in your room are cleaned each day. Tell someone right away if body fluids have splashed or spilled. Washing clothes and linens Wash clothes and linens using laundry soap that has chlorine bleach. Be sure to: Use powder soap instead of liquid. Clean your washing machine once a month. To do this, turn on the hot setting with only soap in it. Contact a health care provider if: Your symptoms do not get better or get worse. Your symptoms go away and then come back. You have a fever. You have new symptoms. Get help right away if: You have more pain or tenderness in your belly. You have poop that's bloody or looks black and tarry. You vomit every time you eat or drink. You have signs of not having enough fluids in your body. These include: Dark yellow pee, very little pee, or no pee. Cracked lips or dry mouth. No tears when you cry. Sunken eyes. Feeling tired. Feeling weak or dizzy. This information is not intended to replace advice given to you by your health care provider. Make sure you discuss any questions you have with your health care provider. Document Revised: 10/27/2022 Document Reviewed: 10/27/2022 Elsevier Patient Education  2024 [employer].

## 2023-06-08 NOTE — Progress Notes (Signed)
Fax from [facility] needing separate Rx for strips and lancets

## 2023-06-08 NOTE — Addendum Note (Signed)
Addended by: Julious Payer D on: 06/08/2023 12:57 PM   Modules accepted: Orders

## 2023-06-08 NOTE — Addendum Note (Signed)
Addended by: Julious Payer D on: 06/08/2023 12:38 PM   Modules accepted: Orders

## 2023-06-08 NOTE — Progress Notes (Signed)
Subjective:    Patient ID: Donald Butler, male    DOB: 1961/05/07, 62 y.o.   MRN: [id number]   Chief Complaint: Ear Pain (Left ear bleeding over the weekend/), Cough, and Foul smelling diarrhea   Cough Associated symptoms include postnasal drip. Pertinent negatives include no chills or fever.    Patient comes in today with 2 complaints: - left ear pain- was bleeding over the weekend. - foul smelling diarrhea- went on a cruise and diarrhea started after that. He was sick on cruise. The diarrhea is better but his stool smeels awful.   Patient Active Problem List   Diagnosis Date Noted   Diet-controlled diabetes mellitus (HCC) 10/17/2022   Paroxysmal atrial fibrillation (HCC) 09/07/2022   Elevated coronary artery calcium score 12/10/2021   Coronary artery disease involving native coronary artery of native heart without angina pectoris 11/01/2020   Morbid obesity (HCC) 12/16/2019   Diverticulosis 05/13/2018   GERD (gastroesophageal reflux disease) 05/29/2016   HTN (hypertension) 05/29/2016   Gout 05/29/2016   Compression of common peroneal nerve, left 05/29/2016   Hyperlipidemia associated with type 2 diabetes mellitus (HCC) 05/29/2016       Review of Systems  Constitutional:  Negative for chills and fever.  HENT:  Positive for congestion, ear discharge, postnasal drip and sinus pressure.   Respiratory:  Positive for cough.   Gastrointestinal:  Positive for diarrhea.       Objective:   Physical Exam Vitals and nursing note reviewed.  Constitutional:      Appearance: Normal appearance. He is well-developed.  HENT:     Head: Normocephalic.     Right Ear: A middle ear effusion is present. A PE tube is present.     Left Ear: A middle ear effusion is present. A PE tube is present. Tympanic membrane is erythematous.     Nose: Nose normal.     Mouth/Throat:     Mouth: Mucous membranes are moist.     Pharynx: Oropharynx is clear.  Eyes:     Pupils: Pupils are equal,  round, and reactive to light.  Neck:     Thyroid: No thyroid mass or thyromegaly.     Vascular: No carotid bruit or JVD.     Trachea: Phonation normal.  Cardiovascular:     Rate and Rhythm: Normal rate and regular rhythm.  Pulmonary:     Effort: Pulmonary effort is normal. No respiratory distress.     Breath sounds: Normal breath sounds.  Abdominal:     General: Bowel sounds are normal.     Palpations: Abdomen is soft.     Tenderness: There is no abdominal tenderness.  Musculoskeletal:        General: Normal range of motion.     Cervical back: Normal range of motion and neck supple.  Lymphadenopathy:     Cervical: No cervical adenopathy.  Skin:    General: Skin is warm and dry.  Neurological:     General: No focal deficit present.     Mental Status: He is alert and oriented to person, place, and time.  Psychiatric:        Behavior: Behavior normal.        Thought Content: Thought content normal.        Judgment: Judgment normal.    BP 135/83   Pulse 81   Temp 98.3 F (36.8 C) (Temporal)   Resp 20   Ht 5\' 8"  (1.727 m)   SpO2 97%   BMI 34.06 kg/m  Assessment & Plan:  DASHAWN BICKNELL in today with chief complaint of Ear Pain (Left ear bleeding over the weekend/), Cough, and Foul smelling diarrhea   1. Non-recurrent acute serous otitis media of both ears OTC decongestant - ciprofloxacin-dexamethasone (CIPRODEX) OTIC suspension; Place 4 drops into both ears 2 (two) times daily.  Dispense: 7.5 mL; Refill: 0  2. Diarrhea, unspecified type Do stool specimen as soon as possible - Cdiff NAA+O+P+Stool Culture    The above assessment and management plan was discussed with the patient. The patient verbalized understanding of and has agreed to the management plan. Patient is aware to call the clinic if symptoms persist or worsen. Patient is aware when to return to the clinic for a follow-up visit. Patient educated on when it is appropriate to go to the emergency  department.   [name] Daphine Deutscher, FNP

## 2023-06-09 ENCOUNTER — Telehealth: Payer: Self-pay | Admitting: Nurse Practitioner

## 2023-06-09 MED ORDER — DOXYCYCLINE HYCLATE 100 MG PO TABS
100.0000 mg | ORAL_TABLET | Freq: Two times a day (BID) | ORAL | 0 refills | Status: DC
Start: 2023-06-09 — End: 2023-08-31

## 2023-06-09 NOTE — Addendum Note (Signed)
Addended by: Bennie Pierini on: 06/09/2023 04:13 PM   Modules accepted: Orders

## 2023-06-09 NOTE — Telephone Encounter (Signed)
Please review

## 2023-06-12 LAB — CDIFF NAA+O+P+STOOL CULTURE
E coli, Shiga toxin Assay: NEGATIVE
Toxigenic C. Difficile by PCR: NEGATIVE

## 2023-07-03 LAB — STATE LABORATORY REPORT

## 2023-07-22 ENCOUNTER — Other Ambulatory Visit: Payer: Self-pay | Admitting: Nurse Practitioner

## 2023-07-22 DIAGNOSIS — E119 Type 2 diabetes mellitus without complications: Secondary | ICD-10-CM

## 2023-07-31 ENCOUNTER — Ambulatory Visit (INDEPENDENT_AMBULATORY_CARE_PROVIDER_SITE_OTHER): Payer: Medicare Other | Admitting: Nurse Practitioner

## 2023-07-31 ENCOUNTER — Encounter: Payer: Self-pay | Admitting: Nurse Practitioner

## 2023-07-31 VITALS — BP 121/79 | HR 75 | Temp 97.7°F | Ht 68.0 in | Wt 198.8 lb

## 2023-07-31 DIAGNOSIS — I480 Paroxysmal atrial fibrillation: Secondary | ICD-10-CM | POA: Diagnosis not present

## 2023-07-31 DIAGNOSIS — M109 Gout, unspecified: Secondary | ICD-10-CM | POA: Diagnosis not present

## 2023-07-31 DIAGNOSIS — E1169 Type 2 diabetes mellitus with other specified complication: Secondary | ICD-10-CM

## 2023-07-31 DIAGNOSIS — E119 Type 2 diabetes mellitus without complications: Secondary | ICD-10-CM

## 2023-07-31 DIAGNOSIS — Z7985 Long-term (current) use of injectable non-insulin antidiabetic drugs: Secondary | ICD-10-CM | POA: Diagnosis not present

## 2023-07-31 DIAGNOSIS — I10 Essential (primary) hypertension: Secondary | ICD-10-CM

## 2023-07-31 DIAGNOSIS — E785 Hyperlipidemia, unspecified: Secondary | ICD-10-CM

## 2023-07-31 DIAGNOSIS — K5790 Diverticulosis of intestine, part unspecified, without perforation or abscess without bleeding: Secondary | ICD-10-CM

## 2023-07-31 DIAGNOSIS — K219 Gastro-esophageal reflux disease without esophagitis: Secondary | ICD-10-CM

## 2023-07-31 DIAGNOSIS — I2510 Atherosclerotic heart disease of native coronary artery without angina pectoris: Secondary | ICD-10-CM | POA: Diagnosis not present

## 2023-07-31 LAB — LIPID PANEL

## 2023-07-31 LAB — BAYER DCA HB A1C WAIVED: HB A1C (BAYER DCA - WAIVED): 5 % (ref 4.8–5.6)

## 2023-07-31 MED ORDER — OZEMPIC (2 MG/DOSE) 8 MG/3ML ~~LOC~~ SOPN: 2.0000 mg | PEN_INJECTOR | SUBCUTANEOUS | 1 refills | Status: DC

## 2023-07-31 MED ORDER — AMLODIPINE BESY-BENAZEPRIL HCL 5-40 MG PO CAPS: 2.0000 | ORAL_CAPSULE | Freq: Every day | ORAL | 1 refills | Status: DC

## 2023-07-31 MED ORDER — ALLOPURINOL 300 MG PO TABS: 300.0000 mg | ORAL_TABLET | Freq: Every day | ORAL | 1 refills | Status: DC

## 2023-07-31 MED ORDER — ATORVASTATIN CALCIUM 40 MG PO TABS: 40.0000 mg | ORAL_TABLET | Freq: Every day | ORAL | 3 refills | Status: DC

## 2023-07-31 MED ORDER — OMEPRAZOLE 20 MG PO CPDR: DELAYED_RELEASE_CAPSULE | ORAL | 1 refills | Status: DC

## 2023-07-31 NOTE — Patient Instructions (Signed)
Exercising to Stay Healthy To become healthy and stay healthy, it is recommended that you do moderate-intensity and vigorous-intensity exercise. You can tell that you are exercising at a moderate intensity if your heart starts beating faster and you start breathing faster but can still hold a conversation. You can tell that you are exercising at a vigorous intensity if you are breathing much harder and faster and cannot hold a conversation while exercising. How can exercise benefit me? Exercising regularly is important. It has many health benefits, such as: Improving overall fitness, flexibility, and endurance. Increasing bone density. Helping with weight control. Decreasing body fat. Increasing muscle strength and endurance. Reducing stress and tension, anxiety, depression, or anger. Improving overall health. What guidelines should I follow while exercising? Before you start a new exercise program, talk with your health care provider. Do not exercise so much that you hurt yourself, feel dizzy, or get very short of breath. Wear comfortable clothes and wear shoes with good support. Drink plenty of water while you exercise to prevent dehydration or heat stroke. Work out until your breathing and your heartbeat get faster (moderate intensity). How often should I exercise? Choose an activity that you enjoy, and set realistic goals. Your health care provider can help you make an activity plan that is individually designed and works best for you. Exercise regularly as told by your health care provider. This may include: Doing strength training two times a week, such as: Lifting weights. Using resistance bands. Push-ups. Sit-ups. Yoga. Doing a certain intensity of exercise for a given amount of time. Choose from these options: A total of 150 minutes of moderate-intensity exercise every week. A total of 75 minutes of vigorous-intensity exercise every week. A mix of moderate-intensity and  vigorous-intensity exercise every week. Children, pregnant women, people who have not exercised regularly, people who are overweight, and older adults may need to talk with a health care provider about what activities are safe to perform. If you have a medical condition, be sure to talk with your health care provider before you start a new exercise program. What are some exercise ideas? Moderate-intensity exercise ideas include: Walking 1 mile (1.6 km) in about 15 minutes. Biking. Hiking. Golfing. Dancing. Water aerobics. Vigorous-intensity exercise ideas include: Walking 4.5 miles (7.2 km) or more in about 1 hour. Jogging or running 5 miles (8 km) in about 1 hour. Biking 10 miles (16.1 km) or more in about 1 hour. Lap swimming. Roller-skating or in-line skating. Cross-country skiing. Vigorous competitive sports, such as football, basketball, and soccer. Jumping rope. Aerobic dancing. What are some everyday activities that can help me get exercise? Yard work, such as: Pushing a lawn mower. Raking and bagging leaves. Washing your car. Pushing a stroller. Shoveling snow. Gardening. Washing windows or floors. How can I be more active in my day-to-day activities? Use stairs instead of an elevator. Take a walk during your lunch break. If you drive, park your car farther away from your work or school. If you take public transportation, get off one stop early and walk the rest of the way. Stand up or walk around during all of your indoor phone calls. Get up, stretch, and walk around every 30 minutes throughout the day. Enjoy exercise with a friend. Support to continue exercising will help you keep a regular routine of activity. Where to find more information You can find more information about exercising to stay healthy from: U.S. Department of Health and Human Services: www.hhs.gov Centers for Disease Control and Prevention (  CDC): www.cdc.gov Summary Exercising regularly is  important. It will improve your overall fitness, flexibility, and endurance. Regular exercise will also improve your overall health. It can help you control your weight, reduce stress, and improve your bone density. Do not exercise so much that you hurt yourself, feel dizzy, or get very short of breath. Before you start a new exercise program, talk with your health care provider. This information is not intended to replace advice given to you by your health care provider. Make sure you discuss any questions you have with your health care provider. Document Revised: 11/30/2020 Document Reviewed: 11/30/2020 Elsevier Patient Education  2024 Elsevier Inc.  

## 2023-07-31 NOTE — Progress Notes (Signed)
Subjective:    Patient ID: Donald Butler, male    DOB: March 21, 1961, 62 y.o.   MRN: [id number]   Chief Complaint: medical management of chronic issues     HPI:  Donald Butler is a 62 y.o. who identifies as a male who was assigned male at birth.   Social history: Lives with: wife Work history: retired   Water engineer in today for follow up of the following chronic medical issues:  1. Primary hypertension No c/o chest pain, sob or headache. Does not check blood pressure at home. BP Readings from Last 3 Encounters:  06/08/23 135/83  03/23/23 126/72  01/30/23 127/76     2. Coronary artery disease involving native coronary artery of native heart without angina pectoris 3. Paroxysmal atrial fibrillation [facility]) Patient saw cardiology on 09/10/22. They plan to continue risk reduction. No changes were made.  4. Diverticulosis No recent flare up  5. Gastroesophageal reflux disease, unspecified whether esophagitis present Takes omeprazole daily  6. Hyperlipidemia associated with type 2 diabetes mellitus (HCC) Does not watch diet and does no dedicated exercise. Lab Results  Component Value Date   CHOL 153 01/30/2023   HDL 69 01/30/2023   LDLCALC 70 01/30/2023   TRIG 75 01/30/2023   CHOLHDL 2.2 01/30/2023     7. Diet-controlled diabetes mellitus (HCC) Doe snot check blood sugars at home. Lab Results  Component Value Date   HGBA1C 5.1 01/30/2023     8. Gout, unspecified cause, unspecified chronicity, unspecified site Denies any recent flar eups  9. Morbid obesity (HCC) Weight is down 26lbs Wt Readings from Last 3 Encounters:  07/31/23 198 lb 12.8 oz (90.2 kg)  03/23/23 224 lb (101.6 kg)  10/17/22 224 lb (101.6 kg)   BMI Readings from Last 3 Encounters:  07/31/23 30.23 kg/m  06/08/23 34.06 kg/m  03/23/23 34.06 kg/m      New complaints: None today  Allergies  Allergen Reactions   Penicillins Hives, Shortness Of Breath and Swelling   Outpatient Encounter  Medications as of 07/31/2023  Medication Sig   Accu-Chek Softclix Lancets lancets Check BS morning , noon and bedtime Dx E11.9   albuterol (VENTOLIN HFA) 108 (90 Base) MCG/ACT inhaler Inhale 2 puffs into the lungs every 6 (six) hours as needed for wheezing or shortness of breath.   allopurinol (ZYLOPRIM) 300 MG tablet Take 1 tablet (300 mg total) by mouth daily.   amLODipine-benazepril (LOTREL) 5-40 MG capsule Take 2 capsules by mouth daily.   aspirin EC 81 MG tablet Take 81 mg by mouth daily.    atorvastatin (LIPITOR) 40 MG tablet Take 1 tablet (40 mg total) by mouth daily.   Blood Glucose Monitoring Suppl (ACCU-CHEK GUIDE ME) w/Device KIT Check BS morning , noon and bedtime Dx E11.9   Blood Glucose Monitoring Suppl DEVI 1 each by Does not apply route in the morning, at noon, and at bedtime. May substitute to any manufacturer covered by patient's insurance.   ciprofloxacin-dexamethasone (CIPRODEX) OTIC suspension Place 4 drops into both ears 2 (two) times daily.   colchicine 0.6 MG tablet TAKE 1 TABLET BY MOUTH ONCE DAILY AS NEEDED (Patient not taking: Reported on 06/08/2023)   doxycycline (VIBRA-TABS) 100 MG tablet Take 1 tablet (100 mg total) by mouth 2 (two) times daily. 1 po bid   glucose blood (ACCU-CHEK GUIDE) test strip Check BS morning , noon and bedtime Dx E11.9   HYDROcodone-acetaminophen (NORCO) 10-325 MG tablet Take 1 tablet by mouth every 8 (eight) hours as needed.  loratadine (CLARITIN) 10 MG tablet Take 10 mg by mouth daily.    Multiple Vitamin (MULTIVITAMIN) tablet Take 1 tablet by mouth daily.    omeprazole (PRILOSEC) 20 MG capsule TAKE 1 CAPSULE BY MOUTH TWICE DAILY ON AN EMPTY STOMACH, THEN WAIT ONE HOUR TO EAT   Semaglutide, 2 MG/DOSE, (OZEMPIC, 2 MG/DOSE,) 8 MG/3ML SOPN INJECT 2 MG SUBCUTANEOUSLY ONCE A WEEK   No facility-administered encounter medications on file as of 07/31/2023.    Past Surgical History:  Procedure Laterality Date   BACK SURGERY  1982, 1983    ruptured disk  x 2   CARPAL TUNNEL RELEASE Left    COLONOSCOPY     EXTERNAL EAR SURGERY     KNEE ARTHROSCOPY  2006, 2008   x 2  left   MASTOIDECTOMY  2013   right   OPEN ANTERIOR SHOULDER RECONSTRUCTION  1970   right   POLYPECTOMY     reconstruction left leg  2009   multiple surgery after accident    Family History  Problem Relation Age of Onset   Diabetes Father    Colon cancer Neg Hx    Stomach cancer Neg Hx    Esophageal cancer Neg Hx    Rectal cancer Neg Hx    Pancreatic cancer Neg Hx       Controlled substance contract: n/a     Review of Systems  Constitutional:  Negative for diaphoresis.  Eyes:  Negative for pain.  Respiratory:  Negative for shortness of breath.   Cardiovascular:  Negative for chest pain, palpitations and leg swelling.  Gastrointestinal:  Negative for abdominal pain.  Endocrine: Negative for polydipsia.  Skin:  Negative for rash.  Neurological:  Negative for dizziness, weakness and headaches.  Hematological:  Does not bruise/bleed easily.  All other systems reviewed and are negative.      Objective:   Physical Exam Vitals and nursing note reviewed.  Constitutional:      Appearance: Normal appearance. He is well-developed.  HENT:     Head: Normocephalic.     Nose: Nose normal.     Mouth/Throat:     Mouth: Mucous membranes are moist.     Pharynx: Oropharynx is clear.  Eyes:     Pupils: Pupils are equal, round, and reactive to light.  Neck:     Thyroid: No thyroid mass or thyromegaly.     Vascular: No carotid bruit or JVD.     Trachea: Phonation normal.  Cardiovascular:     Rate and Rhythm: Normal rate and regular rhythm.  Pulmonary:     Effort: Pulmonary effort is normal. No respiratory distress.     Breath sounds: Normal breath sounds.  Abdominal:     General: Bowel sounds are normal.     Palpations: Abdomen is soft.     Tenderness: There is no abdominal tenderness.  Musculoskeletal:        General: Normal range of motion.      Cervical back: Normal range of motion and neck supple.  Lymphadenopathy:     Cervical: No cervical adenopathy.  Skin:    General: Skin is warm and dry.  Neurological:     Mental Status: He is alert and oriented to person, place, and time.  Psychiatric:        Behavior: Behavior normal.        Thought Content: Thought content normal.        Judgment: Judgment normal.     BP 121/79   Pulse 75  Temp 97.7 F (36.5 C) (Temporal)   Ht 5\' 8"  (1.727 m)   Wt 198 lb 12.8 oz (90.2 kg)   SpO2 98%   BMI 30.23 kg/m    Hgba1c discussed at appointment-5.0%      Assessment & Plan:   Donald Butler comes in today with chief complaint of Medical Management of Chronic Issues   Diagnosis and orders addressed:  1. Primary hypertension (Primary) Low sodium diet - CBC with Differential/Platelet - CMP14+EGFR - amLODipine-benazepril (LOTREL) 5-40 MG capsule; Take 2 capsules by mouth daily.  Dispense: 180 capsule; Refill: 1  2. Coronary artery disease involving native coronary artery of native heart without angina pectoris Keep follow up with cardiology  3. Paroxysmal atrial fibrillation (HCC) Avoid caffeine  4. Diverticulosis Watch diet to prevent flare up  5. Gastroesophageal reflux disease, unspecified whether esophagitis present Avoid spicy foods Do not eat 2 hours prior to bedtime - omeprazole (PRILOSEC) 20 MG capsule; TAKE 1 CAPSULE BY MOUTH TWICE DAILY ON AN EMPTY STOMACH, THEN WAIT ONE HOUR TO EAT  Dispense: 180 capsule; Refill: 1  6. Hyperlipidemia associated with type 2 diabetes mellitus (HCC) Low fat diet - Lipid panel - atorvastatin (LIPITOR) 40 MG tablet; Take 1 tablet (40 mg total) by mouth daily.  Dispense: 90 tablet; Refill: 3  7. Gout, unspecified cause, unspecified chronicity, unspecified site - allopurinol (ZYLOPRIM) 300 MG tablet; Take 1 tablet (300 mg total) by mouth daily.  Dispense: 90 tablet; Refill: 1  8. Morbid obesity (HCC) Discussed diet and  exercise for person with BMI >25 Will recheck weight in 3-6 months   9. Diabetes mellitus treated with injections of non-insulin medication (HCC) Low carb diet - Bayer DCA Hb A1c Waived - Microalbumin / creatinine urine ratio - Semaglutide, 2 MG/DOSE, (OZEMPIC, 2 MG/DOSE,) 8 MG/3ML SOPN; Inject 2 mg into the skin once a week.  Dispense: 9 mL; Refill: 1   Labs pending Health Maintenance reviewed Diet and exercise encouraged  Follow up plan: 6 months   [name] Daphine Deutscher, FNP

## 2023-08-01 LAB — CMP14+EGFR
ALT: 29 IU/L (ref 0–44)
AST: 32 IU/L (ref 0–40)
Albumin: 4.6 g/dL (ref 3.9–4.9)
Alkaline Phosphatase: 102 IU/L (ref 44–121)
BUN/Creatinine Ratio: 22 (ref 10–24)
BUN: 17 mg/dL (ref 8–27)
Bilirubin Total: 0.5 mg/dL (ref 0.0–1.2)
CO2: 24 mmol/L (ref 20–29)
Calcium: 9.4 mg/dL (ref 8.6–10.2)
Chloride: 102 mmol/L (ref 96–106)
Creatinine, Ser: 0.76 mg/dL (ref 0.76–1.27)
Globulin, Total: 2.2 g/dL (ref 1.5–4.5)
Glucose: 90 mg/dL (ref 70–99)
Potassium: 4.6 mmol/L (ref 3.5–5.2)
Sodium: 141 mmol/L (ref 134–144)
Total Protein: 6.8 g/dL (ref 6.0–8.5)
eGFR: 101 mL/min/{1.73_m2} (ref >59)

## 2023-08-01 LAB — CBC WITH DIFFERENTIAL/PLATELET
Basophils Absolute: 0.1 10*3/uL (ref 0.0–0.2)
Basos: 1 %
EOS (ABSOLUTE): 0.5 10*3/uL — ABNORMAL HIGH (ref 0.0–0.4)
Eos: 7 %
Hematocrit: 44.2 % (ref 37.5–51.0)
Hemoglobin: 14.7 g/dL (ref 13.0–17.7)
Immature Grans (Abs): 0 10*3/uL (ref 0.0–0.1)
Immature Granulocytes: 0 %
Lymphocytes Absolute: 1.7 10*3/uL (ref 0.7–3.1)
Lymphs: 27 %
MCH: 32.5 pg (ref 26.6–33.0)
MCHC: 33.3 g/dL (ref 31.5–35.7)
MCV: 98 fL — ABNORMAL HIGH (ref 79–97)
Monocytes Absolute: 0.6 10*3/uL (ref 0.1–0.9)
Monocytes: 10 %
Neutrophils Absolute: 3.6 10*3/uL (ref 1.4–7.0)
Neutrophils: 55 %
Platelets: 236 10*3/uL (ref 150–450)
RBC: 4.53 x10E6/uL (ref 4.14–5.80)
RDW: 12 % (ref 11.6–15.4)
WBC: 6.5 10*3/uL (ref 3.4–10.8)

## 2023-08-01 LAB — LIPID PANEL
Cholesterol, Total: 151 mg/dL (ref 100–199)
HDL: 75 mg/dL (ref >39)
LDL CALC COMMENT:: 2 ratio (ref 0.0–5.0)
LDL Chol Calc (NIH): 61 mg/dL (ref 0–99)
Triglycerides: 78 mg/dL (ref 0–149)
VLDL Cholesterol Cal: 15 mg/dL (ref 5–40)

## 2023-08-02 LAB — MICROALBUMIN / CREATININE URINE RATIO
Creatinine, Urine: 143.2 mg/dL
Microalb/Creat Ratio: 11 mg/g{creat} (ref 0–29)
Microalbumin, Urine: 16 ug/mL

## 2023-08-18 ENCOUNTER — Telehealth: Payer: Self-pay

## 2023-08-18 ENCOUNTER — Other Ambulatory Visit (HOSPITAL_COMMUNITY): Payer: Self-pay

## 2023-08-18 NOTE — Telephone Encounter (Signed)
 Pharmacy Patient Advocate Encounter   Received notification from CoverMyMeds that prior authorization for Ozempic  (1 MG/DOSE) 4MG /3ML pen-injectors is required/requested.   Insurance verification completed.   The patient is insured through [facility] .   Per test claim: The current 28 day co-pay is, $243.53, due to coverage gap.  No PA needed at this time. This test claim was processed through [facility]- copay amounts may vary at other pharmacies due to pharmacy/plan contracts, or as the patient moves through the different stages of their insurance plan.

## 2023-08-31 ENCOUNTER — Ambulatory Visit (INDEPENDENT_AMBULATORY_CARE_PROVIDER_SITE_OTHER): Payer: Medicare Other | Admitting: Nurse Practitioner

## 2023-08-31 ENCOUNTER — Encounter: Payer: Self-pay | Admitting: Nurse Practitioner

## 2023-08-31 ENCOUNTER — Ambulatory Visit (INDEPENDENT_AMBULATORY_CARE_PROVIDER_SITE_OTHER): Payer: Medicare Other

## 2023-08-31 VITALS — BP 134/80 | HR 88 | Temp 98.7°F | Resp 20 | Ht 68.0 in | Wt 196.0 lb

## 2023-08-31 DIAGNOSIS — R0989 Other specified symptoms and signs involving the circulatory and respiratory systems: Secondary | ICD-10-CM | POA: Diagnosis not present

## 2023-08-31 DIAGNOSIS — J189 Pneumonia, unspecified organism: Secondary | ICD-10-CM | POA: Diagnosis not present

## 2023-08-31 MED ORDER — PREDNISONE 20 MG PO TABS: 40.0000 mg | ORAL_TABLET | Freq: Every day | ORAL | 0 refills | Status: AC

## 2023-08-31 MED ORDER — CEFDINIR 300 MG PO CAPS: 300.0000 mg | ORAL_CAPSULE | Freq: Two times a day (BID) | ORAL | 0 refills | Status: DC

## 2023-08-31 MED ORDER — AZITHROMYCIN 250 MG PO TABS: ORAL_TABLET | ORAL | 0 refills | Status: DC

## 2023-08-31 MED ORDER — HYDROCODONE BIT-HOMATROP MBR 5-1.5 MG/5ML PO SOLN: 5.0000 mL | Freq: Three times a day (TID) | ORAL | 0 refills | Status: DC | PRN

## 2023-08-31 NOTE — Patient Instructions (Signed)

## 2023-08-31 NOTE — Progress Notes (Signed)
 Subjective:    Patient ID: Donald Butler, male    DOB: 09-20-1960, 63 y.o.   MRN: [id number]   Chief Complaint: cough  Cough This is a new problem. The current episode started 1 to 4 weeks ago. The problem has been waxing and waning. The problem occurs constantly. The cough is Productive of sputum. Associated symptoms include chills, ear congestion, a fever, rhinorrhea, a sore throat and shortness of breath. Pertinent negatives include no chest pain, headaches or rash. Nothing aggravates the symptoms. He has tried OTC cough suppressant for the symptoms. The treatment provided mild relief.    Patient Active Problem List   Diagnosis Date Noted   Diabetes mellitus treated with injections of non-insulin medication (HCC) 10/17/2022   Paroxysmal atrial fibrillation (HCC) 09/07/2022   Elevated coronary artery calcium  score 12/10/2021   Coronary artery disease involving native coronary artery of native heart without angina pectoris 11/01/2020   Morbid obesity (HCC) 12/16/2019   Diverticulosis 05/13/2018   GERD (gastroesophageal reflux disease) 05/29/2016   HTN (hypertension) 05/29/2016   Gout 05/29/2016   Compression of common peroneal nerve, left 05/29/2016   Hyperlipidemia associated with type 2 diabetes mellitus (HCC) 05/29/2016    Lab Results  Component Value Date   HGBA1C 5.0 07/31/2023      Review of Systems  Constitutional:  Positive for chills and fever. Negative for diaphoresis.  HENT:  Positive for rhinorrhea and sore throat.   Eyes:  Negative for pain.  Respiratory:  Positive for cough and shortness of breath.   Cardiovascular:  Negative for chest pain, palpitations and leg swelling.  Gastrointestinal:  Negative for abdominal pain.  Endocrine: Negative for polydipsia.  Skin:  Negative for rash.  Neurological:  Negative for dizziness, weakness and headaches.  Hematological:  Does not bruise/bleed easily.  All other systems reviewed and are negative.      Objective:    Physical Exam Constitutional:      Appearance: Normal appearance.  HENT:     Right Ear: Tympanic membrane normal.     Left Ear: Tympanic membrane normal.     Nose: Congestion and rhinorrhea present.     Mouth/Throat:     Mouth: Mucous membranes are moist.  Cardiovascular:     Rate and Rhythm: Normal rate.  Pulmonary:     Breath sounds: Rhonchi and rales (right lower lobe) present.  Abdominal:     General: Abdomen is flat.     Palpations: Abdomen is soft.  Neurological:     Mental Status: He is alert.       BP 134/80   Pulse 88   Temp 98.7 F (37.1 C) (Temporal)   Resp 20   Ht 5' 8 (1.727 m)   Wt 196 lb (88.9 kg)   SpO2 98%   BMI 29.80 kg/m   Right lower lobe infiltrate-Preliminary reading by Ronal Lunger, FNP  [facility]     Assessment & Plan:   Bonni MARLA [place] in today with chief complaint of chest congestion (Coughing up blood/)   1. Chest congestion (Primary) - DG Chest 2 View  2. Community acquired pneumonia of right lower lobe of lung 1. Take meds as prescribed 2. Use a cool mist humidifier especially during the winter months and when heat has been humid. 3. Use saline nose sprays frequently 4. Saline irrigations of the nose can be very helpful if done frequently.  * 4X daily for 1 week*  * Use of a nettie pot can be helpful with this.  Follow directions with this* 5. Drink plenty of fluids 6. Keep thermostat turn down low 7.For any cough or congestion- hycodan and mucinex 8. For fever or aces or pains- take tylenol  or ibuprofen appropriate for age and weight.  * for fevers greater than 101 orally you may alternate ibuprofen and tylenol  every  3 hours.    - predniSONE  (DELTASONE ) 20 MG tablet; Take 2 tablets (40 mg total) by mouth daily with breakfast for 5 days. 2 po daily for 5 days  Dispense: 10 tablet; Refill: 0 - cefdinir  (OMNICEF ) 300 MG capsule; Take 1 capsule (300 mg total) by mouth 2 (two) times daily. 1 po BID  Dispense: 20 capsule; Refill: 0 -  azithromycin  (ZITHROMAX  Z-PAK) 250 MG tablet; As directed  Dispense: 6 tablet; Refill: 0 - HYDROcodone  bit-homatropine (HYCODAN) 5-1.5 MG/5ML syrup; Take 5 mLs by mouth every 8 (eight) hours as needed for cough.  Dispense: 120 mL; Refill: 0    The above assessment and management plan was discussed with the patient. The patient verbalized understanding of and has agreed to the management plan. Patient is aware to call the clinic if symptoms persist or worsen. Patient is aware when to return to the clinic for a follow-up visit. Patient educated on when it is appropriate to go to the emergency department.   [name] Gladis, FNP

## 2023-09-15 ENCOUNTER — Ambulatory Visit: Payer: Medicare Other

## 2023-09-15 VITALS — Ht 68.0 in | Wt 196.0 lb

## 2023-09-15 DIAGNOSIS — Z0000 Encounter for general adult medical examination without abnormal findings: Secondary | ICD-10-CM | POA: Diagnosis not present

## 2023-09-15 NOTE — Progress Notes (Signed)
Subjective:   Donald Butler is a 63 y.o. male who presents for Medicare Annual/Subsequent preventive examination.  Visit Complete: Virtual I connected with  Tonia Ghent on 09/15/23 by a audio enabled telemedicine application and verified that I am speaking with the correct person using two identifiers.  Patient Location: Home  Provider Location: Home Office  This patient declined Interactive audio and video telecommunications. Therefore the visit was completed with audio only.  I discussed the limitations of evaluation and management by telemedicine. The patient expressed understanding and agreed to proceed.  Vital Signs: Because this visit was a virtual/telehealth visit, some criteria may be missing or patient reported. Any vitals not documented were not able to be obtained and vitals that have been documented are patient reported.  Cardiac Risk Factors include: advanced age (>24men, >72 women);diabetes mellitus;male gender;hypertension     Objective:    Today's Vitals   09/15/23 1736  Weight: 196 lb (88.9 kg)  Height: 5\' 8"  (1.727 m)   Body mass index is 29.8 kg/m.     09/15/2023    5:41 PM 09/12/2022    2:15 PM 07/25/2021    1:28 PM  Advanced Directives  Does Patient Have a Medical Advance Directive? No Yes No  Type of Special educational needs teacher of [place];Living will   Copy of Healthcare Power of Attorney in Chart?  No - copy requested   Would patient like information on creating a medical advance directive? Yes (MAU/Ambulatory/Procedural Areas - Information given)  No - Patient declined    Current Medications (verified) Outpatient Encounter Medications as of 09/15/2023  Medication Sig   Accu-Chek Softclix Lancets lancets Check BS morning , noon and bedtime Dx E11.9   albuterol (VENTOLIN HFA) 108 (90 Base) MCG/ACT inhaler Inhale 2 puffs into the lungs every 6 (six) hours as needed for wheezing or shortness of breath.   allopurinol (ZYLOPRIM) 300 MG tablet  Take 1 tablet (300 mg total) by mouth daily.   amLODipine-benazepril (LOTREL) 5-40 MG capsule Take 2 capsules by mouth daily.   aspirin EC 81 MG tablet Take 81 mg by mouth daily.    atorvastatin (LIPITOR) 40 MG tablet Take 1 tablet (40 mg total) by mouth daily.   azithromycin (ZITHROMAX Z-PAK) 250 MG tablet As directed   Blood Glucose Monitoring Suppl (ACCU-CHEK GUIDE ME) w/Device KIT Check BS morning , noon and bedtime Dx E11.9   Blood Glucose Monitoring Suppl DEVI 1 each by Does not apply route in the morning, at noon, and at bedtime. May substitute to any manufacturer covered by patient's insurance.   colchicine 0.6 MG tablet TAKE 1 TABLET BY MOUTH ONCE DAILY AS NEEDED   glucose blood (ACCU-CHEK GUIDE) test strip Check BS morning , noon and bedtime Dx E11.9   HYDROcodone bit-homatropine (HYCODAN) 5-1.5 MG/5ML syrup Take 5 mLs by mouth every 8 (eight) hours as needed for cough.   HYDROcodone-acetaminophen (NORCO) 10-325 MG tablet Take 1 tablet by mouth every 8 (eight) hours as needed.   loratadine (CLARITIN) 10 MG tablet Take 10 mg by mouth daily.    Multiple Vitamin (MULTIVITAMIN) tablet Take 1 tablet by mouth daily.    omeprazole (PRILOSEC) 20 MG capsule TAKE 1 CAPSULE BY MOUTH TWICE DAILY ON AN EMPTY STOMACH, THEN WAIT ONE HOUR TO EAT   Semaglutide, 2 MG/DOSE, (OZEMPIC, 2 MG/DOSE,) 8 MG/3ML SOPN Inject 2 mg into the skin once a week.   cefdinir (OMNICEF) 300 MG capsule Take 1 capsule (300 mg total) by mouth  2 (two) times daily. 1 po BID (Patient not taking: Reported on 09/15/2023)   No facility-administered encounter medications on file as of 09/15/2023.    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   Past Surgical History:  Procedure Laterality Date   BACK SURGERY  1982, 1983   ruptured disk  x 2   CARPAL TUNNEL RELEASE Left    COLONOSCOPY     EXTERNAL EAR SURGERY     KNEE ARTHROSCOPY  2006, 2008   x 2  left   MASTOIDECTOMY  2013   right   OPEN ANTERIOR SHOULDER RECONSTRUCTION  1970   right   POLYPECTOMY     reconstruction left leg  2009   multiple surgery after accident   Family History  Problem Relation Age of Onset   Diabetes Father    Colon cancer Neg Hx    Stomach cancer Neg Hx    Esophageal cancer Neg Hx    Rectal cancer Neg Hx    Pancreatic cancer Neg Hx    Social History   Socioeconomic History   Marital status: Married    Spouse name: Not on file   Number of children: Not on file   Years of education: Not on file   Highest education level: Not on file  Occupational History   Not on file  Tobacco Use   Smoking status: Former   Smokeless tobacco: Former    Types: Chew    Quit date: 06/22/2012  Vaping Use   Vaping status: Never Used  Substance and Sexual Activity   Alcohol use: Yes    Alcohol/week: 21.0 standard drinks of alcohol    Types: 21 Standard drinks or equivalent per week    Comment: occ   Drug use: No   Sexual activity: Not on file  Other Topics Concern   Not on file  Social History Narrative   Not on file   Social Drivers of Health   Financial Resource Strain: Low Risk  (09/15/2023)   Overall Financial Resource Strain (CARDIA)    Difficulty of Paying Living Expenses: Not hard at all  Food Insecurity: No Food Insecurity (09/15/2023)   Hunger Vital Sign    Worried About Running Out of Food in the Last Year: Never true    Ran Out of Food in the Last Year: Never true  Transportation Needs: No Transportation Needs (09/12/2022)   PRAPARE - Administrator, Civil Service (Medical): No    Lack of Transportation (Non-Medical): No  Physical Activity: Sufficiently Active (09/15/2023)   Exercise Vital  Sign    Days of Exercise per Week: 5 days    Minutes of Exercise per Session: 30 min  Stress: No Stress Concern Present (09/15/2023)   [employer] of Occupational Health - Occupational Stress Questionnaire    Feeling of Stress : Not at all  Social Connections: Moderately Isolated (09/15/2023)   Social Connection and Isolation Panel [NHANES]    Frequency of Communication with Friends and Family: More than three times a week    Frequency of Social Gatherings with Friends and Family: Three times a week  Attends Religious Services: Never    Active Member of Clubs or Organizations: No    Attends Banker Meetings: Never    Marital Status: Married    Tobacco Counseling Counseling given: Not Answered   Clinical Intake:  Pre-visit preparation completed: Yes  Pain : No/denies pain     Diabetes: Yes CBG done?: No Did pt. bring in CBG monitor from home?: No  How often do you need to have someone help you when you read instructions, pamphlets, or other written materials from your doctor or pharmacy?: 1 - Never  Interpreter Needed?: No  Information entered by :: Kandis Fantasia LPN   Activities of Daily Living    09/15/2023    5:37 PM  In your present state of health, do you have any difficulty performing the following activities:  Hearing? 0  Vision? 0  Difficulty concentrating or making decisions? 0  Walking or climbing stairs? 0  Dressing or bathing? 0  Doing errands, shopping? 0  Preparing Food and eating ? N  Using the Toilet? N  In the past six months, have you accidently leaked urine? N  Do you have problems with loss of bowel control? N  Managing your Medications? N  Managing your Finances? N  Housekeeping or managing your Housekeeping? N    Patient Care Team: Bennie Pierini, FNP as PCP - General (Family Medicine) Rollene Rotunda, MD as PCP - Cardiology (Cardiology)  Indicate any recent Medical Services you may have received from other  than [facility] providers in the past year (date may be approximate).     Assessment:   This is a routine wellness examination for [place].  Hearing/Vision screen Hearing Screening - Comments:: Denies hearing difficulties   Vision Screening - Comments:: Wears rx glasses - up to date with routine eye exams with [facility]     Goals Addressed             This Visit's Progress    COMPLETED: Patient Stated       07/25/2021 AWV Goal: Exercise for General Health  Patient will verbalize understanding of the benefits of increased physical activity: Exercising regularly is important. It will improve your overall fitness, flexibility, and endurance. Regular exercise also will improve your overall health. It can help you control your weight, reduce stress, and improve your bone density. Over the next year, patient will increase physical activity as tolerated with a goal of at least 150 minutes of moderate physical activity per week.  You can tell that you are exercising at a moderate intensity if your heart starts beating faster and you start breathing faster but can still hold a conversation. Moderate-intensity exercise ideas include: Walking 1 mile (1.6 km) in about 15 minutes Biking Hiking Golfing Dancing Water aerobics Patient will verbalize understanding of everyday activities that increase physical activity by providing examples like the following: Yard work, such as: Insurance underwriter Gardening Washing windows or floors Patient will be able to explain general safety guidelines for exercising:  Before you start a new exercise program, talk with your health care provider. Do not exercise so much that you hurt yourself, feel dizzy, or get very short of breath. Wear comfortable clothes and wear shoes with good support. Drink plenty of water while you exercise to prevent dehydration or heat  stroke. Work out until your breathing and your heartbeat get faster.      Remain active and independent  Depression Screen    09/15/2023    5:40 PM 07/31/2023    8:22 AM 03/23/2023    9:00 AM 10/17/2022   10:36 AM 09/12/2022    2:15 PM 08/25/2022    9:22 AM 07/04/2022    8:53 AM  PHQ 2/9 Scores  PHQ - 2 Score 0 0 0 0 0 0 0  PHQ- 9 Score 0 0 0 0 0 0 0    Fall Risk    09/15/2023    5:42 PM 07/31/2023    8:22 AM 03/23/2023    9:00 AM 10/17/2022   10:36 AM 09/12/2022    2:04 PM  Fall Risk   Falls in the past year? 0 0 0 0 0  Number falls in past yr: 0    0  Injury with Fall? 0    0  Risk for fall due to : No Fall Risks    No Fall Risks  Follow up Falls prevention discussed;Education provided;Falls evaluation completed    Falls prevention discussed    MEDICARE RISK AT HOME: Medicare Risk at Home Any stairs in or around the home?: No If so, are there any without handrails?: No Home free of loose throw rugs in walkways, pet beds, electrical cords, etc?: Yes Adequate lighting in your home to reduce risk of falls?: Yes Life alert?: No Use of a cane, walker or w/c?: No Grab bars in the bathroom?: Yes Shower chair or bench in shower?: No Elevated toilet seat or a handicapped toilet?: Yes  TIMED UP AND GO:  Was the test performed?  No    Cognitive Function:        09/15/2023    5:42 PM 09/12/2022    2:16 PM 07/25/2021    1:29 PM  6CIT Screen  What Year? 0 points 0 points 0 points  What month? 0 points 0 points 0 points  What time? 0 points 0 points 0 points  Count back from 20 0 points 0 points 0 points  Months in reverse 0 points 0 points 0 points  Repeat phrase 0 points 0 points 0 points  Total Score 0 points 0 points 0 points    Immunizations Immunization History  Administered Date(s) Administered   Moderna Sars-Covid-2 Vaccination 11/17/2019, 12/17/2019   Tdap 08/25/2022    TDAP status: Up to date  Flu Vaccine status: Declined, Education has been provided  regarding the importance of this vaccine but patient still declined. Advised may receive this vaccine at local pharmacy or Health Dept. Aware to provide a copy of the vaccination record if obtained from local pharmacy or Health Dept. Verbalized acceptance and understanding.  Pneumococcal vaccine status: Declined,  Education has been provided regarding the importance of this vaccine but patient still declined. Advised may receive this vaccine at local pharmacy or Health Dept. Aware to provide a copy of the vaccination record if obtained from local pharmacy or Health Dept. Verbalized acceptance and understanding.   Covid-19 vaccine status: Declined, Education has been provided regarding the importance of this vaccine but patient still declined. Advised may receive this vaccine at local pharmacy or Health Dept.or vaccine clinic. Aware to provide a copy of the vaccination record if obtained from local pharmacy or Health Dept. Verbalized acceptance and understanding.  Qualifies for Shingles Vaccine? Yes   Zostavax completed No   Shingrix Completed?: No.    Education has been provided regarding the importance of this vaccine. Patient has been advised to call insurance company to determine out of pocket  expense if they have not yet received this vaccine. Advised may also receive vaccine at local pharmacy or Health Dept. Verbalized acceptance and understanding.  Screening Tests Health Maintenance  Topic Date Due   Pneumococcal Vaccine 50-83 Years old (1 of 2 - PCV) Never done   HIV Screening  Never done   Hepatitis C Screening  Never done   COVID-19 Vaccine (3 - Moderna risk series) 01/14/2020   Zoster Vaccines- Shingrix (1 of 2) 10/29/2023 (Originally 03/05/1980)   INFLUENZA VACCINE  11/16/2023 (Originally 03/19/2023)   HEMOGLOBIN A1C  01/29/2024   FOOT EXAM  01/30/2024   OPHTHALMOLOGY EXAM  03/03/2024   Colonoscopy  06/22/2024   Diabetic kidney evaluation - eGFR measurement  07/30/2024   Diabetic  kidney evaluation - Urine ACR  07/30/2024   Medicare Annual Wellness (AWV)  09/14/2024   DTaP/Tdap/Td (2 - Td or Tdap) 08/25/2032   HPV VACCINES  Aged Out    Health Maintenance  Health Maintenance Due  Topic Date Due   Pneumococcal Vaccine 4-58 Years old (1 of 2 - PCV) Never done   HIV Screening  Never done   Hepatitis C Screening  Never done   COVID-19 Vaccine (3 - Moderna risk series) 01/14/2020    Colorectal cancer screening: Type of screening: Colonoscopy. Completed 06/22/17. Repeat every 7 years  Lung Cancer Screening: (Low Dose CT Chest recommended if Age 106-80 years, 20 pack-year currently smoking OR have quit w/in 15years.) does not qualify.   Lung Cancer Screening Referral: n/a  Additional Screening:  Hepatitis C Screening: does qualify;  Vision Screening: Recommended annual ophthalmology exams for early detection of glaucoma and other disorders of the eye. Is the patient up to date with their annual eye exam?  Yes  Who is the provider or what is the name of the office in which the patient attends annual eye exams? [facility]  If pt is not established with a provider, would they like to be referred to a provider to establish care? No .   Dental Screening: Recommended annual dental exams for proper oral hygiene  Diabetic Foot Exam: Diabetic Foot Exam: Completed 01/30/23  Community Resource Referral / Chronic Care Management: CRR required this visit?  No   CCM required this visit?  No     Plan:     I have personally reviewed and noted the following in the patient's chart:   Medical and social history Use of alcohol, tobacco or illicit drugs  Current medications and supplements including opioid prescriptions. Patient is not currently taking opioid prescriptions. Functional ability and status Nutritional status Physical activity Advanced directives List of other physicians Hospitalizations, surgeries, and ER visits in previous 12  months Vitals Screenings to include cognitive, depression, and falls Referrals and appointments  In addition, I have reviewed and discussed with patient certain preventive protocols, quality metrics, and best practice recommendations. A written personalized care plan for preventive services as well as general preventive health recommendations were provided to patient.     Kandis Fantasia [place], [place]   1/61/0960   After Visit Summary: (MyChart) Due to this being a telephonic visit, the after visit summary with patients personalized plan was offered to patient via MyChart   Nurse Notes: No concerns at this time

## 2023-09-15 NOTE — Patient Instructions (Signed)
Mr. Pettway , Thank you for taking time to come for your Medicare Wellness Visit. I appreciate your ongoing commitment to your health goals. Please review the following plan we discussed and let me know if I can assist you in the future.   Referrals/Orders/Follow-Ups/Clinician Recommendations: Aim for 30 minutes of exercise or brisk walking, 6-8 glasses of water, and 5 servings of fruits and vegetables each day.  This is a list of the screening recommended for you and due dates:  Health Maintenance  Topic Date Due   Pneumococcal Vaccination (1 of 2 - PCV) Never done   HIV Screening  Never done   Hepatitis C Screening  Never done   COVID-19 Vaccine (3 - Moderna risk series) 01/14/2020   Zoster (Shingles) Vaccine (1 of 2) 10/29/2023*   Flu Shot  11/16/2023*   Hemoglobin A1C  01/29/2024   Complete foot exam   01/30/2024   Eye exam for diabetics  03/03/2024   Colon Cancer Screening  06/22/2024   Yearly kidney function blood test for diabetes  07/30/2024   Yearly kidney health urinalysis for diabetes  07/30/2024   Medicare Annual Wellness Visit  09/14/2024   DTaP/Tdap/Td vaccine (2 - Td or Tdap) 08/25/2032   HPV Vaccine  Aged Out  *Topic was postponed. The date shown is not the original due date.    Advanced directives: (ACP Link)Information on Advanced Care Planning can be found at [facility] Advance Health Care Directives Advance Health Care Directives ([email or web address]   Next Medicare Annual Wellness Visit scheduled for next year: Yes

## 2023-11-08 NOTE — Progress Notes (Unsigned)
 Cardiology Office Note:   Date:  11/11/2023  ID:  Donald Butler, DOB 1960/12/02, MRN [id number] PCP: Bennie Pierini, FNP  [facility] Providers Cardiologist:  Rollene Rotunda, MD {h  History of Present Illness:   Donald Butler is a 63 y.o. male  who presents for dizziness.    Coronary CT angiography showed a coronary artery calcium score of 856 Agatston units placing him in the 97th percentile for age and gender, suggesting a high risk for future cardiac events.  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 Echocardiogram on 06/17/2018 showed normal left ventricular systolic function and regional wall motion, LVEF 60 to 65%, and mild mitral regurgitation.   He did have some palpitations in June 2023 had a monitor which a very short run of paroxysmal atrial fibrillation.   Since I last saw him he has done OK.  The patient denies any new symptoms such as chest discomfort, neck or arm discomfort. There has been no new shortness of breath, PND or orthopnea. There have been no reported palpitations, presyncope or syncope.  He did go to [place] on a cruise and he got sick.  He had a GI issue and he knows that he had some paroxysmal A-fib at that time.  Otherwise he been doing well.  He gone on a diet and lost about 40 pounds.  He has changed the way he is eating.  He staying physically active. The patient denies any new symptoms such as chest discomfort, neck or arm discomfort. There has been no new shortness of breath, PND or orthopnea. There have been no reported palpitations, presyncope or syncope.    ROS: As stated in the HPI and negative for all other systems.  Studies Reviewed:    EKG:   EKG Interpretation Date/Time:  Wednesday November 11 2023 11:22:39 EDT Ventricular Rate:  71 PR Interval:  150 QRS Duration:  86 QT Interval:  404 QTC Calculation: 439 R Axis:   80  Text Interpretation: Normal sinus rhythm  Normal ECG When compared with ECG of 27-Jun-2002 11:22, No significant change was found Confirmed by Rollene Rotunda ([id number]) on 11/11/2023 11:44:11 AM    Risk Assessment/Calculations:    CHA2DS2-VASc Score = 1   This indicates a 0.6% annual risk of stroke. The patient's score is based upon: CHF History: 0 HTN History: 0 Diabetes History: 1 Stroke History: 0 Vascular Disease History: 0 Age Score: 0 Gender Score: 0              Physical Exam:   VS:  BP 130/78   Pulse 71   Ht 5\' 8"  (1.727 m)   Wt 204 lb (92.5 kg)   BMI 31.02 kg/m    Wt Readings from Last 3 Encounters:  11/11/23 204 lb (92.5 kg)  09/15/23 196 lb (88.9 kg)  08/31/23 196 lb (88.9 kg)     GEN: Well nourished, well developed in no acute distress NECK: No JVD; No carotid bruits CARDIAC: RRR, no murmurs, rubs, gallops RESPIRATORY:  Clear to auscultation without rales, wheezing or rhonchi  ABDOMEN: Soft, non-tender, non-distended EXTREMITIES:  No edema; No deformity   ASSESSMENT AND PLAN:   Elevated coronary calcium :   The patient has no new sypmtoms.  No further cardiovascular testing is indicated.  We will continue with aggressive risk reduction and meds as listed.  Hypertension:    His blood pressure is at target.  No change in therapy.  Mixed dyslipidemia:   61 with an HDL of 75.  No change in therapy.   Atrial fibrillation: He has infrequent paroxysms and a low thromboembolic score and we have elected not to use anticoagulation.  Obesity:   I applaud his weight loss which has required quite a bit of effort.  No change in therapy.   Elevated blood sugar:  A1C was down to 5.0.  He is on Ozempic.  No change in therapy.  Follow up with me in 1 year.  Signed, Rollene Rotunda, MD

## 2023-11-11 ENCOUNTER — Ambulatory Visit: Payer: Medicare Other | Admitting: Cardiology

## 2023-11-11 ENCOUNTER — Encounter: Payer: Self-pay | Admitting: Cardiology

## 2023-11-11 VITALS — BP 130/78 | HR 71 | Ht 68.0 in | Wt 204.0 lb

## 2023-11-11 DIAGNOSIS — R931 Abnormal findings on diagnostic imaging of heart and coronary circulation: Secondary | ICD-10-CM | POA: Diagnosis not present

## 2023-11-11 DIAGNOSIS — E785 Hyperlipidemia, unspecified: Secondary | ICD-10-CM | POA: Diagnosis not present

## 2023-11-11 DIAGNOSIS — I10 Essential (primary) hypertension: Secondary | ICD-10-CM | POA: Diagnosis not present

## 2023-11-11 DIAGNOSIS — I480 Paroxysmal atrial fibrillation: Secondary | ICD-10-CM | POA: Diagnosis not present

## 2023-11-11 NOTE — Patient Instructions (Signed)

## 2024-01-29 ENCOUNTER — Ambulatory Visit: Payer: Self-pay | Admitting: Nurse Practitioner

## 2024-01-29 ENCOUNTER — Encounter: Payer: Self-pay | Admitting: Nurse Practitioner

## 2024-01-29 ENCOUNTER — Ambulatory Visit (INDEPENDENT_AMBULATORY_CARE_PROVIDER_SITE_OTHER): Payer: Medicare Other | Admitting: Nurse Practitioner

## 2024-01-29 VITALS — BP 135/75 | HR 70 | Temp 98.1°F | Ht 68.0 in | Wt 207.0 lb

## 2024-01-29 DIAGNOSIS — E785 Hyperlipidemia, unspecified: Secondary | ICD-10-CM

## 2024-01-29 DIAGNOSIS — K5790 Diverticulosis of intestine, part unspecified, without perforation or abscess without bleeding: Secondary | ICD-10-CM

## 2024-01-29 DIAGNOSIS — Z125 Encounter for screening for malignant neoplasm of prostate: Secondary | ICD-10-CM

## 2024-01-29 DIAGNOSIS — I480 Paroxysmal atrial fibrillation: Secondary | ICD-10-CM

## 2024-01-29 DIAGNOSIS — M109 Gout, unspecified: Secondary | ICD-10-CM

## 2024-01-29 DIAGNOSIS — I10 Essential (primary) hypertension: Secondary | ICD-10-CM | POA: Diagnosis not present

## 2024-01-29 DIAGNOSIS — E1169 Type 2 diabetes mellitus with other specified complication: Secondary | ICD-10-CM | POA: Diagnosis not present

## 2024-01-29 DIAGNOSIS — K219 Gastro-esophageal reflux disease without esophagitis: Secondary | ICD-10-CM

## 2024-01-29 DIAGNOSIS — E119 Type 2 diabetes mellitus without complications: Secondary | ICD-10-CM

## 2024-01-29 DIAGNOSIS — Z7985 Long-term (current) use of injectable non-insulin antidiabetic drugs: Secondary | ICD-10-CM

## 2024-01-29 DIAGNOSIS — I2510 Atherosclerotic heart disease of native coronary artery without angina pectoris: Secondary | ICD-10-CM | POA: Diagnosis not present

## 2024-01-29 LAB — LIPID PANEL

## 2024-01-29 LAB — BAYER DCA HB A1C WAIVED: HB A1C (BAYER DCA - WAIVED): 5.2 % (ref 4.8–5.6)

## 2024-01-29 MED ORDER — AMLODIPINE BESY-BENAZEPRIL HCL 5-40 MG PO CAPS: 2.0000 | ORAL_CAPSULE | Freq: Every day | ORAL | 1 refills | Status: DC

## 2024-01-29 MED ORDER — OMEPRAZOLE 20 MG PO CPDR: DELAYED_RELEASE_CAPSULE | ORAL | 1 refills | Status: DC

## 2024-01-29 MED ORDER — ALLOPURINOL 300 MG PO TABS: 300.0000 mg | ORAL_TABLET | Freq: Every day | ORAL | 1 refills | Status: DC

## 2024-01-29 MED ORDER — OZEMPIC (2 MG/DOSE) 8 MG/3ML ~~LOC~~ SOPN: 2.0000 mg | PEN_INJECTOR | SUBCUTANEOUS | 1 refills | Status: DC

## 2024-01-29 MED ORDER — ATORVASTATIN CALCIUM 40 MG PO TABS: 40.0000 mg | ORAL_TABLET | Freq: Every day | ORAL | 1 refills | Status: DC

## 2024-01-29 NOTE — Progress Notes (Signed)
 Subjective:    Patient ID: Donald Butler, male    DOB: [birth year], 63 y.o.   MRN: [id number]   Chief Complaint: annual physical     HPI:  Donald Butler is a 63 y.o. who identifies as a male who was assigned male at birth.   Social history: Lives with: wife Work history: retired   Water engineer in today for follow up of the following chronic medical issues:  1. Primary hypertension No c/o chest pain, sob or headache. Does not check blood pressure at home. BP Readings from Last 3 Encounters:  11/11/23 130/78  08/31/23 134/80  07/31/23 121/79     2. Coronary artery disease involving native coronary artery of native heart without angina pectoris 3. Paroxysmal atrial fibrillation [facility]) Patient saw cardiology on 11/11/23. They plan to continue risk reduction. No changes were made. Patient went into afib for about 3 hours Sunday night. It caused vomiting. Then he had to stay in the bed for 3 days. Just feels weak today.  4. Diverticulosis No recent flare up  5. Gastroesophageal reflux disease, unspecified whether esophagitis present Takes omeprazole  daily  6. Hyperlipidemia associated with type 2 diabetes mellitus (HCC) Does not watch diet and does no dedicated exercise. Lab Results  Component Value Date   CHOL 151 07/31/2023   HDL 75 07/31/2023   LDLCALC 61 07/31/2023   TRIG 78 07/31/2023   CHOLHDL 2.0 07/31/2023     7. Diet-controlled diabetes mellitus (HCC) Doe snot check blood sugars at home. Lab Results  Component Value Date   HGBA1C 5.0 07/31/2023     8. Gout, unspecified cause, unspecified chronicity, unspecified site Denies any recent flar eups  9. Morbid obesity (HCC) Weight is up 3 lbs  Wt Readings from Last 3 Encounters:  01/29/24 207 lb (93.9 kg)  11/11/23 204 lb (92.5 kg)  09/15/23 196 lb (88.9 kg)   BMI Readings from Last 3 Encounters:  01/29/24 31.47 kg/m  11/11/23 31.02 kg/m  09/15/23 29.80 kg/m       New complaints: None  today  Allergies  Allergen Reactions   Penicillins Hives, Shortness Of Breath and Swelling   Outpatient Encounter Medications as of 01/29/2024  Medication Sig   Accu-Chek Softclix Lancets lancets Check BS morning , noon and bedtime Dx E11.9   albuterol  (VENTOLIN  HFA) 108 (90 Base) MCG/ACT inhaler Inhale 2 puffs into the lungs every 6 (six) hours as needed for wheezing or shortness of breath.   allopurinol  (ZYLOPRIM ) 300 MG tablet Take 1 tablet (300 mg total) by mouth daily.   amLODipine -benazepril  (LOTREL) 5-40 MG capsule Take 2 capsules by mouth daily.   aspirin EC 81 MG tablet Take 81 mg by mouth daily.    atorvastatin  (LIPITOR) 40 MG tablet Take 1 tablet (40 mg total) by mouth daily.   Blood Glucose Monitoring Suppl (ACCU-CHEK GUIDE ME) w/Device KIT Check BS morning , noon and bedtime Dx E11.9   Blood Glucose Monitoring Suppl DEVI 1 each by Does not apply route in the morning, at noon, and at bedtime. May substitute to any manufacturer covered by patient's insurance.   colchicine  0.6 MG tablet TAKE 1 TABLET BY MOUTH ONCE DAILY AS NEEDED   glucose blood (ACCU-CHEK GUIDE) test strip Check BS morning , noon and bedtime Dx E11.9   HYDROcodone -acetaminophen  (NORCO) 10-325 MG tablet Take 1 tablet by mouth every 8 (eight) hours as needed. (Patient not taking: Reported on 11/11/2023)   Multiple Vitamin (MULTIVITAMIN) tablet Take 1 tablet by mouth daily.  omeprazole  (PRILOSEC ) 20 MG capsule TAKE 1 CAPSULE BY MOUTH TWICE DAILY ON AN EMPTY STOMACH, THEN WAIT ONE HOUR TO EAT   Semaglutide , 2 MG/DOSE, (OZEMPIC , 2 MG/DOSE,) 8 MG/3ML SOPN Inject 2 mg into the skin once a week.   No facility-administered encounter medications on file as of 01/29/2024.    Past Surgical History:  Procedure Laterality Date   BACK SURGERY  1982, 1983   ruptured disk  x 2   CARPAL TUNNEL RELEASE Left    COLONOSCOPY     EXTERNAL EAR SURGERY     KNEE ARTHROSCOPY  2006, 2008   x 2  left   MASTOIDECTOMY  2013   right    OPEN ANTERIOR SHOULDER RECONSTRUCTION  1970   right   POLYPECTOMY     reconstruction left leg  2009   multiple surgery after accident    Family History  Problem Relation Age of Onset   Diabetes Father    Colon cancer Neg Hx    Stomach cancer Neg Hx    Esophageal cancer Neg Hx    Rectal cancer Neg Hx    Pancreatic cancer Neg Hx       Controlled substance contract: n/a     Review of Systems  Constitutional:  Negative for diaphoresis.  Eyes:  Negative for pain.  Respiratory:  Negative for shortness of breath.   Cardiovascular:  Negative for chest pain, palpitations and leg swelling.  Gastrointestinal:  Negative for abdominal pain.  Endocrine: Negative for polydipsia.  Skin:  Negative for rash.  Neurological:  Negative for dizziness, weakness and headaches.  Hematological:  Does not bruise/bleed easily.  All other systems reviewed and are negative.      Objective:   Physical Exam Vitals and nursing note reviewed.  Constitutional:      Appearance: Normal appearance. He is well-developed.  HENT:     Head: Normocephalic.     Nose: Nose normal.     Mouth/Throat:     Mouth: Mucous membranes are moist.     Pharynx: Oropharynx is clear.   Eyes:     Pupils: Pupils are equal, round, and reactive to light.   Neck:     Thyroid : No thyroid  mass or thyromegaly.     Vascular: No carotid bruit or JVD.     Trachea: Phonation normal.   Cardiovascular:     Rate and Rhythm: Normal rate and regular rhythm.  Pulmonary:     Effort: Pulmonary effort is normal. No respiratory distress.     Breath sounds: Normal breath sounds.  Abdominal:     General: Bowel sounds are normal.     Palpations: Abdomen is soft.     Tenderness: There is no abdominal tenderness.   Musculoskeletal:        General: Normal range of motion.     Cervical back: Normal range of motion and neck supple.  Lymphadenopathy:     Cervical: No cervical adenopathy.   Skin:    General: Skin is warm and dry.    Neurological:     Mental Status: He is alert and oriented to person, place, and time.   Psychiatric:        Behavior: Behavior normal.        Thought Content: Thought content normal.        Judgment: Judgment normal.     BP 135/75   Pulse 70   Temp 98.1 F (36.7 C) (Temporal)   Ht 5' 8 (1.727 m)   Wt 207  lb (93.9 kg)   SpO2 94%   BMI 31.47 kg/m   EKG- junctional rhythm   Hgba1c discussed at appointment-5.2%      Assessment & Plan:   Donald Butler comes in today with chief complaint of annual physical  Diagnosis and orders addressed:  1. Primary hypertension (Primary) Low sodium diet - CBC with Differential/Platelet - CMP14+EGFR - amLODipine -benazepril  (LOTREL) 5-40 MG capsule; Take 2 capsules by mouth daily.  Dispense: 180 capsule; Refill: 1  2. Coronary artery disease involving native coronary artery of native heart without angina pectoris Keep follow up with cardiology Keep diary of any new atrial fib episodes  3. Paroxysmal atrial fibrillation (HCC) Avoid caffeine  4. Diverticulosis Watch diet to prevent flare up  5. Gastroesophageal reflux disease, unspecified whether esophagitis present Avoid spicy foods Do not eat 2 hours prior to bedtime - omeprazole  (PRILOSEC ) 20 MG capsule; TAKE 1 CAPSULE BY MOUTH TWICE DAILY ON AN EMPTY STOMACH, THEN WAIT ONE HOUR TO EAT  Dispense: 180 capsule; Refill: 1  6. Hyperlipidemia associated with type 2 diabetes mellitus (HCC) Low fat diet - Lipid panel - atorvastatin  (LIPITOR) 40 MG tablet; Take 1 tablet (40 mg total) by mouth daily.  Dispense: 90 tablet; Refill: 3  7. Gout, unspecified cause, unspecified chronicity, unspecified site - allopurinol  (ZYLOPRIM ) 300 MG tablet; Take 1 tablet (300 mg total) by mouth daily.  Dispense: 90 tablet; Refill: 1  8. Morbid obesity (HCC) Discussed diet and exercise for person with BMI >25 Will recheck weight in 3-6 months   9. Diabetes mellitus treated with injections of  non-insulin medication (HCC) Low carb diet - Bayer DCA Hb A1c Waived - Microalbumin / creatinine urine ratio - Semaglutide , 2 MG/DOSE, (OZEMPIC , 2 MG/DOSE,) 8 MG/3ML SOPN; Inject 2 mg into the skin once a week.  Dispense: 9 mL; Refill: 1   Labs pending Health Maintenance reviewed Diet and exercise encouraged  Follow up plan: 6 months   [name] Gaylyn Keas, FNP

## 2024-01-29 NOTE — Patient Instructions (Signed)
Atrial Fibrillation Atrial fibrillation (AFib) is a type of heartbeat that is irregular or fast. If you have AFib, your heart beats without any order. This makes it hard for your heart to pump blood in a normal way. AFib may come and go, or it may become a long-lasting problem. If AFib is not treated, it can put you at higher risk for stroke, heart failure, and other heart problems. What are the causes? AFib may be caused by diseases that damage the heart's electrical system. They include: High blood pressure. Heart failure. Heart valve diseases. Heart surgery. Diabetes. Thyroid disease. Kidney disease. Lung diseases, such as pneumonia or COPD. Sleep apnea. Sometimes the cause is not known. What increases the risk? You are more likely to develop AFib if: You are older. You exercise often and very hard. You have a family history of AFib. You are male. You are Caucasian. You are overweight. You smoke. You drink a lot of alcohol. What are the signs or symptoms? Common symptoms of this condition include: A feeling that your heart is beating very fast. Chest pain or discomfort. Feeling short of breath. Suddenly feeling light-headed or weak. Getting tired easily during activity. Fainting. Sweating. In some cases, there are no symptoms. How is this treated? Medicines to: Prevent blood clots. Treat heart rate or heart rhythm problems. Using devices, such as a pacemaker, to correct heart rhythm problems. Doing surgery to remove the part of the heart that sends bad signals. Closing an area where clots can form in the heart (left atrial appendage). In some cases, your doctor will treat other underlying conditions. Follow these instructions at home: Medicines Take over-the-counter and prescription medicines only as told by your doctor. Do not take any new medicines without first talking to your doctor. If you are taking blood thinners: Talk with your doctor before taking aspirin  or NSAIDs, such as ibuprofen. Take your medicines as told. Take them at the same time each day. Do not do things that could hurt or bruise you. Be careful to avoid falls. Wear an alert bracelet or carry a card that says you take blood thinners. Lifestyle Do not smoke or use any products that contain nicotine or tobacco. If you need help quitting, ask your doctor. Eat heart-healthy foods. Talk with your doctor about the right eating plan for you. Exercise regularly as told by your doctor. Do not drink alcohol. Lose weight if you are overweight. General instructions If you have sleep apnea, treat it as told by your doctor. Do not use diet pills unless your doctor says they are safe for you. Diet pills may make heart problems worse. Keep all follow-up visits. Your doctor will check your heart rate and rhythm regularly. Contact a doctor if: You notice a change in the speed, rhythm, or strength of your heartbeat. You are taking a blood-thinning medicine and you get more bruising. You get tired more easily when you move or exercise. You have a sudden change in weight. Get help right away if:  You have pain in your chest. You have trouble breathing. You have side effects of blood thinners, such as blood in your vomit, poop (stool), or pee (urine), or bleeding that cannot stop. You have any signs of a stroke. "BE FAST" is an easy way to remember the main warning signs: B - Balance. Dizziness, sudden trouble walking, or loss of balance. E - Eyes. Trouble seeing or a change in how you see. F - Face. Sudden weakness or loss of  feeling in the face. The face or eyelid may droop on one side. A - Arms.Weakness or loss of feeling in an arm. This happens suddenly and usually on one side of the body. S - Speech. Sudden trouble speaking, slurred speech, or trouble understanding what people say. T - Time.Time to call emergency services. Write down what time symptoms started. You have other signs of a  stroke, such as: A sudden, very bad headache with no known cause. Feeling like you may vomit (nausea). Vomiting. A seizure. These symptoms may be an emergency. Get help right away. Call 911. Do not wait to see if the symptoms will go away. Do not drive yourself to the hospital. This information is not intended to replace advice given to you by your health care provider. Make sure you discuss any questions you have with your health care provider. Document Revised: 04/23/2022 Document Reviewed: 04/23/2022 Elsevier Patient Education  2024 [employer].

## 2024-01-30 LAB — CMP14+EGFR
ALT: 18 IU/L (ref 0–44)
AST: 23 IU/L (ref 0–40)
Albumin: 4.4 g/dL (ref 3.9–4.9)
Alkaline Phosphatase: 80 IU/L (ref 44–121)
BUN/Creatinine Ratio: 17 (ref 10–24)
BUN: 15 mg/dL (ref 8–27)
Bilirubin Total: 0.5 mg/dL (ref 0.0–1.2)
CO2: 20 mmol/L (ref 20–29)
Calcium: 9.7 mg/dL (ref 8.6–10.2)
Chloride: 102 mmol/L (ref 96–106)
Creatinine, Ser: 0.86 mg/dL (ref 0.76–1.27)
Globulin, Total: 2.5 g/dL (ref 1.5–4.5)
Glucose: 109 mg/dL — ABNORMAL HIGH (ref 70–99)
Potassium: 4.8 mmol/L (ref 3.5–5.2)
Sodium: 140 mmol/L (ref 134–144)
Total Protein: 6.9 g/dL (ref 6.0–8.5)
eGFR: 97 mL/min/{1.73_m2} (ref >59)

## 2024-01-30 LAB — PSA, TOTAL AND FREE
PSA, Free Pct: 18.8 %
PSA, Free: 0.15 ng/mL
Prostate Specific Ag, Serum: 0.8 ng/mL (ref 0.0–4.0)

## 2024-01-30 LAB — CBC WITH DIFFERENTIAL/PLATELET
Basophils Absolute: 0 10*3/uL (ref 0.0–0.2)
Basos: 1 %
EOS (ABSOLUTE): 0.5 10*3/uL — ABNORMAL HIGH (ref 0.0–0.4)
Eos: 7 %
Hematocrit: 46.8 % (ref 37.5–51.0)
Hemoglobin: 15.7 g/dL (ref 13.0–17.7)
Immature Grans (Abs): 0 10*3/uL (ref 0.0–0.1)
Immature Granulocytes: 0 %
Lymphocytes Absolute: 1.6 10*3/uL (ref 0.7–3.1)
Lymphs: 24 %
MCH: 32.2 pg (ref 26.6–33.0)
MCHC: 33.5 g/dL (ref 31.5–35.7)
MCV: 96 fL (ref 79–97)
Monocytes Absolute: 0.7 10*3/uL (ref 0.1–0.9)
Monocytes: 11 %
Neutrophils Absolute: 3.7 10*3/uL (ref 1.4–7.0)
Neutrophils: 57 %
Platelets: 239 10*3/uL (ref 150–450)
RBC: 4.87 x10E6/uL (ref 4.14–5.80)
RDW: 12.1 % (ref 11.6–15.4)
WBC: 6.6 10*3/uL (ref 3.4–10.8)

## 2024-01-30 LAB — LIPID PANEL
Cholesterol, Total: 222 mg/dL — ABNORMAL HIGH (ref 100–199)
HDL: 75 mg/dL (ref >39)
LDL CALC COMMENT:: 3 ratio (ref 0.0–5.0)
LDL Chol Calc (NIH): 137 mg/dL — ABNORMAL HIGH (ref 0–99)
Triglycerides: 55 mg/dL (ref 0–149)
VLDL Cholesterol Cal: 10 mg/dL (ref 5–40)

## 2024-02-02 NOTE — Progress Notes (Deleted)
   Cardiology Office Note    Date:  02/02/2024  ID:  Donald Butler, DOB 25-Oct-1960, MRN [id number] PCP:  Delfina Feller, FNP  Cardiologist:  Eilleen Grates, MD  Electrophysiologist:  None   Chief Complaint: ***  History of Present Illness: .    Donald Butler is a 64 y.o. male with visit-pertinent history of nonobstructive CAD, HTN, mild MR, dyslipidemia, PAF, obesity, DM with improved A1C, GERD, obesity on GLP1, gout seen for follow-up. Echo 2019 EF 60-65%, mild MR, cannot exclude PFO. Cor CT 2019 showed CAC 856/97%ile but mild stenosis in prox RCA, prox-mid LAD, prox Cx managed medically. ETT 2022 showed hypertensive response but otherwise normal. He has a history of PAF with very low burden in the past, with monitor 01/2022 showing predominantly NSR with only one episode of NSR. In the setting of low burden, the patient was previously not on anticoagulation per Dr. Atlas Lea notes. He recently saw primary care and reported an episode of breakthrough atrial fibrillation.  Was he taking statin? Benzapril max dose? On 2 tablets daily? How often taking colchicne Osa eval?  Repeat echo Needs TSH, Mg level  Paroxysmal atrial fibrillation Coronary artery disease with HLD Mild MR by prior echo  Labwork independently reviewed: 01/2024 PCP LDL 137, trig 75 cmET ok K 4.8, CBC OK  ROS: .    Please see the history of present illness. Otherwise, review of systems is positive for ***.  All other systems are reviewed and otherwise negative.  Studies Reviewed: Aaron Aas    EKG:  EKG is ordered today, personally reviewed, demonstrating ***  CV Studies: Cardiac studies reviewed are outlined and summarized above. Otherwise please see EMR for full report.   Current Reported Medications:.    No outpatient medications have been marked as taking for the 02/03/24 encounter (Appointment) with Tawona Filsinger N, PA-C.    Physical Exam:    VS:  There were no vitals taken for this visit.   Wt Readings  from Last 3 Encounters:  01/29/24 207 lb (93.9 kg)  11/11/23 204 lb (92.5 kg)  09/15/23 196 lb (88.9 kg)    GEN: Well nourished, well developed in no acute distress NECK: No JVD; No carotid bruits CARDIAC: ***RRR, no murmurs, rubs, gallops RESPIRATORY:  Clear to auscultation without rales, wheezing or rhonchi  ABDOMEN: Soft, non-tender, non-distended EXTREMITIES:  No edema; No acute deformity   Asessement and Plan:.     ***     Disposition: F/u with ***  Signed, Tiffini Blacksher N Vedansh Kerstetter, PA-C

## 2024-02-03 ENCOUNTER — Ambulatory Visit: Admitting: Physician Assistant

## 2024-02-05 ENCOUNTER — Encounter: Payer: Self-pay | Admitting: Nurse Practitioner

## 2024-02-05 ENCOUNTER — Ambulatory Visit: Attending: Nurse Practitioner | Admitting: Nurse Practitioner

## 2024-02-05 VITALS — BP 136/80 | HR 67 | Ht 68.0 in | Wt 207.6 lb

## 2024-02-05 DIAGNOSIS — I340 Nonrheumatic mitral (valve) insufficiency: Secondary | ICD-10-CM | POA: Diagnosis not present

## 2024-02-05 DIAGNOSIS — E785 Hyperlipidemia, unspecified: Secondary | ICD-10-CM | POA: Diagnosis not present

## 2024-02-05 DIAGNOSIS — I2510 Atherosclerotic heart disease of native coronary artery without angina pectoris: Secondary | ICD-10-CM

## 2024-02-05 DIAGNOSIS — I10 Essential (primary) hypertension: Secondary | ICD-10-CM

## 2024-02-05 DIAGNOSIS — E119 Type 2 diabetes mellitus without complications: Secondary | ICD-10-CM

## 2024-02-05 DIAGNOSIS — I480 Paroxysmal atrial fibrillation: Secondary | ICD-10-CM

## 2024-02-05 NOTE — Patient Instructions (Addendum)
 Medication Instructions:  Take Metoprolol  Tartrate 25mg  twice daily ONLY as needed for palpitation.  *If you need a refill on your cardiac medications before your next appointment, please call your pharmacy*   Lab Work: LABS WILL BE DRAWN TODAY............. MAGNESIUM , TSH If you have labs (blood work) drawn today and your tests are completely normal, you will receive your results only by: MyChart Message (if you have MyChart) OR A paper copy in the mail If you have any lab test that is abnormal or we need to change your treatment, we will call you to review the results.   Testing/Procedures: Your physician has requested that you have an echocardiogram AT [facility]. Echocardiography is a painless test that uses sound waves to create images of your heart. It provides your doctor with information about the size and shape of your heart and how well your heart's chambers and valves are working. This procedure takes approximately one hour. There are no restrictions for this procedure. Please do NOT wear cologne, perfume, aftershave, or lotions (deodorant is allowed). Please arrive 15 minutes prior to your appointment time.  Please note: We ask at that you not bring children with you during ultrasound (echo/ vascular) testing. Due to room size and safety concerns, children are not allowed in the ultrasound rooms during exams. Our front office staff cannot provide observation of children in our lobby area while testing is being conducted. An adult accompanying a patient to their appointment will only be allowed in the ultrasound room at the discretion of the ultrasound technician under special circumstances. We apologize for any inconvenience.    Follow-Up: At [facility], you and your health needs are our priority.  As part of our continuing mission to provide you with exceptional heart care, we have created designated Provider Care Teams.  These Care Teams include your primary  Cardiologist (physician) and Advanced Practice Providers (APPs -  Physician Assistants and Nurse Practitioners) who all work together to provide you with the care you need, when you need it.  We recommend signing up for the patient portal called MyChart.  Sign up information is provided on this After Visit Summary.  MyChart is used to connect with patients for Virtual Visits (Telemedicine).  Patients are able to view lab/test results, encounter notes, upcoming appointments, etc.  Non-urgent messages can be sent to your provider as well.   To learn more about what you can do with MyChart, go to [email or web address].    Your next appointment:   2-3 month(s)  Provider:   Lynwood Schilling, MD IN [place] or Damien Braver    Other Instructions Thank you for choosing [facility]!

## 2024-02-05 NOTE — Progress Notes (Unsigned)
 Office Visit    Patient Name: Donald Butler Date of Encounter: 02/05/2024  Primary Care Provider:  Delfina Feller, FNP Primary Cardiologist:  Eilleen Grates, MD  Chief Complaint    63 year old male with a history of  nonobstructive CAD, paroxysmal atrial fibrillation, mild mitral valve regurgitation, hypertension, hyperlipidemia, type 2 diabetes, obesity, GERD, and gout who presents for follow-up related to CAD and hypertension.   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   Past Surgical History:  Procedure Laterality Date   BACK SURGERY  1982, 1983   ruptured disk  x 2   CARPAL TUNNEL RELEASE Left    COLONOSCOPY     EXTERNAL EAR SURGERY     KNEE ARTHROSCOPY  2006, 2008   x 2  left   MASTOIDECTOMY  2013   right   OPEN ANTERIOR SHOULDER RECONSTRUCTION  1970   right   POLYPECTOMY     reconstruction left leg  2009   multiple surgery after accident    Allergies  Allergies  Allergen Reactions   Penicillins Hives, Shortness Of Breath and Swelling     Labs/Other Studies Reviewed    The following studies were reviewed today:  Cardiac Studies & Procedures   ______________________________________________________________________________________________   STRESS TESTS  EXERCISE TOLERANCE TEST (ETT) 11/14/2020  Narrative  No diagnostic ST segment changes indicate ischemia. Patient achieved a maximum workload of 7 METS, 95% MPHR. Hypertensive blood pressure response to exercise. No chest pain reported. There were no significant arrhythmias. Low risk Duke treadmill score of 5.  Blood pressure demonstrated a hypertensive response to exercise.    ECHOCARDIOGRAM  ECHOCARDIOGRAM COMPLETE 06/17/2018  Narrative *[facility]* [address] [place], [place] [address] [phone]  ------------------------------------------------------------------- Transthoracic Echocardiography  Patient:    Donald Butler MR #:       [id number] Study Date: 06/17/2018 Gender:     M Age:        57 Height:     172.7 cm Weight:     109.7 kg BSA:        2.34 m^2 Pt. Status: Room:  SONOGRAPHER  [facility] ATTENDING    Drake Gens, MD ORDERING     Drake Gens, MD REFERRING    Drake Gens, MD PERFORMING   [facility]  cc:  ------------------------------------------------------------------- LV EF: 60% -   65%  ------------------------------------------------------------------- Indications:     R06.09  (R07.9).  ------------------------------------------------------------------- History:   PMH:   Chest pain.  Dyspnea.  Risk factors:  Former tobacco use. Hypertension. Obese. Dyslipidemia.  ------------------------------------------------------------------- Study Conclusions  - Left ventricle: The cavity size was normal. Wall thickness was normal. Systolic function was normal. The estimated ejection fraction was in the range of 60% to 65%. Wall motion was normal; there were no regional wall motion abnormalities. Indeterminate diastolic function. - Aortic valve: Mildly calcified annulus. Trileaflet. - Mitral valve: There was mild regurgitation. - Left atrium: The atrium was at the upper limits of normal in size. Acoustic shadowing in the left atrium looks to be consistent with artifact. - Right atrium: Central venous pressure (est): 3 mm Hg. -  Atrial septum: A patent foramen ovale cannot be excluded. - Tricuspid valve: There was trivial regurgitation. - Pulmonary arteries: Systolic pressure could not be accurately estimated. - Pericardium, extracardiac: There was no pericardial  effusion.  ------------------------------------------------------------------- Study data:  No prior study was available for comparison.  Study status:  Routine.  Procedure:  The patient reported no pain pre or post test. Transthoracic echocardiography. Image quality was adequate.  Study completion:  There were no complications. Transthoracic echocardiography.  M-mode, complete 2D, spectral Doppler, and color Doppler.  Birthdate:  Patient birthdate: 21-Jun-1961.  Age:  Patient is 63 yr old.  Sex:  Gender: male. BMI: 36.8 kg/m^2.  Blood pressure:     160/96  Patient status: Outpatient.  Study date:  Study date: 06/17/2018. Study time: 11:32 AM.  -------------------------------------------------------------------  ------------------------------------------------------------------- Left ventricle:  The cavity size was normal. Wall thickness was normal. Systolic function was normal. The estimated ejection fraction was in the range of 60% to 65%. Wall motion was normal; there were no regional wall motion abnormalities. Indeterminate diastolic function.  ------------------------------------------------------------------- Aortic valve:   Mildly calcified annulus. Trileaflet.  Doppler: There was no significant regurgitation.  ------------------------------------------------------------------- Aorta:  Aortic root: The aortic root was normal in size.  ------------------------------------------------------------------- Mitral valve:   The valve appears to be grossly normal.    Doppler: There was mild regurgitation.    Peak gradient (D): 4 mm Hg.  ------------------------------------------------------------------- Left atrium:  The atrium was at the upper limits of normal in size. Acoustic shadowing in the left atrium looks to be consistent with artifact.  ------------------------------------------------------------------- Atrial septum:  A patent foramen ovale cannot be  excluded.  ------------------------------------------------------------------- Right ventricle:  The cavity size was normal. Systolic function was normal.  ------------------------------------------------------------------- Pulmonic valve:    The valve appears to be grossly normal. Doppler:  There was no significant regurgitation.  ------------------------------------------------------------------- Tricuspid valve:   The valve appears to be grossly normal. Doppler:  There was trivial regurgitation.  ------------------------------------------------------------------- Pulmonary artery:    Systolic pressure could not be accurately estimated.  ------------------------------------------------------------------- Right atrium:  The atrium was normal in size.  ------------------------------------------------------------------- Pericardium:  There was no pericardial effusion.  ------------------------------------------------------------------- Systemic veins: Inferior vena cava: The vessel was normal in size. The respirophasic diameter changes were in the normal range (>= 50%), consistent with normal central venous pressure.  ------------------------------------------------------------------- Measurements  Left ventricle                             Value        Reference LV ID, ED, PLAX chordal                    49.4  mm     43 - 52 LV ID, ES, PLAX chordal                    32.5  mm     23 - 38 LV fx shortening, PLAX chordal             34    %      >=29 LV PW thickness, ED                        10.9  mm     ---------- IVS/LV PW ratio, ED  0.62         <=1.3 Stroke volume, 2D                          76    ml     ---------- Stroke volume/bsa, 2D                      32    ml/m^2 ---------- LV ejection fraction, 1-p A4C              69    %      ---------- LV end-diastolic volume, 2-p               70    ml     ---------- LV end-systolic volume, 2-p                 25    ml     ---------- LV ejection fraction, 2-p                  64    %      ---------- Stroke volume, 2-p                         45    ml     ---------- LV end-diastolic volume/bsa, 2-p           30    ml/m^2 ---------- LV end-systolic volume/bsa, 2-p            11    ml/m^2 ---------- Stroke volume/bsa, 2-p                     19.4  ml/m^2 ---------- LV e&', lateral                             10.2  cm/s   ---------- LV E/e&', lateral                           9.42         ---------- LV e&', medial                              7.54  cm/s   ---------- LV E/e&', medial                            12.75        ---------- LV e&', average                             8.87  cm/s   ---------- LV E/e&', average                           10.83        ----------  Ventricular septum                         Value        Reference IVS thickness, ED                          6.77  mm     ----------  LVOT                                       Value        Reference LVOT ID, S                                 20    mm     ---------- LVOT area                                  3.14  cm^2   ---------- LVOT peak velocity, S                      128   cm/s   ---------- LVOT mean velocity, S                      87.4  cm/s   ---------- LVOT VTI, S                                24.3  cm     ---------- LVOT peak gradient, S                      7     mm Hg  ----------  Aorta                                      Value        Reference Aortic root ID, ED                         36    mm     ----------  Left atrium                                Value        Reference LA ID, A-P, ES                             40    mm     ---------- LA ID/bsa, A-P                             1.71  cm/m^2 <=2.2 LA volume, S                               60.7  ml     ---------- LA volume/bsa, S                           26    ml/m^2 ---------- LA volume, ES, 1-p A4C                     70.1  ml     ---------- LA  volume/bsa, ES, 1-p A4C  30    ml/m^2 ---------- LA volume, ES, 1-p A2C                     49.3  ml     ---------- LA volume/bsa, ES, 1-p A2C                 21.1  ml/m^2 ----------  Mitral valve                               Value        Reference Mitral E-wave peak velocity                96.1  cm/s   ---------- Mitral A-wave peak velocity                132   cm/s   ---------- Mitral deceleration time           (H)     251   ms     150 - 230 Mitral peak gradient, D                    4     mm Hg  ---------- Mitral E/A ratio, peak                     0.8          ---------- Mitral maximal regurg velocity,            125   cm/s   ---------- PISA  Tricuspid valve                            Value        Reference Tricuspid regurg peak velocity             89.3  cm/s   ---------- Tricuspid peak RV-RA gradient              3     mm Hg  ----------  Right atrium                               Value        Reference RA ID, S-I, ES, A4C                        46.3  mm     34 - 49 RA area, ES, A4C                           13.7  cm^2   8.3 - 19.5 RA volume, ES, A/L                         32.6  ml     ---------- RA volume/bsa, ES, A/L                     13.9  ml/m^2 ----------  Systemic veins                             Value        Reference Estimated CVP  3     mm Hg  ----------  Right ventricle                            Value        Reference TAPSE                                      30.2  mm     ---------- RV pressure, S, DP                         6     mm Hg  <=30 RV s&', lateral, S                          20.1  cm/s   ----------  Legend: (L)  and  (H)  mark values outside specified reference range.  ------------------------------------------------------------------- Prepared and Electronically Authenticated by  Teddie Favre, M.D. 2019-10-31T16:09:39    MONITORS  CARDIAC EVENT MONITOR 01/29/2022  Narrative One episode of  atrial fib brief.  Predominant rhythm was normal sinus.  No symptoms reported.   CT SCANS  CT CORONARY MORPH W/CTA COR W/SCORE 06/23/2018  Addendum 06/23/2018  5:53 PM ADDENDUM REPORT: 06/23/2018 17:51  CLINICAL DATA:  Chest pain  EXAM: Cardiac CTA  MEDICATIONS: Sub lingual nitro. 4mg  x 2  TECHNIQUE: The patient was scanned on a Siemens 192 slice scanner. Gantry rotation speed was 250 msecs. Collimation was 0.6 mm. A 100 kV prospective scan was triggered in the ascending thoracic aorta at 35-75% of the R-R interval. Average HR during the scan was 60 bpm. The 3D data set was interpreted on a dedicated work station using MPR, MIP and VRT modes. A total of 80cc of contrast was used.  FINDINGS: Non-cardiac: See separate report from [facility].  Pulmonary veins drain normally to the left atrium.  Calcium  Score: 856 Agatston units.  Coronary Arteries: Right dominant with no anomalies  LM: Mixed plaque in the left main with mild distal stenosis.  LAD system: Extensive mixed plaque in the proximal to mid LAD. Suspect mild (<50%) stenosis.  Circumflex system: Calcified plaque proximally, suspect mild (<50%) stenosis.  RCA system: Calcified plaque proximally, suspect mild (<50%) stenosis.  IMPRESSION: 1. Coronary artery calcium  score 856 Agatston units. This places the patient in the 97th percentile for age and gender, suggesting high risk for future cardiac events.  2. Suspect mild stenosis (<50%) in the proximal RCA, proximal-mid LAD, and proximal LCx. Will send for FFR to confirm.  Dalton Mclean   Electronically Signed By: Peder Bourdon M.D. On: 06/23/2018 17:51  Narrative EXAM: OVER-READ INTERPRETATION  CT CHEST  The following report is an over-read performed by radiologist Dr. Lore Rode of [facility] on 06/23/2018. This over-read does not include interpretation of cardiac or coronary anatomy or pathology. The coronary CTA  interpretation by the cardiologist is attached.  COMPARISON:  Chest radiograph 01/05/2015.  No prior CT.  FINDINGS: Vascular: Aortic atherosclerosis. No imaged pulmonary embolism on this nondedicated exam.  Mediastinum/Nodes: No imaged thoracic adenopathy.  Lungs/Pleura: No imaged pleural fluid. A vague 5 mm inferior right upper lobe nodule including on image 12/2.  Peribronchovascular airspace consolidation is mild including on image 32/12 in the right lower lobe.  Upper Abdomen: Hepatic steatosis. Normal imaged portions of  the spleen.  Musculoskeletal: No acute osseous abnormality.  IMPRESSION: 1. Subtle right lower lobe peribronchovascular pulmonary opacities are suspicious for infection. 2. Right upper lobe nodular density could also represent an area of infection but is indeterminate. No follow-up needed if patient is low-risk. Non-contrast chest CT can be considered in 12 months if patient is high-risk. This recommendation follows the consensus statement: Guidelines for Management of Incidental Pulmonary Nodules Detected on CT Images: From the Fleischner Society 2017; Radiology 2017; 284:228-243. 3.  Aortic Atherosclerosis (ICD10-I70.0). 4. Hepatic steatosis.  These results will be called to the ordering clinician or representative by the Radiologist Assistant, and communication documented in the PACS or zVision Dashboard.  Electronically Signed: By: Lore Rode M.D. On: 06/23/2018 17:37   CT CORONARY FRACTIONAL FLOW RESERVE DATA PREP 06/23/2018  Narrative CLINICAL DATA:  Chest pain  EXAM: CT FFR  MEDICATIONS: No additional medications.  TECHNIQUE: The coronary CT was sent for FFR.  FINDINGS: FFR 0.88 in mid RCA  No other area with significantly decreased FFR.  IMPRESSION: Nonobstructive disease in the coronaries.  Dalton Mclean   Electronically Signed By: Peder Bourdon M.D. On: 06/24/2018 13:06      ______________________________________________________________________________________________     Recent Labs: 01/29/2024: ALT 18; BUN 15; Creatinine, Ser 0.86; Hemoglobin 15.7; Platelets 239; Potassium 4.8; Sodium 140  Recent Lipid Panel    Component Value Date/Time   CHOL 222 (H) 01/29/2024 0809   TRIG 55 01/29/2024 0809   HDL 75 01/29/2024 0809   CHOLHDL 3.0 01/29/2024 0809   LDLCALC 137 (H) 01/29/2024 0809    History of Present Illness    63 year old male with a history of nonobstructive CAD, paroxysmal atrial fibrillation, mild mitral valve regurgitation, hypertension, hyperlipidemia, type 2 diabetes, obesity, GERD, and gout.    Echo 2019 EF 60-65%, mild MR, cannot exclude PFO. Cor CT in 2019 showed CAC 856 (97th percentile), mild stenosis in prox RCA, prox-mid LAD, prox Cx managed medically. ETT 2022 showed hypertensive response but otherwise normal. He has a history of PAF with very low burden in the past. Cardiac monitor in 01/2022 showed predominantly NSR with only one episode of atrial fibrillation. Anticoagulation was deferred in the setting of low a fib burden. He was last seen in the office on 11/11/2023 and was stable from a cardiac standpoint.  He recently saw primary care and reported an episode of breakthrough atrial fibrillation.    He presents today for follow-up.  Since his last visit  Was he taking statin? Benzapril max dose? On 2 tablets daily? How often taking colchicne Osa eval?  I had reached out to primary care about the benazepril  dose they were prescribing - not sure why they have him on 80mg  daily, as max recommended dose is 40mg  daily. She put the ball back in my court and I did update her that I unfortunately would not be meeting the patient after all. Just in case she had not made any adjustments by your OV tomorrow.  I saw you are prescribing amlodipine /benazepril  for 2 tablets a day equalling 10mg  of amlodipine  and 80mg  of benazepril  a day. The max  dose of benazepril  is usually 40mg  total per day; I saw this is chronic for him so wasn't sure if you had him on this dose specifically for a reason.   Feel free to change it. He came  to me on this I think. But the help wold be great!   Repeat echo Needs TSH, Mg level CHA2DS2-VASc Score = 1  Stable overall from a cardiac standpoint.  He had an episode of atrial fibrillation that lasted approximately 3 hours on 01/24/2024.  He had associated vomiting.  He is not sure if he was sick with a GI bug or if the atrial fibrillation led him to vomit.  Heart rate ranged between 120s to the 140s bpm.  For 3 days after he was extremely fatigued.,  He has noted some generalized fatigue since.  Not interested in sleep study as he would not wear a CPAP machine.  He denies any recurrent palpitations, denies chest pain, dyspnea, dizziness, presyncope, syncope, edema, PND, orthopnea, weight gain.  Will update echocardiogram.  Will check TSH, magnesium . Will add metoprolol  tartrate 25 mg bid as needed for palpitations, breakthrough atrial fibrillation.  LDL was 137 in 01/2024.  He was not taking his Lipitor at the time. Will plan to update fasting lipids, LFTs at next follow-up visit.  Reviewed ED precautions.  Pending symptoms and echo results, consider need for ischemic evaluation, will defer for now.  Follow-up in 2 to 3 months, sooner if needed.  Paroxysmal atrial fibrillation: Nonobstructive CAD:  Mitral valve regurgitation:  Hypertension: Hyperlipidemia: Type 2 diabetes/obesity: Disposition:   Home Medications    Current Outpatient Medications  Medication Sig Dispense Refill   Accu-Chek Softclix Lancets lancets Check BS morning , noon and bedtime Dx E11.9 300 each 3   albuterol  (VENTOLIN  HFA) 108 (90 Base) MCG/ACT inhaler Inhale 2 puffs into the lungs every 6 (six) hours as needed for wheezing or shortness of breath. 1 each 5   allopurinol  (ZYLOPRIM ) 300 MG tablet Take 1 tablet (300 mg total) by mouth  daily. 90 tablet 1   amLODipine -benazepril  (LOTREL) 5-40 MG capsule Take 2 capsules by mouth daily. 180 capsule 1   aspirin EC 81 MG tablet Take 81 mg by mouth daily.      atorvastatin  (LIPITOR) 40 MG tablet Take 1 tablet (40 mg total) by mouth daily. 90 tablet 1   Blood Glucose Monitoring Suppl (ACCU-CHEK GUIDE ME) w/Device KIT Check BS morning , noon and bedtime Dx E11.9 1 kit 0   Blood Glucose Monitoring Suppl DEVI 1 each by Does not apply route in the morning, at noon, and at bedtime. May substitute to any manufacturer covered by patient's insurance. 1 each 0   colchicine  0.6 MG tablet TAKE 1 TABLET BY MOUTH ONCE DAILY AS NEEDED 30 tablet 0   glucose blood (ACCU-CHEK GUIDE) test strip Check BS morning , noon and bedtime Dx E11.9 300 each 3   Multiple Vitamin (MULTIVITAMIN) tablet Take 1 tablet by mouth daily.      omeprazole  (PRILOSEC ) 20 MG capsule TAKE 1 CAPSULE BY MOUTH TWICE DAILY ON AN EMPTY STOMACH, THEN WAIT ONE HOUR TO EAT 180 capsule 1   Semaglutide , 2 MG/DOSE, (OZEMPIC , 2 MG/DOSE,) 8 MG/3ML SOPN Inject 2 mg into the skin once a week. 9 mL 1   No current facility-administered medications for this visit.     Review of Systems    ***.  All other systems reviewed and are otherwise negative except as noted above.    Physical Exam    VS:  BP 136/80 (BP Location: Right Arm, Patient Position: Sitting, Cuff Size: Normal)   Pulse 67   Ht 5' 8 (1.727 m)   Wt 207 lb 9.6 oz (94.2 kg)   SpO2 94%   BMI 31.57 kg/m  , BMI Body mass index is 31.57 kg/m.     GEN: Well nourished, well developed,  in no acute distress. HEENT: normal. Neck: Supple, no JVD, carotid bruits, or masses. Cardiac: RRR, no murmurs, rubs, or gallops. No clubbing, cyanosis, edema.  Radials/DP/PT 2+ and equal bilaterally.  Respiratory:  Respirations regular and unlabored, clear to auscultation bilaterally. GI: Soft, nontender, nondistended, BS + x 4. MS: no deformity or atrophy. Skin: warm and dry, no  rash. Neuro:  Strength and sensation are intact. Psych: Normal affect.  Accessory Clinical Findings    ECG personally reviewed by me today - EKG Interpretation Date/Time:  Friday February 05 2024 10:59:32 EDT Ventricular Rate:  67 PR Interval:  144 QRS Duration:  84 QT Interval:  404 QTC Calculation: 426 R Axis:   58  Text Interpretation: Normal sinus rhythm Normal ECG When compared with ECG of 11-Nov-2023 11:22, No significant change was found Confirmed by Marlana Silvan ([id number]) on 02/05/2024 11:04:02 AM  - no acute changes.   Lab Results  Component Value Date   WBC 6.6 01/29/2024   HGB 15.7 01/29/2024   HCT 46.8 01/29/2024   MCV 96 01/29/2024   PLT 239 01/29/2024   Lab Results  Component Value Date   CREATININE 0.86 01/29/2024   BUN 15 01/29/2024   NA 140 01/29/2024   K 4.8 01/29/2024   CL 102 01/29/2024   CO2 20 01/29/2024   Lab Results  Component Value Date   ALT 18 01/29/2024   AST 23 01/29/2024   ALKPHOS 80 01/29/2024   BILITOT 0.5 01/29/2024   Lab Results  Component Value Date   CHOL 222 (H) 01/29/2024   HDL 75 01/29/2024   LDLCALC 137 (H) 01/29/2024   TRIG 55 01/29/2024   CHOLHDL 3.0 01/29/2024    Lab Results  Component Value Date   HGBA1C 5.2 01/29/2024    Assessment & Plan    1.  ***      Jude Norton, NP 02/05/2024, 11:18 AM

## 2024-02-06 LAB — MAGNESIUM: Magnesium: 2.4 mg/dL — ABNORMAL HIGH (ref 1.6–2.3)

## 2024-02-06 LAB — TSH: TSH: 1.78 u[IU]/mL (ref 0.450–4.500)

## 2024-02-07 ENCOUNTER — Encounter: Payer: Self-pay | Admitting: Nurse Practitioner

## 2024-02-08 ENCOUNTER — Ambulatory Visit (HOSPITAL_BASED_OUTPATIENT_CLINIC_OR_DEPARTMENT_OTHER): Admitting: Nurse Practitioner

## 2024-02-09 ENCOUNTER — Telehealth: Payer: Self-pay | Admitting: Nurse Practitioner

## 2024-02-09 NOTE — Telephone Encounter (Signed)
 Pt states that Damien was staring pt on Metoprolol  but it has not been sent to pharmacy. Requesting cb

## 2024-02-10 ENCOUNTER — Ambulatory Visit: Admitting: Family Medicine

## 2024-02-10 ENCOUNTER — Encounter: Payer: Self-pay | Admitting: Family Medicine

## 2024-02-10 VITALS — BP 124/75 | HR 75 | Temp 97.9°F | Ht 68.0 in | Wt 207.6 lb

## 2024-02-10 DIAGNOSIS — T63481A Toxic effect of venom of other arthropod, accidental (unintentional), initial encounter: Secondary | ICD-10-CM | POA: Diagnosis not present

## 2024-02-10 MED ORDER — PREDNISONE 10 MG (21) PO TBPK: ORAL_TABLET | ORAL | 0 refills | Status: DC

## 2024-02-10 MED ORDER — METHYLPREDNISOLONE ACETATE 80 MG/ML IJ SUSP
80.0000 mg | Freq: Once | INTRAMUSCULAR | Status: AC
  Administered 2024-02-10: 80 mg via INTRAMUSCULAR

## 2024-02-10 NOTE — Addendum Note (Signed)
 Addended by: JOESPH ANNABELLA HERO on: 02/10/2024 08:46 AM   Modules accepted: Level of Service

## 2024-02-10 NOTE — Progress Notes (Signed)
   Acute Office Visit  Subjective:     Patient ID: Donald Butler, male    DOB: Nov 17, 1960, 63 y.o.   MRN: [id number]  Chief Complaint  Patient presents with   Insect Bite    HPI Patient is in today for an insect sting. This occurred yesterday about 5 pm. He saw a wing around is eyeglasses. He went to swipe it and felt a burning right under and above his right eye. The upper eye lid has been swollen since. He is able to see out of the eye but has to lift his swollen upper eyelid. He did take some benadryl and iced the area yesterday with minimal relief. He denies shortness of breath, wheezing, swelling of mouth, lips, tongue, or throat. Unsure what insect this was.   ROS As per HPI.      Objective:    BP 124/75   Pulse 75   Temp 97.9 F (36.6 C) (Temporal)   Ht 5' 8 (1.727 m)   Wt 207 lb 9.6 oz (94.2 kg)   SpO2 96%   BMI 31.57 kg/m    Physical Exam Vitals and nursing note reviewed.  Constitutional:      General: He is not in acute distress.    Appearance: He is not ill-appearing, toxic-appearing or diaphoretic.  HENT:     Nose: Nose normal.     Mouth/Throat:     Mouth: Mucous membranes are moist. No angioedema.     Pharynx: Oropharynx is clear. No oropharyngeal exudate or posterior oropharyngeal erythema.   Eyes:     General: Lids are everted, no foreign bodies appreciated. Vision grossly intact. Gaze aligned appropriately.        Right eye: No foreign body or discharge.        Left eye: No discharge.     Extraocular Movements:     Right eye: Normal extraocular motion and no nystagmus.     Left eye: Normal extraocular motion and no nystagmus.     Conjunctiva/sclera: Conjunctivae normal.     Comments: Moderate swelling of right upper eye lid. No erythema, tenderness. No exudate.   Pulmonary:     Effort: Pulmonary effort is normal. No respiratory distress.   Skin:    General: Skin is warm and dry.   Neurological:     General: No focal deficit present.      Mental Status: He is alert and oriented to person, place, and time.   Psychiatric:        Mood and Affect: Mood normal.        Behavior: Behavior normal.     No results found for any visits on 02/10/24.      Assessment & Plan:   Donald Butler was seen today for insect bite.  Diagnoses and all orders for this visit:  Insect stings, accidental or unintentional, initial encounter Steroid IM injection today in office. Start prednisone  burst tomorrow. Discussed ice, antihistamines.  -     methylPREDNISolone  acetate (DEPO-MEDROL ) injection 80 mg -     predniSONE  (STERAPRED UNI-PAK 21 TAB) 10 MG (21) TBPK tablet; Use as directed on back of pill pack    Return if symptoms worsen or fail to improve.  The patient indicates understanding of these issues and agrees with the plan.  Annabella CHRISTELLA Search, FNP

## 2024-02-11 ENCOUNTER — Ambulatory Visit: Payer: Self-pay | Admitting: Nurse Practitioner

## 2024-02-12 ENCOUNTER — Telehealth: Payer: Self-pay

## 2024-02-12 DIAGNOSIS — I480 Paroxysmal atrial fibrillation: Secondary | ICD-10-CM

## 2024-02-12 MED ORDER — METOPROLOL TARTRATE 25 MG PO TABS: 25.0000 mg | ORAL_TABLET | Freq: Two times a day (BID) | ORAL | 1 refills | Status: DC

## 2024-02-12 NOTE — Addendum Note (Signed)
 Addended by: Madisan Bice L on: 02/12/2024 09:45 AM   Modules accepted: Orders

## 2024-02-26 ENCOUNTER — Ambulatory Visit (HOSPITAL_COMMUNITY)
Admission: RE | Admit: 2024-02-26 | Discharge: 2024-02-26 | Disposition: A | Discharge status: Home / Self Care | Source: Ambulatory Visit | Attending: Nurse Practitioner | Admitting: Nurse Practitioner

## 2024-02-26 DIAGNOSIS — E785 Hyperlipidemia, unspecified: Secondary | ICD-10-CM | POA: Diagnosis not present

## 2024-02-26 DIAGNOSIS — I480 Paroxysmal atrial fibrillation: Secondary | ICD-10-CM

## 2024-02-26 DIAGNOSIS — I340 Nonrheumatic mitral (valve) insufficiency: Secondary | ICD-10-CM | POA: Diagnosis not present

## 2024-02-26 DIAGNOSIS — I10 Essential (primary) hypertension: Secondary | ICD-10-CM | POA: Diagnosis not present

## 2024-02-26 DIAGNOSIS — E119 Type 2 diabetes mellitus without complications: Secondary | ICD-10-CM | POA: Insufficient documentation

## 2024-02-26 DIAGNOSIS — I2510 Atherosclerotic heart disease of native coronary artery without angina pectoris: Secondary | ICD-10-CM

## 2024-02-26 LAB — ECHOCARDIOGRAM COMPLETE
Area-P 1/2: 3.31 cm2
MV M vel: 2.48 m/s
MV Peak grad: 24.6 mmHg
S' Lateral: 3.3 cm

## 2024-02-26 NOTE — Progress Notes (Signed)
 Echocardiogram 2D Echocardiogram has been performed.  Koleen KANDICE Popper, RDCS 02/26/2024, 9:01 AM

## 2024-02-29 ENCOUNTER — Other Ambulatory Visit: Payer: Self-pay

## 2024-02-29 ENCOUNTER — Telehealth: Payer: Self-pay | Admitting: Nurse Practitioner

## 2024-02-29 DIAGNOSIS — Z1211 Encounter for screening for malignant neoplasm of colon: Secondary | ICD-10-CM

## 2024-02-29 NOTE — Telephone Encounter (Signed)
 Called and spoke with patient and he states that he hasn't heard back from her referral. Upon review of chart he was sent a Mychart message about the appt but it was sent to the wrong office for follow up. New referral made with correct office information and resubmitted per patients request

## 2024-02-29 NOTE — Telephone Encounter (Signed)
 Patient is calling to check on his referral for colonoscopy. Hadn't heard anything and states he talked to [facility] the last time her called. Don't show a referral for colonoscopy. Please call patient.

## 2024-03-01 ENCOUNTER — Telehealth: Payer: Self-pay | Admitting: Nurse Practitioner

## 2024-03-01 NOTE — Telephone Encounter (Signed)
 Needs Diabetic Eye Exam

## 2024-03-11 ENCOUNTER — Telehealth: Payer: Self-pay | Admitting: Family Medicine

## 2024-03-11 NOTE — Telephone Encounter (Signed)
 Copied from CRM [id number]. Topic: Referral - Question >> Mar 11, 2024 10:50 AM Antwanette L wrote: Reason for CRM: The patient is requesting a callback from [name] nurse Helen) regarding his referral. Please contact the patient at [phone]

## 2024-03-11 NOTE — Telephone Encounter (Signed)
No action, closing encounter 

## 2024-03-11 NOTE — Telephone Encounter (Signed)
 Called and gave the information on referral. Patient states they have not contacted him, per chart looks like they sent mychart message patient states he does not use mychart so I gave him the information to call.

## 2024-03-20 NOTE — Progress Notes (Unsigned)
 Donald Butler [id number] 1961-04-17   Chief Complaint:  Referring Provider: Gladis Mustard, * Primary GI MD: Sampson (previous Dr. Aneita)  HPI: Donald Butler is a 63 y.o. male with past medical history of GERD, gout, HLD, HTN, internal hemorrhoids who presents today for a complaint of rectal bleeding.    Patient last seen in office 07/20/2019 by Dr. Aneita for complaint of constipation and hemorrhoids.  Also endorsed frequent nausea and bloating.  Gastric emptying study 05/2019 was normal and symptoms improved when he started taking MiraLAX on a daily basis.  Noted problems with hemorrhoids partially prolapsing with intermittent rectal bleeding.  Treatment options including hemorrhoidal banding or surgical referral were discussed, and patient opted for colorectal surgical referral.  He is due for surveillance colonoscopy November 2025 due to history of adenomatous colon polyps.  On ozempic , Prilosec    Previous GI Procedures/Imaging   EGD 04/15/2019 - Esophagogastric landmarks identified. Slightly irregular zline.  - 3 cm hiatal hernia.  - Benign gastric inlet patch in the upper third of the esophagus.  - Normal esophagus otherwise  - Multiple small benign appearing gastric polyps. Biopsied.  - Erythematous mucosa in the antrum without focal ulceration.  - Normal stomach otherwise - biopsies taken to rule out H pylori  - Normal duodenal bulb and second portion of the duodenum. Biopsied. Path: 1. Surgical [P], small bowel - BENIGN SMALL BOWEL MUCOSA. - NO VILLOUS BLUNTING OR INCREASE IN INTRAEPITHELIAL LYMPHOCYTES. - NO DYSPLASIA OR MALIGNANCY. 2. Surgical [P], gastric antrum and body - REACTIVE GASTROPATHY. Donald Butler IS NEGATIVE FOR HELICOBACTER PYLORI. - NO INTESTINAL METAPLASIA, DYSPLASIA, OR MALIGNANCY. 3. Surgical [P], gastric polyp - FUNDIC GLAND POLYP. - WARTHIN-STARRY IS NEGATIVE FOR HELICOBACTER PYLORI. - NO INTESTINAL METAPLASIA, DYSPLASIA, OR  MALIGNANCY.  Colonoscopy 06/22/2017 - Lipomatous IC valve.  - Three 6 to 7 mm polyps in the sigmoid colon and in the transverse colon, removed with a cold snare. Resected and retrieved.  - Moderate diverticulosis in the left colon. There was narrowing of the colon in association with the diverticular opening. There was evidence of diverticular spasm. There was no evidence of diverticular bleeding.  - Internal hemorrhoids.  - The examination was otherwise normal on direct and retroflexion views. - Recall 5 years Path:  1. Surgical [P], ileocecal valve - BENIGN COLORECTAL MUCOSA WITH UNDERLYING ADIPOSE TISSUE. - THERE IS NO EVIDENCE OF MALIGNANCY. - SEE COMMENT. 2. Surgical [P], transverse, sigmoid, polyp (3) - HYPERPLASTIC POLYP(S). - THERE IS NO EVIDENCE OF MALIGNANCY.  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    Past Surgical History:  Procedure Laterality Date   BACK SURGERY  1982, 1983   ruptured disk  x 2   CARPAL TUNNEL RELEASE Left    COLONOSCOPY     EXTERNAL EAR SURGERY     KNEE ARTHROSCOPY  2006, 2008   x 2  left   MASTOIDECTOMY  2013   right   OPEN ANTERIOR SHOULDER RECONSTRUCTION  1970   right   POLYPECTOMY     reconstruction left leg  2009   multiple surgery after accident    Current Outpatient Medications  Medication Sig Dispense  Refill   Accu-Chek Softclix Lancets lancets Check BS morning , noon and bedtime Dx E11.9 300 each 3   albuterol  (VENTOLIN  HFA) 108 (90 Base) MCG/ACT inhaler Inhale 2 puffs into the lungs every 6 (six) hours as needed for wheezing or shortness of breath. 1 each 5   allopurinol  (ZYLOPRIM ) 300 MG tablet Take 1 tablet (300 mg total) by mouth daily. 90 tablet 1    amLODipine -benazepril  (LOTREL) 5-40 MG capsule Take 2 capsules by mouth daily. 180 capsule 1   aspirin EC 81 MG tablet Take 81 mg by mouth daily.      atorvastatin  (LIPITOR) 40 MG tablet Take 1 tablet (40 mg total) by mouth daily. 90 tablet 1   Blood Glucose Monitoring Suppl (ACCU-CHEK GUIDE ME) w/Device KIT Check BS morning , noon and bedtime Dx E11.9 1 kit 0   Blood Glucose Monitoring Suppl DEVI 1 each by Does not apply route in the morning, at noon, and at bedtime. May substitute to any manufacturer covered by patient's insurance. 1 each 0   colchicine  0.6 MG tablet TAKE 1 TABLET BY MOUTH ONCE DAILY AS NEEDED 30 tablet 0   glucose blood (ACCU-CHEK GUIDE) test strip Check BS morning , noon and bedtime Dx E11.9 300 each 3   metoprolol  tartrate (LOPRESSOR ) 25 MG tablet Take 1 tablet (25 mg total) by mouth 2 (two) times daily. Take 1 tablet twice a day as needed for palpitations 180 tablet 1   Multiple Vitamin (MULTIVITAMIN) tablet Take 1 tablet by mouth daily.      omeprazole  (PRILOSEC ) 20 MG capsule TAKE 1 CAPSULE BY MOUTH TWICE DAILY ON AN EMPTY STOMACH, THEN WAIT ONE HOUR TO EAT 180 capsule 1   predniSONE  (STERAPRED UNI-PAK 21 TAB) 10 MG (21) TBPK tablet Use as directed on back of pill pack. Start tomorrow. 21 tablet 0   Semaglutide , 2 MG/DOSE, (OZEMPIC , 2 MG/DOSE,) 8 MG/3ML SOPN Inject 2 mg into the skin once a week. 9 mL 1   No current facility-administered medications for this visit.    Allergies as of 03/21/2024 - Review Complete 02/10/2024  Allergen Reaction Noted   Penicillins Hives, Shortness Of Breath, and Swelling 04/22/2012    Family History  Problem Relation Age of Onset   Diabetes Father    Colon cancer Neg Hx    Stomach cancer Neg Hx    Esophageal cancer Neg Hx    Rectal cancer Neg Hx    Pancreatic cancer Neg Hx     Social History   Tobacco Use   Smoking status: Former   Smokeless tobacco: Former    Types: Chew    Quit date: 06/22/2012  Vaping Use   Vaping  status: Never Used  Substance Use Topics   Alcohol use: Yes    Alcohol/week: 21.0 standard drinks of alcohol    Types: 21 Standard drinks or equivalent per week    Comment: occ   Drug use: No     Review of Systems:    Constitutional: No weight loss, fever, chills, weakness or fatigue Eyes: No change in vision Ears, Nose, Throat:  No change in hearing or congestion Skin: No rash or itching Cardiovascular: No chest pain, chest pressure or palpitations   Respiratory: No SOB or cough Gastrointestinal: See HPI and otherwise negative Genitourinary: No dysuria or change in urinary frequency Neurological: No headache, dizziness or syncope Musculoskeletal: No new muscle or joint pain Hematologic: No bleeding or bruising    Physical Exam:  Vital signs: There were no  vitals taken for this visit.  Constitutional: NAD, Well developed, Well nourished, alert and cooperative Head:  Normocephalic and atraumatic.  Eyes: No scleral icterus. Conjunctiva pink. Mouth: No oral lesions. Respiratory: Respirations even and unlabored. Lungs clear to auscultation bilaterally.  No wheezes, crackles, or rhonchi.  Cardiovascular:  Regular rate and rhythm. No murmurs. No peripheral edema. Gastrointestinal:  Soft, nondistended, nontender. No rebound or guarding. Normal bowel sounds. No appreciable masses or hepatomegaly. Rectal:  Not performed.  Neurologic:  Alert and oriented x4;  grossly normal neurologically.  Skin:   Dry and intact without significant lesions or rashes. Psychiatric: Oriented to person, place and time. Demonstrates good judgement and reason without abnormal affect or behaviors.   RELEVANT LABS AND IMAGING: CBC    Component Value Date/Time   WBC 6.6 01/29/2024 0809   RBC 4.87 01/29/2024 0809   HGB 15.7 01/29/2024 0809   HCT 46.8 01/29/2024 0809   PLT 239 01/29/2024 0809   MCV 96 01/29/2024 0809   MCH 32.2 01/29/2024 0809   MCHC 33.5 01/29/2024 0809   RDW 12.1 01/29/2024 0809    LYMPHSABS 1.6 01/29/2024 0809   EOSABS 0.5 (H) 01/29/2024 0809   BASOSABS 0.0 01/29/2024 0809    CMP     Component Value Date/Time   NA 140 01/29/2024 0809   K 4.8 01/29/2024 0809   CL 102 01/29/2024 0809   CO2 20 01/29/2024 0809   GLUCOSE 109 (H) 01/29/2024 0809   BUN 15 01/29/2024 0809   CREATININE 0.86 01/29/2024 0809   CALCIUM  9.7 01/29/2024 0809   PROT 6.9 01/29/2024 0809   ALBUMIN 4.4 01/29/2024 0809   AST 23 01/29/2024 0809   ALT 18 01/29/2024 0809   ALKPHOS 80 01/29/2024 0809   BILITOT 0.5 01/29/2024 0809   GFRNONAA 94 06/19/2020 1107   GFRAA 109 06/19/2020 1107   Echocardiogram 02/26/2024 1. Left ventricular ejection fraction, by estimation, is 55 to 60% . Left ventricular ejection fraction by 3D volume is 55 % . The left ventricle has normal function. Left ventricular endocardial border not optimally defined to evaluate regional wall motion. Left ventricular diastolic parameters are consistent with Grade I diastolic dysfunction ( impaired relaxation) . The average left ventricular global longitudinal strain is - 19. 4 % . The global longitudinal strain is normal.  2. Right ventricular systolic function is normal. The right ventricular size is normal. There is normal pulmonary artery systolic pressure.  3. The mitral valve is normal in structure. Trivial mitral valve regurgitation. No evidence of mitral stenosis.  4. The aortic valve was not well visualized. Aortic valve regurgitation is not visualized. No aortic stenosis is present.  5. Aortic dilatation noted. There is moderate dilatation of the ascending aorta, measuring 44 mm.  6. The inferior vena cava is normal in size with greater than 50% respiratory variability, suggesting right atrial pressure of 3 mmHg.  Assessment/Plan:       Camie Furbish, PA-C  Gastroenterology 03/20/2024, 7:34 PM  Patient Care Team: Gladis Mustard, FNP as PCP - General (Family Medicine) Lavona Agent, MD as PCP -  Cardiology (Cardiology)

## 2024-03-21 ENCOUNTER — Telehealth: Payer: Self-pay

## 2024-03-21 ENCOUNTER — Encounter: Payer: Self-pay | Admitting: Gastroenterology

## 2024-03-21 ENCOUNTER — Other Ambulatory Visit (INDEPENDENT_AMBULATORY_CARE_PROVIDER_SITE_OTHER)

## 2024-03-21 ENCOUNTER — Ambulatory Visit: Payer: Self-pay | Admitting: Gastroenterology

## 2024-03-21 ENCOUNTER — Ambulatory Visit: Admitting: Gastroenterology

## 2024-03-21 VITALS — BP 142/70 | HR 83 | Ht 66.5 in | Wt 209.4 lb

## 2024-03-21 DIAGNOSIS — Z8719 Personal history of other diseases of the digestive system: Secondary | ICD-10-CM | POA: Diagnosis not present

## 2024-03-21 DIAGNOSIS — Z860102 Personal history of hyperplastic colon polyps: Secondary | ICD-10-CM

## 2024-03-21 DIAGNOSIS — Z860100 Personal history of colon polyps, unspecified: Secondary | ICD-10-CM | POA: Diagnosis not present

## 2024-03-21 DIAGNOSIS — K648 Other hemorrhoids: Secondary | ICD-10-CM

## 2024-03-21 DIAGNOSIS — K625 Hemorrhage of anus and rectum: Secondary | ICD-10-CM

## 2024-03-21 DIAGNOSIS — Z860101 Personal history of adenomatous and serrated colon polyps: Secondary | ICD-10-CM

## 2024-03-21 LAB — CBC WITH DIFFERENTIAL/PLATELET
Basophils Absolute: 0.1 K/uL (ref 0.0–0.1)
Basophils Relative: 1 % (ref 0.0–3.0)
Eosinophils Absolute: 0.3 K/uL (ref 0.0–0.7)
Eosinophils Relative: 2.9 % (ref 0.0–5.0)
HCT: 44 % (ref 39.0–52.0)
Hemoglobin: 14.9 g/dL (ref 13.0–17.0)
Lymphocytes Relative: 16.7 % (ref 12.0–46.0)
Lymphs Abs: 1.5 K/uL (ref 0.7–4.0)
MCHC: 33.8 g/dL (ref 30.0–36.0)
MCV: 96.1 fl (ref 78.0–100.0)
Monocytes Absolute: 0.7 K/uL (ref 0.1–1.0)
Monocytes Relative: 8.1 % (ref 3.0–12.0)
Neutro Abs: 6.5 K/uL (ref 1.4–7.7)
Neutrophils Relative %: 71.3 % (ref 43.0–77.0)
Platelets: 203 K/uL (ref 150.0–400.0)
RBC: 4.58 Mil/uL (ref 4.22–5.81)
RDW: 14.1 % (ref 11.5–15.5)
WBC: 9.2 K/uL (ref 4.0–10.5)

## 2024-03-21 LAB — COMPREHENSIVE METABOLIC PANEL WITH GFR
ALT: 32 U/L (ref 0–53)
AST: 30 U/L (ref 0–37)
Albumin: 4.6 g/dL (ref 3.5–5.2)
Alkaline Phosphatase: 80 U/L (ref 39–117)
BUN: 21 mg/dL (ref 6–23)
CO2: 24 meq/L (ref 19–32)
Calcium: 9.3 mg/dL (ref 8.4–10.5)
Chloride: 104 meq/L (ref 96–112)
Creatinine, Ser: 0.83 mg/dL (ref 0.40–1.50)
GFR: 92.79 mL/min (ref >60.00)
Glucose, Bld: 96 mg/dL (ref 70–99)
Potassium: 3.9 meq/L (ref 3.5–5.1)
Sodium: 138 meq/L (ref 135–145)
Total Bilirubin: 0.7 mg/dL (ref 0.2–1.2)
Total Protein: 7.3 g/dL (ref 6.0–8.3)

## 2024-03-21 MED ORDER — NA SULFATE-K SULFATE-MG SULF 17.5-3.13-1.6 GM/177ML PO SOLN: 1.0000 | Freq: Once | ORAL | 0 refills | Status: AC

## 2024-03-21 MED ORDER — HYDROCORTISONE ACETATE 25 MG RE SUPP: 25.0000 mg | Freq: Every evening | RECTAL | 0 refills | Status: AC

## 2024-03-21 NOTE — Telephone Encounter (Signed)
 [facility] Pre-operative Risk Assessment     Request for surgical clearance:     Endoscopy Procedure  What type of surgery is being performed?     Colonoscopy  When is this surgery scheduled?     03/29/24  What type of clearance is required ?   CARDIAC  Are there any medications that need to be held prior to surgery and how long? none  Practice name and name of physician performing surgery?      [facility]  What is your office phone and fax number?      Phone- [phone]  Fax- [phone]  Anesthesia type (None, local, MAC, general) ?       MAC   Please route your response to Alan Fusi, CMA

## 2024-03-21 NOTE — Telephone Encounter (Signed)
 Covering for Damien Braver, NP as she is out of office. Called Mr. Portier to request update on how he is feeling since last visit. Had to leave VM requesting call back. MyChart message sent.  If no new symptoms such as chest pain or dyspnea, may proceed with colonoscopy. If new or progressive symptoms, would recommend OV for further evaluation.   Dannika Hilgeman S Finneas Mathe, NP

## 2024-03-21 NOTE — Patient Instructions (Signed)
 Your provider has requested that you go to the basement level for lab work before leaving today. Press B on the elevator. The lab is located at the first door on the left as you exit the elevator.  We have sent the following medications to your pharmacy for you to pick up at your convenience: Anusol  suppositories nighty x 7-10 days.  Please purchase the following medications over the counter and take as directed: Calmol 4 suppositories as needed. Samples given in the office.   You have been scheduled for a colonoscopy. Please follow written instructions given to you at your visit today.   If you use inhalers (even only as needed), please bring them with you on the day of your procedure.  DO NOT TAKE 7 DAYS PRIOR TO TEST- Trulicity (dulaglutide) Ozempic , Wegovy  (semaglutide ) Mounjaro (tirzepatide ) Bydureon Bcise (exanatide extended release)  DO NOT TAKE 1 DAY PRIOR TO YOUR TEST Rybelsus  (semaglutide ) Adlyxin (lixisenatide) Victoza (liraglutide) Byetta (exanatide) ___________________________________________________________________________  _______________________________________________________  If your blood pressure at your visit was 140/90 or greater, please contact your primary care physician to follow up on this.  _______________________________________________________  If you are age 66 or older, your body mass index should be between 23-30. Your Body mass index is 33.29 kg/m. If this is out of the aforementioned range listed, please consider follow up with your Primary Care Provider.  If you are age 64 or younger, your body mass index should be between 19-25. Your Body mass index is 33.29 kg/m. If this is out of the aformentioned range listed, please consider follow up with your Primary Care Provider.   ________________________________________________________  The [facility] providers would like to encourage you to use MYCHART to communicate with providers for non-urgent  requests or questions.  Due to long hold times on the telephone, sending your provider a message by [facility] may be a faster and more efficient way to get a response.  Please allow 48 business hours for a response.  Please remember that this is for non-urgent requests.  _______________________________________________________  [facility] is using a team-based approach to care.  Your team is made up of your doctor and two to three APPS. Our APPS (Nurse Practitioners and Physician Assistants) work with your physician to ensure care continuity for you. They are fully qualified to address your health concerns and develop a treatment plan. They communicate directly with your gastroenterologist to care for you. Seeing the Advanced Practice Practitioners on your physician's team can help you by facilitating care more promptly, often allowing for earlier appointments, access to diagnostic testing, procedures, and other specialty referrals.

## 2024-03-21 NOTE — Telephone Encounter (Signed)
 Donald Butler,  [name] is requesting preoperative cardiac evaluation for colonoscopy.  Procedure scheduled for 03/29/2024.  You recently saw him in clinic on 02/05/2024.  He was doing well at that time.  Would you be able to comment on cardiac risk for upcoming procedure?  Thank you for your help.  Josefa HERO. Tinika Bucknam NP-C     03/21/2024, 10:34 AM [facility] [address] Office [phone] Fax [phone]

## 2024-03-22 NOTE — Progress Notes (Signed)
 ____________________________________________________________  Attending physician addendum:  Thank you for sending this case to me. I have reviewed the entire note and agree with the plan.  Will evaluate hemorrhoids at the time of the upcoming colonoscopy. If he still opts for surgical evaluation rather than banding, we will refer him to different colorectal surgery practice.  Victory Brand, MD  ____________________________________________________________

## 2024-03-22 NOTE — Telephone Encounter (Signed)
   Name: Donald Butler  DOB: Oct 21, 1960  MRN: [id number]   Primary Cardiologist: Lynwood Schilling, MD  Chart reviewed as part of pre-operative protocol coverage. Patient was contacted 03/22/2024 in reference to pre-operative risk assessment for pending surgery as outlined below.  YVETTE ROARK was last seen on 02/05/24 by Damien Braver, NP.  Since that day, Trust K Peschke has done well without cardiac symptoms. He plans to get repeat magnesium  level as previously recommended for mildly elevated mag 2.4 at his PCP [facility] location.  Therefore, based on ACC/AHA guidelines, the patient would be at acceptable risk for the planned procedure without further cardiovascular testing.   The patient was advised that if he develops new symptoms prior to surgery to contact our office to arrange for a follow-up visit, and he verbalized understanding.  I will route this recommendation to the requesting party via Epic fax function. Please call with questions.  Reche GORMAN Finder, NP 03/22/2024, 11:58 AM

## 2024-03-29 ENCOUNTER — Encounter: Payer: Self-pay | Admitting: Gastroenterology

## 2024-03-29 ENCOUNTER — Ambulatory Visit (AMBULATORY_SURGERY_CENTER): Admitting: Gastroenterology

## 2024-03-29 VITALS — BP 123/80 | HR 77 | Temp 97.9°F | Resp 12 | Ht 66.0 in | Wt 209.0 lb

## 2024-03-29 DIAGNOSIS — K5730 Diverticulosis of large intestine without perforation or abscess without bleeding: Secondary | ICD-10-CM

## 2024-03-29 DIAGNOSIS — D120 Benign neoplasm of cecum: Secondary | ICD-10-CM

## 2024-03-29 DIAGNOSIS — Z860101 Personal history of adenomatous and serrated colon polyps: Secondary | ICD-10-CM

## 2024-03-29 DIAGNOSIS — K635 Polyp of colon: Secondary | ICD-10-CM | POA: Diagnosis not present

## 2024-03-29 DIAGNOSIS — Z1211 Encounter for screening for malignant neoplasm of colon: Secondary | ICD-10-CM | POA: Diagnosis not present

## 2024-03-29 DIAGNOSIS — K648 Other hemorrhoids: Secondary | ICD-10-CM

## 2024-03-29 DIAGNOSIS — Z860100 Personal history of colon polyps, unspecified: Secondary | ICD-10-CM

## 2024-03-29 MED ORDER — SODIUM CHLORIDE 0.9 % IV SOLN: 500.0000 mL | Freq: Once | INTRAVENOUS | Status: DC

## 2024-03-29 NOTE — Progress Notes (Signed)
   Established Patient Office Visit  Subjective   Patient ID: Donald Butler, male    DOB: 22-Feb-1961  Age: 63 y.o. MRN: [id number]  Chief Complaint  Patient presents with   Colonoscopy    Hx colon polyps    HPI    ROS    Objective:     BP (!) 143/84   Pulse 67   Temp 97.9 F (36.6 C)   Resp 14   Ht 5' 6 (1.676 m)   Wt 209 lb (94.8 kg)   SpO2 100%   BMI 33.73 kg/m    Physical Exam   No results found for any visits on 03/29/24.    The 10-year ASCVD risk score (Arnett DK, et al., 2019) is: 25.3%    Assessment & Plan:   Problem List Items Addressed This Visit   None Visit Diagnoses       Hx of colonic polyps    -  Primary   Relevant Medications   0.9 %  sodium chloride  infusion   Other Relevant Orders   Diet NPO   Hypoglycemia/Hyperglycemia protocol   Emergency Medications   ECG and pulse monitoring    Monitor NIBP   Notify physician   Monitor O2 SATs   NPO status   Vital signs   Monitor NIBP, ECG and PSA02   Notify physician   Discharge home with responsible adult.   Discharge instructions   Colonoscopy: verify informed consent   POCT CBG monitoring   POCT CBG monitoring       No follow-ups on file.    Ninette Bernice Morrison, RN

## 2024-03-29 NOTE — Progress Notes (Signed)
 Sedate, gd SR, tolerated procedure well, VSS, report to RN

## 2024-03-29 NOTE — Patient Instructions (Signed)
 Please read handouts provided. Continue present medications. Await pathology results. Resume previous diet. As needed use of Preparation H suppository. High Fiber Diet.   YOU HAD AN ENDOSCOPIC PROCEDURE TODAY AT THE [facility]:   Refer to the procedure report that was given to you for any specific questions about what was found during the examination.  If the procedure report does not answer your questions, please call your gastroenterologist to clarify.  If you requested that your care partner not be given the details of your procedure findings, then the procedure report has been included in a sealed envelope for you to review at your convenience later.  YOU SHOULD EXPECT: Some feelings of bloating in the abdomen. Passage of more gas than usual.  Walking can help get rid of the air that was put into your GI tract during the procedure and reduce the bloating. If you had a lower endoscopy (such as a colonoscopy or flexible sigmoidoscopy) you may notice spotting of blood in your stool or on the toilet paper. If you underwent a bowel prep for your procedure, you may not have a normal bowel movement for a few days.  Please Note:  You might notice some irritation and congestion in your nose or some drainage.  This is from the oxygen used during your procedure.  There is no need for concern and it should clear up in a day or so.  SYMPTOMS TO REPORT IMMEDIATELY:  Following lower endoscopy (colonoscopy or flexible sigmoidoscopy):  Excessive amounts of blood in the stool  Significant tenderness or worsening of abdominal pains  Swelling of the abdomen that is new, acute  Fever of 100F or higher.  For urgent or emergent issues, a gastroenterologist can be reached at any hour by calling [phone]. Do not use MyChart messaging for urgent concerns.    DIET:  We do recommend a small meal at first, but then you may proceed to your regular diet.  Drink plenty of fluids but you should  avoid alcoholic beverages for 24 hours.  ACTIVITY:  You should plan to take it easy for the rest of today and you should NOT DRIVE or use heavy machinery until tomorrow (because of the sedation medicines used during the test).    FOLLOW UP: Our staff will call the number listed on your records the next business day following your procedure.  We will call around 7:15- 8:00 am to check on you and address any questions or concerns that you may have regarding the information given to you following your procedure. If we do not reach you, we will leave a message.     If any biopsies were taken you will be contacted by phone or by letter within the next 1-3 weeks.  Please call us  at [phone] if you have not heard about the biopsies in 3 weeks.    SIGNATURES/CONFIDENTIALITY: You and/or your care partner have signed paperwork which will be entered into your electronic medical record.  These signatures attest to the fact that that the information above on your After Visit Summary has been reviewed and is understood.  Full responsibility of the confidentiality of this discharge information lies with you and/or your care-partner.

## 2024-03-29 NOTE — Progress Notes (Signed)
 Pt's states no medical or surgical changes since previsit or office visit.

## 2024-03-29 NOTE — Progress Notes (Signed)
 Called to room to assist during endoscopic procedure.  Patient ID and intended procedure confirmed with present staff. Received instructions for my participation in the procedure from the performing physician.

## 2024-03-29 NOTE — Progress Notes (Signed)
 No significant changes to clinical history since GI office visit on 03/21/24.  The patient is appropriate for an endoscopic procedure in the ambulatory setting.  - Victory Brand, MD

## 2024-03-29 NOTE — Op Note (Signed)
 [facility] Patient Name: Donald Butler Procedure Date: 03/29/2024 2:04 PM MRN: [id number] Endoscopist: Victory L. Legrand , MD, [id number] Age: 63 Referring MD:  Date of Birth: May 20, 1961 Gender: Male Account #: [id number] Procedure:                Colonoscopy Indications:              Personal history of colonic polyps                           Adenomatous polyp 2013; no TA or SSP November 2018                            (Dr Aneita)                           Patient incidentally reports recent increase in                            rectal bleeding that is believed to be due to                            previously-discovered hemorrhoids Medicines:                Monitored Anesthesia Care Procedure:                Pre-Anesthesia Assessment:                           - Prior to the procedure, a History and Physical                            was performed, and patient medications and                            allergies were reviewed. The patient's tolerance of                            previous anesthesia was also reviewed. The risks                            and benefits of the procedure and the sedation                            options and risks were discussed with the patient.                            All questions were answered, and informed consent                            was obtained. Prior Anticoagulants: The patient has                            taken no anticoagulant or antiplatelet agents. ASA  Grade Assessment: III - A patient with severe                            systemic disease. After reviewing the risks and                            benefits, the patient was deemed in satisfactory                            condition to undergo the procedure.                           After obtaining informed consent, the colonoscope                            was passed under direct vision. Throughout the                            procedure,  the patient's blood pressure, pulse, and                            oxygen saturations were monitored continuously. The                            Olympus Scope SN: [id number] was introduced through                            the anus and advanced to the the cecum, identified                            by appendiceal orifice and ileocecal valve. The                            colonoscopy was performed without difficulty. The                            patient tolerated the procedure well. The quality                            of the bowel preparation was good. The ileocecal                            valve, appendiceal orifice, and rectum were                            photographed. Scope In: 2:14:26 PM Scope Out: 2:25:50 PM Scope Withdrawal Time: 0 hours 8 minutes 5 seconds  Total Procedure Duration: 0 hours 11 minutes 24 seconds  Findings:                 The perianal and digital rectal examinations were                            normal.  Repeat examination of right colon under NBI                            performed.                           Multiple diverticula were found in the left colon                            and right colon.                           Two sessile polyps were found in the cecum. The                            polyps were diminutive in size. These polyps were                            removed with a cold snare. Resection and retrieval                            were complete.                           Internal hemorrhoids were found. The hemorrhoids                            were medium-sized.                           The exam was otherwise without abnormality on                            direct and retroflexion views. Complications:            No immediate complications. Estimated Blood Loss:     Estimated blood loss was minimal. Impression:               - Diverticulosis in the left colon and in the right                             colon.                           - Two diminutive polyps in the cecum, removed with                            a cold snare. Resected and retrieved.                           - Internal hemorrhoids.                           - The examination was otherwise normal on direct                            and retroflexion views.  Recommendation:           - Patient has a contact number available for                            emergencies. The signs and symptoms of potential                            delayed complications were discussed with the                            patient. Return to normal activities tomorrow.                            Written discharge instructions were provided to the                            patient.                           - Resume previous diet.                           - Continue present medications.                           - Await pathology results.                           - Repeat colonoscopy is recommended for                            surveillance. The colonoscopy date will be                            determined after pathology results from today's                            exam become available for review.                           - As needed use of Preparation H suppository                           High-fiber diet                           Return to my office if you would like to proceed                            with hemorrhoidal banding Victory L. Legrand, MD 03/29/2024 2:33:24 PM This report has been signed electronically.

## 2024-03-30 ENCOUNTER — Telehealth: Payer: Self-pay

## 2024-03-30 NOTE — Telephone Encounter (Signed)
  Follow up Call-     03/29/2024    1:01 PM  Call back number  Post procedure Call Back phone  # [phone]  Permission to leave phone message Yes     Patient questions:  Do you have a fever, pain , or abdominal swelling? No. Pain Score  0 *  Have you tolerated food without any problems? Yes.    Have you been able to return to your normal activities? Yes.    Do you have any questions about your discharge instructions: Diet   No. Medications  No. Follow up visit  No.  Do you have questions or concerns about your Care? No.  Actions: * If pain score is 4 or above: No action needed, pain <4.

## 2024-03-31 ENCOUNTER — Emergency Department (HOSPITAL_COMMUNITY)
Admission: EM | Admit: 2024-03-31 | Discharge: 2024-03-31 | Disposition: A | Discharge status: Home / Self Care | Attending: Emergency Medicine | Admitting: Emergency Medicine

## 2024-03-31 ENCOUNTER — Encounter (HOSPITAL_COMMUNITY): Payer: Self-pay | Admitting: Emergency Medicine

## 2024-03-31 ENCOUNTER — Emergency Department (HOSPITAL_COMMUNITY)

## 2024-03-31 ENCOUNTER — Other Ambulatory Visit: Payer: Self-pay

## 2024-03-31 DIAGNOSIS — Z7982 Long term (current) use of aspirin: Secondary | ICD-10-CM | POA: Diagnosis not present

## 2024-03-31 DIAGNOSIS — I517 Cardiomegaly: Secondary | ICD-10-CM | POA: Diagnosis not present

## 2024-03-31 DIAGNOSIS — J189 Pneumonia, unspecified organism: Secondary | ICD-10-CM | POA: Diagnosis not present

## 2024-03-31 DIAGNOSIS — I700 Atherosclerosis of aorta: Secondary | ICD-10-CM | POA: Diagnosis not present

## 2024-03-31 DIAGNOSIS — J984 Other disorders of lung: Secondary | ICD-10-CM | POA: Diagnosis not present

## 2024-03-31 DIAGNOSIS — I480 Paroxysmal atrial fibrillation: Secondary | ICD-10-CM | POA: Insufficient documentation

## 2024-03-31 DIAGNOSIS — I2510 Atherosclerotic heart disease of native coronary artery without angina pectoris: Secondary | ICD-10-CM | POA: Diagnosis not present

## 2024-03-31 DIAGNOSIS — R079 Chest pain, unspecified: Secondary | ICD-10-CM | POA: Diagnosis not present

## 2024-03-31 DIAGNOSIS — R002 Palpitations: Secondary | ICD-10-CM | POA: Diagnosis present

## 2024-03-31 LAB — HEPATIC FUNCTION PANEL
ALT: 30 U/L (ref 0–44)
AST: 29 U/L (ref 15–41)
Albumin: 4 g/dL (ref 3.5–5.0)
Alkaline Phosphatase: 76 U/L (ref 38–126)
Bilirubin, Direct: 0.1 mg/dL (ref 0.0–0.2)
Indirect Bilirubin: 0.7 mg/dL (ref 0.3–0.9)
Total Bilirubin: 0.8 mg/dL (ref 0.0–1.2)
Total Protein: 7.2 g/dL (ref 6.5–8.1)

## 2024-03-31 LAB — TROPONIN I (HIGH SENSITIVITY)
Troponin I (High Sensitivity): 6 ng/L (ref <18)
Troponin I (High Sensitivity): 6 ng/L (ref <18)

## 2024-03-31 LAB — CBC
HCT: 43.5 % (ref 39.0–52.0)
Hemoglobin: 15.4 g/dL (ref 13.0–17.0)
MCH: 33.5 pg (ref 26.0–34.0)
MCHC: 35.4 g/dL (ref 30.0–36.0)
MCV: 94.6 fL (ref 80.0–100.0)
Platelets: 225 K/uL (ref 150–400)
RBC: 4.6 MIL/uL (ref 4.22–5.81)
RDW: 12.9 % (ref 11.5–15.5)
WBC: 6.8 K/uL (ref 4.0–10.5)
nRBC: 0 % (ref 0.0–0.2)

## 2024-03-31 LAB — BASIC METABOLIC PANEL WITH GFR
Anion gap: 11 (ref 5–15)
BUN: 18 mg/dL (ref 8–23)
CO2: 21 mmol/L — ABNORMAL LOW (ref 22–32)
Calcium: 9.2 mg/dL (ref 8.9–10.3)
Chloride: 106 mmol/L (ref 98–111)
Creatinine, Ser: 0.81 mg/dL (ref 0.61–1.24)
GFR, Estimated: >60 mL/min (ref >60)
Glucose, Bld: 111 mg/dL — ABNORMAL HIGH (ref 70–99)
Potassium: 4 mmol/L (ref 3.5–5.1)
Sodium: 138 mmol/L (ref 135–145)

## 2024-03-31 LAB — CBG MONITORING, ED: Glucose-Capillary: 113 mg/dL — ABNORMAL HIGH (ref 70–99)

## 2024-03-31 LAB — D-DIMER, QUANTITATIVE: D-Dimer, Quant: 1.11 ug{FEU}/mL — ABNORMAL HIGH (ref 0.00–0.50)

## 2024-03-31 MED ORDER — IOHEXOL 350 MG/ML SOLN
80.0000 mL | Freq: Once | INTRAVENOUS | Status: AC | PRN
  Administered 2024-03-31: 75 mL via INTRAVENOUS

## 2024-03-31 MED ORDER — METOPROLOL TARTRATE 5 MG/5ML IV SOLN
5.0000 mg | Freq: Once | INTRAVENOUS | Status: AC
  Administered 2024-03-31: 5 mg via INTRAVENOUS
  Filled 2024-03-31: qty 5

## 2024-03-31 MED ORDER — DOXYCYCLINE HYCLATE 100 MG PO CAPS: 100.0000 mg | ORAL_CAPSULE | Freq: Two times a day (BID) | ORAL | 0 refills | Status: DC

## 2024-03-31 NOTE — ED Notes (Signed)
Pt ambulated to the bathroom with stand by assistance.

## 2024-03-31 NOTE — ED Triage Notes (Signed)
 Pt states he has been in afib x5 hours per his I watch ECG. Pt has chest tightness and feels like he is going to pass out.

## 2024-03-31 NOTE — Discharge Instructions (Signed)
 Start taking your metoprolol  twice a day.  Follow-up with Dr. Lavona or the atrial fibrillation clinic next week.

## 2024-03-31 NOTE — ED Notes (Signed)
 Patient transported to X-ray

## 2024-03-31 NOTE — ED Provider Notes (Addendum)
 [place] EMERGENCY DEPARTMENT AT [facility] Provider Note   CSN: [id number] Arrival date & time: 03/31/24  9180     Patient presents with: Chest Pain   Donald Butler is a 63 y.o. male.   Patient states that he has had episodes of atrial fibs before but they usually do not last very long.  Patient states that he went into atrial fibs at 3 AM today and took metoprolol  but it has not helped  The history is provided by the patient and medical records.  Palpitations Palpitations quality:  Irregular Onset quality:  Sudden Timing:  Constant Progression:  Worsening Chronicity:  Recurrent Context: not anxiety   Relieved by:  Nothing Worsened by:  Nothing Ineffective treatments:  None tried Associated symptoms: no back pain, no chest pain and no cough        Prior to Admission medications   Medication Sig Start Date End Date Taking? Authorizing Provider  doxycycline  (VIBRAMYCIN ) 100 MG capsule Take 1 capsule (100 mg total) by mouth 2 (two) times daily. One po bid x 7 days 03/31/24  Yes Porshea Janowski, MD  Accu-Chek Softclix Lancets lancets Check BS morning , noon and bedtime Dx E11.9 06/08/23   Gladis Mustard, FNP  albuterol  (VENTOLIN  HFA) 108 (90 Base) MCG/ACT inhaler Inhale 2 puffs into the lungs every 6 (six) hours as needed for wheezing or shortness of breath. 10/17/22   Gladis, [name], FNP  allopurinol  (ZYLOPRIM ) 300 MG tablet Take 1 tablet (300 mg total) by mouth daily. 01/29/24   Gladis [name], FNP  amLODipine -benazepril  (LOTREL) 5-40 MG capsule Take 2 capsules by mouth daily. 01/29/24   Gladis [name], FNP  aspirin EC 81 MG tablet Take 81 mg by mouth daily.     [provider]  atorvastatin  (LIPITOR) 40 MG tablet Take 1 tablet (40 mg total) by mouth daily. 01/29/24   Gladis [name], FNP  Blood Glucose Monitoring Suppl (ACCU-CHEK GUIDE ME) w/Device KIT Check BS morning , noon and bedtime Dx E11.9 06/08/23   Gladis,  [name], FNP  Blood Glucose Monitoring Suppl DEVI 1 each by Does not apply route in the morning, at noon, and at bedtime. May substitute to any manufacturer covered by patient's insurance. 06/08/23   Gladis Mustard, FNP  colchicine  0.6 MG tablet TAKE 1 TABLET BY MOUTH ONCE DAILY AS NEEDED 10/06/22   Gladis, [name], FNP  glucose blood (ACCU-CHEK GUIDE) test strip Check BS morning , noon and bedtime Dx E11.9 06/08/23   Gladis Mustard, FNP  hydrocortisone  (ANUSOL -HC) 25 MG suppository Place 1 suppository (25 mg total) rectally at bedtime for 10 days. Patient not taking: Reported on 03/29/2024 03/21/24 03/31/24  Arletta Camie BRAVO, PA-C  metoprolol  tartrate (LOPRESSOR ) 25 MG tablet Take 1 tablet (25 mg total) by mouth 2 (two) times daily. Take 1 tablet twice a day as needed for palpitations Patient not taking: Reported on 03/29/2024 02/12/24 05/12/24  Daneen Damien BROCKS, NP  Multiple Vitamin (MULTIVITAMIN) tablet Take 1 tablet by mouth daily.  Patient not taking: Reported on 03/29/2024    [provider]  omeprazole  (PRILOSEC ) 20 MG capsule TAKE 1 CAPSULE BY MOUTH TWICE DAILY ON AN EMPTY STOMACH, THEN WAIT ONE HOUR TO EAT 01/29/24   Gladis, [name], FNP  Semaglutide , 2 MG/DOSE, (OZEMPIC , 2 MG/DOSE,) 8 MG/3ML SOPN Inject 2 mg into the skin once a week. 01/29/24   Gladis [name], FNP    Allergies: Penicillins    Review of Systems  Constitutional:  Negative for appetite change and fatigue.  HENT:  Negative for congestion, ear discharge and sinus pressure.   Eyes:  Negative for discharge.  Respiratory:  Negative for cough.   Cardiovascular:  Positive for palpitations. Negative for chest pain.  Gastrointestinal:  Negative for abdominal pain and diarrhea.  Genitourinary:  Negative for frequency and hematuria.  Musculoskeletal:  Negative for back pain.  Skin:  Negative for rash.  Neurological:  Negative for seizures and headaches.  Psychiatric/Behavioral:  Negative for  hallucinations.     Updated Vital Signs BP (!) 157/103   Pulse 84   Temp (!) 97.4 F (36.3 C) (Axillary)   Resp 16   Ht 5' 6 (1.676 m)   Wt 94.8 kg   SpO2 96%   BMI 33.73 kg/m   Physical Exam Vitals and nursing note reviewed.  Constitutional:      Appearance: He is well-developed.  HENT:     Head: Normocephalic.     Nose: Nose normal.     Mouth/Throat:     Mouth: Mucous membranes are moist.  Eyes:     General: No scleral icterus.    Conjunctiva/sclera: Conjunctivae normal.  Neck:     Thyroid : No thyromegaly.  Cardiovascular:     Rate and Rhythm: Normal rate. Rhythm irregular.     Heart sounds: No murmur heard.    No friction rub. No gallop.  Pulmonary:     Breath sounds: No stridor. No wheezing or rales.  Chest:     Chest wall: No tenderness.  Abdominal:     General: There is no distension.     Tenderness: There is no abdominal tenderness. There is no rebound.  Musculoskeletal:        General: Normal range of motion.     Cervical back: Neck supple.  Lymphadenopathy:     Cervical: No cervical adenopathy.  Skin:    Findings: No erythema or rash.  Neurological:     Mental Status: He is alert and oriented to person, place, and time.     Motor: No abnormal muscle tone.     Coordination: Coordination normal.  Psychiatric:        Behavior: Behavior normal.     (all labs ordered are listed, but only abnormal results are displayed) Labs Reviewed  BASIC METABOLIC PANEL WITH GFR - Abnormal; Notable for the following components:      Result Value   CO2 21 (*)    Glucose, Bld 111 (*)    All other components within normal limits  D-DIMER, QUANTITATIVE - Abnormal; Notable for the following components:   D-Dimer, Quant 1.11 (*)    All other components within normal limits  CBG MONITORING, ED - Abnormal; Notable for the following components:   Glucose-Capillary 113 (*)    All other components within normal limits  CBC  HEPATIC FUNCTION PANEL  TROPONIN I (HIGH  SENSITIVITY)  TROPONIN I (HIGH SENSITIVITY)    EKG: None  Radiology: CT Angio Chest PE W and/or Wo Contrast Result Date: 03/31/2024 CLINICAL DATA:  Chest pain, Pulmonary embolism (PE) suspected, high prob EXAM: CT ANGIOGRAPHY CHEST WITH CONTRAST TECHNIQUE: Multidetector CT imaging of the chest was performed using the standard protocol during bolus administration of intravenous contrast. Multiplanar CT image reconstructions and MIPs were obtained to evaluate the vascular anatomy. RADIATION DOSE REDUCTION: This exam was performed according to the departmental dose-optimization program which includes automated exposure control, adjustment of the mA and/or kV according to patient size and/or use of iterative reconstruction technique. CONTRAST:  75mL OMNIPAQUE  IOHEXOL  350  MG/ML SOLN COMPARISON:  03/31/2024 FINDINGS: Cardiovascular: This is a technically adequate evaluation of the pulmonary vasculature. There are no filling defects or pulmonary emboli. The heart is unremarkable without pericardial effusion. No evidence of thoracic aortic aneurysm or dissection. Atherosclerosis of the aorta and coronary vasculature. Mediastinum/Nodes: There is increased peribronchial soft tissue at the right hilum, greatest surrounding the central right upper lobe bronchus measuring 7 mm in thickness reference image 43/4. There is no discrete adenopathy identified. Subcentimeter mediastinal lymph nodes are noted, nonspecific, measuring up to 8 mm in the subcarinal region reference image 52/4. Thyroid , trachea, and esophagus are unremarkable. Lungs/Pleura: There is right-sided bronchial wall thickening, with scattered ground-glass opacities throughout the right upper lobe. Findings are most consistent with bronchitis and scattered bronchopneumonia. No effusion or pneumothorax. Upper Abdomen: No acute abnormality. Musculoskeletal: No acute or destructive bony abnormalities. Reconstructed imaging demonstrates no additional findings.  Review of the MIP images confirms the above findings. IMPRESSION: 1. No evidence of pulmonary embolus. 2. Right-sided bronchial wall thickening, with scattered right upper lobe ground-glass airspace disease compatible with scattered bronchopneumonia. Short interval follow-up in 6-8 weeks is recommended to ensure resolution. 3. Right hilar soft tissue prominence and scattered subcentimeter mediastinal lymph nodes, likely reactive given the findings described above. This could be reassessed on follow-up. 4. Aortic Atherosclerosis (ICD10-I70.0). Coronary artery atherosclerosis. Electronically Signed   By: Ozell Daring M.D.   On: 03/31/2024 10:51   DG Chest 2 View Result Date: 03/31/2024 CLINICAL DATA:  chest pain EXAM: CHEST - 2 VIEW COMPARISON:  August 31, 2023 FINDINGS: Resolution of the patchy airspace disease in the right lung base. No focal airspace consolidation, pleural effusion, or pneumothorax. Mild cardiomegaly. Aortic atherosclerosis. No acute fracture or destructive lesions. Multilevel thoracic osteophytosis. Scapular screw again noted. IMPRESSION: No acute cardiopulmonary abnormality. Electronically Signed   By: Rogelia Myers M.D.   On: 03/31/2024 10:17     Procedures   Medications Ordered in the ED  metoprolol  tartrate (LOPRESSOR ) injection 5 mg (5 mg Intravenous Given 03/31/24 0851)  iohexol  (OMNIPAQUE ) 350 MG/ML injection 80 mL (75 mLs Intravenous Contrast Given 03/31/24 1028)   Patient with persistent atrial fibs but rate controlled.  I spoke with Dr. Debera and he recommended that the patient take his metoprolol  twice a day and follow-up with the atrial fibrillation                                 Medical Decision Making Amount and/or Complexity of Data Reviewed Labs: ordered. Radiology: ordered.  Risk Prescription drug management.   Patient with atrial fibrillation that is controlled with metoprolol .  He will start taking metoprolol  twice a day and follow-up with his  cardiologist or the A-fib clinic next week.  Patient also has bronchial pneumonia and is started on doxycycline      Final diagnoses:  Paroxysmal atrial fibrillation (HCC)  Community acquired pneumonia, unspecified laterality    ED Discharge Orders          Ordered    doxycycline  (VIBRAMYCIN ) 100 MG capsule  2 times daily        03/31/24 1158    Ambulatory referral to Cardiology       Comments: If you have not heard from the Cardiology office within the next 72 hours please call [phone].   03/31/24 1159               Suzette Pac, MD 04/01/24 [phone]  Suzette Pac, MD 04/01/24 1511

## 2024-04-01 ENCOUNTER — Ambulatory Visit: Attending: Cardiology | Admitting: Cardiology

## 2024-04-01 ENCOUNTER — Other Ambulatory Visit (HOSPITAL_COMMUNITY): Payer: Self-pay

## 2024-04-01 ENCOUNTER — Encounter: Payer: Self-pay | Admitting: Cardiology

## 2024-04-01 ENCOUNTER — Telehealth: Payer: Self-pay | Admitting: Cardiology

## 2024-04-01 ENCOUNTER — Telehealth: Payer: Self-pay

## 2024-04-01 VITALS — BP 116/84 | HR 90 | Resp 16 | Ht 66.0 in | Wt 208.6 lb

## 2024-04-01 DIAGNOSIS — E785 Hyperlipidemia, unspecified: Secondary | ICD-10-CM

## 2024-04-01 DIAGNOSIS — I4891 Unspecified atrial fibrillation: Secondary | ICD-10-CM

## 2024-04-01 DIAGNOSIS — I25119 Atherosclerotic heart disease of native coronary artery with unspecified angina pectoris: Secondary | ICD-10-CM

## 2024-04-01 DIAGNOSIS — I10 Essential (primary) hypertension: Secondary | ICD-10-CM

## 2024-04-01 DIAGNOSIS — I700 Atherosclerosis of aorta: Secondary | ICD-10-CM

## 2024-04-01 LAB — SURGICAL PATHOLOGY

## 2024-04-01 MED ORDER — APIXABAN 5 MG PO TABS
5.0000 mg | ORAL_TABLET | Freq: Two times a day (BID) | ORAL | 0 refills | Status: DC
  Filled 2024-04-01: qty 60, 30d supply, fill #0

## 2024-04-01 MED ORDER — METOPROLOL TARTRATE 50 MG PO TABS
50.0000 mg | ORAL_TABLET | Freq: Two times a day (BID) | ORAL | 1 refills | Status: DC | PRN
  Filled 2024-04-01: qty 60, 30d supply, fill #0

## 2024-04-01 NOTE — Telephone Encounter (Signed)
-----   Message from Nurse Ronal SAUNDERS sent at 04/01/2024  5:30 PM EDT ----- Regarding: Move up 9/24 appt Hey! This pt was seen today by Dr. Michele for DOD appt. Pt is needing a sooner appt with Dr. Lavona whether that is in [place] or in [place] is his normal location to see him at). He is currently scheduled for 9/24 but due to having afib RVR, needs a much sooner appt.

## 2024-04-01 NOTE — Telephone Encounter (Signed)
 Patient c/o Palpitations:  STAT if patient reporting lightheadedness, shortness of breath, or chest pain  How long have you had palpitations/irregular HR/ Afib? Are you having the symptoms now? 32 hours, yes  Are you currently experiencing lightheadedness, SOB or CP? yes  Do you have a history of afib (atrial fibrillation) or irregular heart rhythm? Yes afib  Have you checked your BP or HR? (document readings if available): HR  120 (Resting)  Are you experiencing any other symptoms? headache

## 2024-04-01 NOTE — Telephone Encounter (Signed)
 Spoke with pt regarding an appointment. Pt rescheduled for 8/22 at [facility]. Appointment for [facility] canceled. Pt verbalized understanding. All questions if any were answered.

## 2024-04-01 NOTE — Progress Notes (Signed)
 Cardiology Office Note:  .   Date:  04/01/2024  ID:  Donald Butler, DOB 12/07/1960, MRN [id number] PCP:  Gladis Mustard, FNP  [facility] Providers Cardiologist:  Lynwood Schilling, MD  Electrophysiologist:  None  Click to update primary MD,subspecialty MD or APP then REFRESH:1}    Chief Complaint  Patient presents with   Follow-up    POD visit for atrial fibrillation and chest pain    History of Present Illness: .   Donald Butler is a 63 y.o. Caucasian male whose past medical history and cardiovascular risk factors includes: nonobstructive CAD, paroxysmal atrial fibrillation, mild mitral valve regurgitation, hypertension, hyperlipidemia, type 2 diabetes, obesity, GERD, and gout.  Patient follows with Dr. Schilling and I am seeing him on my DOD day for atrial fibrillation management.  Patient states that he has had a history of atrial fibrillation which was initially treated with medical therapy and anticoagulation and since the follow-up cardiac monitor did not show any significant atrial fibrillation burden anticoagulation was discontinued as part of a shared medical decision making process with his care team.  Patient is accompanied by his wife at today's visit.  In the past his episodes of A-fib with last no more than a minute, per patient.  However over the last 1 month he has been having increased frequency of A-fib and the most recent episode has been ongoing for at least 36 hours to his knowledge.  He experiences palpitations, chest pain, shortness of breath with effort related activities.  Symptoms are concerning enough that he went to the ED yesterday was given IV metoprolol  and blood work was performed.  D-dimers were elevated and CT PE protocol was performed which was negative for pulmonary embolism.  However he was noted to have pneumonia and started on medical therapy.  He presents today for DOD visit.  Still experiences palpitations, shortness of breath, and  with ambulation heart rate can be up to 150-160 bpm.  This is despite starting Lopressor  25 mg p.o. twice daily.  He denies near-syncope or syncopal events or hypotension.  Incidentally he also notes precordial pain, substernal, intensity 3 out of 10, last for few seconds, and intermittent, does not radiate, not brought on by effort related activities, and fluctuates with heart rate and A-fib burden.  Review of Systems: .   Review of Systems  Cardiovascular:  Positive for chest pain and palpitations. Negative for claudication, irregular heartbeat, leg swelling, near-syncope, orthopnea, paroxysmal nocturnal dyspnea and syncope.  Respiratory:  Positive for shortness of breath.   Hematologic/Lymphatic: Negative for bleeding problem.    Studies Reviewed:   EKG: EKG Interpretation Date/Time:  Friday April 01 2024 15:16:03 EDT Ventricular Rate:  112 PR Interval:    QRS Duration:  86 QT Interval:  300 QTC Calculation: 409 R Axis:   77  Text Interpretation: Atrial fibrillation with rapid ventricular response When compared with ECG of 31-Mar-2024 08:27, Since last tracing rate faster Confirmed by Michele Richardson [phone]) on 04/01/2024 3:59:21 PM  Echocardiogram: 02/26/2024  1. Left ventricular ejection fraction, by estimation, is 55 to 60%. Left  ventricular ejection fraction by 3D volume is 55 %. The left ventricle has  normal function. Left ventricular endocardial border not optimally defined  to evaluate regional wall  motion. Left ventricular diastolic parameters are consistent with Grade I  diastolic dysfunction (impaired relaxation). The average left ventricular  global longitudinal strain is -19.4 %. The global longitudinal strain is  normal.   2. Right ventricular systolic function  is normal. The right ventricular  size is normal. There is normal pulmonary artery systolic pressure.   3. The mitral valve is normal in structure. Trivial mitral valve  regurgitation. No evidence of mitral  stenosis.   4. The aortic valve was not well visualized. Aortic valve regurgitation  is not visualized. No aortic stenosis is present.   5. Aortic dilatation noted. There is moderate dilatation of the ascending  aorta, measuring 44 mm.   6. The inferior vena cava is normal in size with greater than 50%  respiratory variability, suggesting right atrial pressure of 3 mmHg.    RADIOLOGY: NA  Risk Assessment/Calculations:   Click Here to Calculate/Change CHADS2VASc Score The patient's CHADS2-VASc score is 2, indicating a 2.2% annual risk of stroke.   CHF History: No HTN History: Yes Diabetes History: No Stroke History: No Vascular Disease History: Yes  Labs:       Latest Ref Rng & Units 03/31/2024    8:34 AM 03/21/2024   10:17 AM 01/29/2024    8:09 AM  CBC  WBC 4.0 - 10.5 K/uL 6.8  9.2  6.6   Hemoglobin 13.0 - 17.0 g/dL 84.5  85.0  84.2   Hematocrit 39.0 - 52.0 % 43.5  44.0  46.8   Platelets 150 - 400 K/uL 225  203.0  239        Latest Ref Rng & Units 03/31/2024    8:34 AM 03/21/2024   10:17 AM 01/29/2024    8:09 AM  BMP  Glucose 70 - 99 mg/dL 888  96  890   BUN 8 - 23 mg/dL 18  21  15    Creatinine 0.61 - 1.24 mg/dL 9.18  9.16  9.13   BUN/Creat Ratio 10 - 24   17   Sodium 135 - 145 mmol/L 138  138  140   Potassium 3.5 - 5.1 mmol/L 4.0  3.9  4.8   Chloride 98 - 111 mmol/L 106  104  102   CO2 22 - 32 mmol/L 21  24  20    Calcium  8.9 - 10.3 mg/dL 9.2  9.3  9.7       Latest Ref Rng & Units 03/31/2024    8:51 AM 03/31/2024    8:34 AM 03/21/2024   10:17 AM  CMP  Glucose 70 - 99 mg/dL  888  96   BUN 8 - 23 mg/dL  18  21   Creatinine 9.38 - 1.24 mg/dL  9.18  9.16   Sodium 864 - 145 mmol/L  138  138   Potassium 3.5 - 5.1 mmol/L  4.0  3.9   Chloride 98 - 111 mmol/L  106  104   CO2 22 - 32 mmol/L  21  24   Calcium  8.9 - 10.3 mg/dL  9.2  9.3   Total Protein 6.5 - 8.1 g/dL 7.2   7.3   Total Bilirubin 0.0 - 1.2 mg/dL 0.8   0.7   Alkaline Phos 38 - 126 U/L 76   80   AST 15 - 41  U/L 29   30   ALT 0 - 44 U/L 30   32     Lab Results  Component Value Date   CHOL 222 (H) 01/29/2024   HDL 75 01/29/2024   LDLCALC 137 (H) 01/29/2024   TRIG 55 01/29/2024   CHOLHDL 3.0 01/29/2024   No results for input(s): LIPOA in the last 8760 hours. No components found for: NTPROBNP No results for input(s): PROBNP in  the last 8760 hours. Recent Labs    02/05/24 1211  TSH 1.780    Physical Exam:    Today's Vitals   04/01/24 1513  BP: 116/84  Pulse: 90  Resp: 16  SpO2: 96%  Weight: 208 lb 9.6 oz (94.6 kg)  Height: 5' 6 (1.676 m)   Body mass index is 33.67 kg/m. Wt Readings from Last 3 Encounters:  04/01/24 208 lb 9.6 oz (94.6 kg)  03/31/24 209 lb (94.8 kg)  03/29/24 209 lb (94.8 kg)    Physical Exam  Constitutional: No distress.  hemodynamically stable  Neck: No JVD present.  Cardiovascular: S1 normal and S2 normal. An irregularly irregular rhythm present. Tachycardia present. Exam reveals no gallop, no S3 and no S4.  No murmur heard. Pulmonary/Chest: Effort normal and breath sounds normal. No stridor. He has no wheezes. He has no rales.  Musculoskeletal:        General: No edema.     Cervical back: Neck supple.  Skin: Skin is warm.   Impression & Recommendation(s):  Impression:   ICD-10-CM   1. Atrial fibrillation with rapid ventricular response (HCC)  I48.91 EKG 12-Lead    2. Coronary artery disease involving native coronary artery of native heart with angina pectoris (HCC)  I25.119     3. Essential hypertension  I10     4. Hyperlipidemia LDL goal <70  E78.5     5. Aortic atherosclerosis (HCC)  I70.0        Recommendation(s):  Atrial fibrillation with rapid ventricular response (HCC) Symptomatic A-fib with RVR  Episodes of RVR more pronounced with ambulation. Recommended transfer to ED for symptomatic atrial fibrillation with rapid ventricular rate. Patient is hesitant as he just went to the ER yesterday and wants to try outpatient  therapy. I discussed with him that his duration of atrial fibrillation has been prolonged and needs to be started on anticoagulation and needs better rate control/rhythm control strategy.  He is adamant about not going to the ED without trying outpatient interventions. Currently on Lopressor  25 mg p.o. twice daily. Increase Lopressor  to 50 mg p.o. twice daily with holding parameters Start Eliquis  5 mg p.o. twice daily. Risks, benefits, and alternatives to anticoagulation discussed. Discontinue aspirin 81 mg p.o. daily given the initiation of anticoagulation for now. If ventricular rate does not improve or if he continues to have symptoms of shortness of breath/chest pain, palpitations, near-syncope/syncope he agrees to go to the closest ER via EMS. He will need to be on anticoagulation for at least 4 weeks after rhythm conversion.  And given his CHA2DS2-VASc score anticoagulation should be reconsidered long-term based on risk/benefit profile.  Will defer that to primary cardiologist at follow-up visit. Recommended follow-up with A-fib clinic; however, patient he refuses for now as he lives in [place]  and that is why he follows up with Dr. Lavona in [place]. Will try to arrange a sooner follow-up appointment Dr. Lavona given his clinical trajectory. His current A-fib episode likely precipitated by his underlying pneumonia. May consider cardioversion in 4 weeks if he is not back in sinus rhythm and to prevent future episodes of A-fib may consider EP evaluation for possible atrial fibrillation ablation.  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 Symptoms are not entirely anginal. Most recent echocardiogram notes preserved LVEF. If symptoms worsen patient is asked to go to the ED by EMS for further evaluation and management.  Essential hypertension  Office blood pressures are  acceptable. Currently on Lotrel. Uptitration of metoprolol  to tartrate as discussed above  Hyperlipidemia LDL goal <70 Aortic atherosclerosis (HCC) Remains on Lipitor 40 mg p.o. nightly  Medical Decision:  Complexity: High-symptomatic A-fib with rapid ventricular rate Prescription drug management: new initiation of anticoagulation and uptitration of AV nodal blocking agents. Recommended transfer to the hospital for escalation of hospital level care given his symptomatic A-fib with RVR (See above) Independently reviewed: ED documentation 03/31/2024, echocardiogram 02/26/2024 Independent historian: Wife  Orders Placed:  Orders Placed This Encounter  Procedures   EKG 12-Lead   Final Medication List:    Meds ordered this encounter  Medications   metoprolol  tartrate (LOPRESSOR ) 50 MG tablet    Sig: Take 1 tablet (50 mg total) by mouth 2 (two) times daily as needed for palpitations.    Dispense:  60 tablet    Refill:  1    Increasing to 50 mg twice daily   apixaban  (ELIQUIS ) 5 MG TABS tablet    Sig: Take 1 tablet (5 mg total) by mouth 2 (two) times daily.    Dispense:  60 tablet    Refill:  0    NEW START    Medications Discontinued During This Encounter  Medication Reason   aspirin EC 81 MG tablet Change in therapy   metoprolol  tartrate (LOPRESSOR ) 25 MG tablet      Current Outpatient Medications:    Accu-Chek Softclix Lancets lancets, Check BS morning , noon and bedtime Dx E11.9, Disp: 300 each, Rfl: 3   albuterol  (VENTOLIN  HFA) 108 (90 Base) MCG/ACT inhaler, Inhale 2 puffs into the lungs every 6 (six) hours as needed for wheezing or shortness of breath., Disp: 1 each, Rfl: 5   allopurinol  (ZYLOPRIM ) 300 MG tablet, Take 1 tablet (300 mg total) by mouth daily., Disp: 90 tablet, Rfl: 1   amLODipine -benazepril  (LOTREL) 5-40 MG capsule, Take 2 capsules by mouth daily., Disp: 180 capsule, Rfl: 1   apixaban  (ELIQUIS ) 5 MG TABS tablet, Take 1 tablet (5 mg total) by mouth 2 (two)  times daily., Disp: 60 tablet, Rfl: 0   atorvastatin  (LIPITOR) 40 MG tablet, Take 1 tablet (40 mg total) by mouth daily., Disp: 90 tablet, Rfl: 1   Blood Glucose Monitoring Suppl (ACCU-CHEK GUIDE ME) w/Device KIT, Check BS morning , noon and bedtime Dx E11.9, Disp: 1 kit, Rfl: 0   Blood Glucose Monitoring Suppl DEVI, 1 each by Does not apply route in the morning, at noon, and at bedtime. May substitute to any manufacturer covered by patient's insurance., Disp: 1 each, Rfl: 0   colchicine  0.6 MG tablet, TAKE 1 TABLET BY MOUTH ONCE DAILY AS NEEDED, Disp: 30 tablet, Rfl: 0   doxycycline  (VIBRAMYCIN ) 100 MG capsule, Take 1 capsule (100 mg total) by mouth 2 (two) times daily. One po bid x 7 days, Disp: 14 capsule, Rfl: 0   glucose blood (ACCU-CHEK GUIDE) test strip, Check BS morning , noon and bedtime Dx E11.9, Disp: 300 each, Rfl: 3   Multiple Vitamin (MULTIVITAMIN) tablet, Take 1 tablet by mouth daily. , Disp: , Rfl:    omeprazole  (PRILOSEC ) 20 MG capsule, TAKE 1 CAPSULE BY MOUTH TWICE DAILY ON AN EMPTY STOMACH, THEN WAIT ONE HOUR TO EAT, Disp: 180 capsule, Rfl: 1   Semaglutide , 2 MG/DOSE, (OZEMPIC , 2 MG/DOSE,) 8 MG/3ML SOPN, Inject 2 mg into the skin once a week., Disp: 9 mL, Rfl: 1   metoprolol  tartrate (LOPRESSOR ) 50 MG tablet, Take 1 tablet (50 mg  total) by mouth 2 (two) times daily as needed for palpitations., Disp: 60 tablet, Rfl: 1  Consent:   NA  Disposition:   4 weeks with Dr. Lavona  His questions and concerns were addressed to his satisfaction. He voices understanding of the recommendations provided during this encounter.    Signed, Madonna Michele HAS, [facility]  A Division of [facility] [address], [place], [place] [address]  [place], [place] [address] 04/01/2024 6:42 PM

## 2024-04-01 NOTE — Patient Instructions (Signed)
 Medication Instructions:  STOP Aspirin   START Eliquis  5 mg twice daily  INCREASE Metoprolol  Tartrate (Lopressor ) 50 mg twice daily  HOLD if systolic blood pressure (top number) is less than 100 and/ or heart rate is less than 55.  *If you need a refill on your cardiac medications before your next appointment, please call your pharmacy*  Lab Work: None ordered today. If you have labs (blood work) drawn today and your tests are completely normal, you will receive your results only by: MyChart Message (if you have MyChart) OR A paper copy in the mail If you have any lab test that is abnormal or we need to change your treatment, we will call you to review the results.  Testing/Procedures: None ordered today.  Follow-Up: At [facility], you and your health needs are our priority.  As part of our continuing mission to provide you with exceptional heart care, our providers are all part of one team.  This team includes your primary Cardiologist (physician) and Advanced Practice Providers or APPs (Physician Assistants and Nurse Practitioners) who all work together to provide you with the care you need, when you need it.  Your next appointment:   We are sending a message to our scheduling team to move up the currently 9/24 appointment with Dr. Lavona  Provider:   Lynwood Lavona, MD    We recommend signing up for the patient portal called MyChart.  Sign up information is provided on this After Visit Summary.  MyChart is used to connect with patients for Virtual Visits (Telemedicine).  Patients are able to view lab/test results, encounter notes, upcoming appointments, etc.  Non-urgent messages can be sent to your provider as well.   To learn more about what you can do with MyChart, go to [email or web address].   Other Instructions IF SYMPTOMS WORSEN (shortness of breath, low blood pressure, chest pain, etc), go to the emergency department to be evaluated.

## 2024-04-01 NOTE — Telephone Encounter (Signed)
 Spoke with pt who was seen at [facility] ED yesterday for At Fib.  He reports he has been in At Fib now for about 32 hours and feels awful.  He reports knowing when he is in At Fib because he can feel it.  While at [facility] he reports having blood work and was given IV medication for the AtFib which did not work. Reports he had blood work which demonstrated he may have a blood clot but CT R/O PE.  He was diagnosis pneumonia, sent home on antibiotics and advised to call the office.  Reports feeling poorly, having a headache, HR 120 to 130 bpm, has not taken BP.  He is taking Metoprolol  tartrate 25 mg BID which doesn't seem to be helping and ASA 81 mg daily.  Not taking anticoagulation.  Appt today with Dr Michele at 3:20 pm.  Pt is aware and appreciative of the assistance.

## 2024-04-04 ENCOUNTER — Ambulatory Visit: Attending: Cardiology | Admitting: Cardiology

## 2024-04-04 ENCOUNTER — Ambulatory Visit (HOSPITAL_COMMUNITY)
Admission: RE | Admit: 2024-04-04 | Discharge: 2024-04-04 | Disposition: A | Discharge status: Home / Self Care | Source: Ambulatory Visit | Attending: Cardiology | Admitting: Cardiology

## 2024-04-04 ENCOUNTER — Encounter: Payer: Self-pay | Admitting: Cardiology

## 2024-04-04 VITALS — BP 108/52 | HR 98 | Ht 67.0 in | Wt 207.0 lb

## 2024-04-04 DIAGNOSIS — Z01812 Encounter for preprocedural laboratory examination: Secondary | ICD-10-CM | POA: Diagnosis not present

## 2024-04-04 DIAGNOSIS — I4891 Unspecified atrial fibrillation: Secondary | ICD-10-CM

## 2024-04-04 DIAGNOSIS — J189 Pneumonia, unspecified organism: Secondary | ICD-10-CM

## 2024-04-04 DIAGNOSIS — Z0389 Encounter for observation for other suspected diseases and conditions ruled out: Secondary | ICD-10-CM | POA: Diagnosis not present

## 2024-04-04 NOTE — Progress Notes (Unsigned)
 Cardiology Office Note:   Date:  04/05/2024  ID:  Donald Butler, DOB Jan 12, 1961, MRN [id number] PCP: Gladis Mustard, FNP  [facility] Providers Cardiologist:  Lynwood Schilling, MD {  History of Present Illness:   Donald Butler is a 63 y.o. male  who presents for dizziness.    Coronary CT angiography showed a coronary artery calcium  score of 856 Agatston units placing him in the 97th percentile for age and gender, suggesting a high risk for future cardiac events.  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 Echocardiogram on 06/17/2018 showed normal left ventricular systolic function and regional wall motion, LVEF 60 to 65%, and mild mitral regurgitation.   He did have some palpitations in June 2023 had a monitor which a very short run of paroxysmal atrial fibrillation.  I saw him in March and he was doing OK.    He saw Dr. Michele last week.  And he was having increasing frequency of atrial fibrillation.  It would last for about 36 hours.  He went to the emergency room last week actually had IV metoprolol .  He had a CT which demonstrated no pulmonary embolism.  He was noted to have pneumonia.  He was having some intermittent sharp chest discomfort.  He was started back on anticoagulation which had been discontinued because of infrequent A-fib.  He did not want to go to the emergency room when he was seen in the office.  His beta-blocker was increased.  He was started back on the Eliquis .  Aspirin was discontinued.  He was started on oral antibiotics and has not felt any better over the weekend although he has not felt any worse.  He has been more dyspneic.  He has not been having any cough fevers or chills.  He has not been having any new shortness of breath but it seems to be baseline for this last week or so.  His heart rate seems to be a little bit slower.  He has been tolerating the beta-blocker.  Is not had any  presyncope or syncope.  He is not having any chest pressure, neck or arm discomfort.  ROS: As stated in the HPI and negative for all other systems.  Studies Reviewed:    EKG:     NA    Risk Assessment/Calculations:    CHA2DS2-VASc Score = 2   This indicates a 2.2% annual risk of stroke. The patient's score is based upon: CHF History: 0 HTN History: 1 Diabetes History: 0 Stroke History: 0 Vascular Disease History: 1 Age Score: 0 Gender Score: 0   Physical Exam:   VS:  BP (!) 108/52   Pulse 98   Ht 5' 7 (1.702 m)   Wt 207 lb (93.9 kg)   SpO2 97%   BMI 32.42 kg/m    Wt Readings from Last 3 Encounters:  04/04/24 207 lb (93.9 kg)  04/01/24 208 lb 9.6 oz (94.6 kg)  03/31/24 209 lb (94.8 kg)     GEN: Well nourished, well developed in no acute distress NECK: No JVD; No carotid bruits CARDIAC: Irregular RR, no murmurs, rubs, gallops RESPIRATORY:  Clear to auscultation without rales, wheezing or rhonchi  ABDOMEN: Soft, non-tender, non-distended EXTREMITIES:  No edema; No deformity   ASSESSMENT AND PLAN:   Atrial fibrillation with rapid ventricular response (HCC): The patient's rate seems to be better controlled.  He is taking his Eliquis .  However, he still not  feeling well despite being treated with antibiotics and I wonder if this is predominantly related to the A-fib rather than a pneumonia.  I am going to plan for him to have TEE cardioversion.  Unfortunately he took his semaglutide  yesterday and so he will have to wait until Monday of next week to get this done.  He knows to present to the emergency room should he have any worsening symptoms.  He knows not to miss his anticoagulation.  Coronary artery disease involving native coronary artery of native heart with angina pectoris [employer]): He has had no chest discomfort suggestive of ischemia.  He will continue with risk reduction.  Essential hypertension: His blood pressure is controlled.  No change in therapy.    Hyperlipidemia LDL goal <70: Continue Lipitor.  No change in therapy.  Pneumonia: I will get a chest x-ray and a CBC today.  His exam is fairly benign.  He is not getting worse over the weekend as above.  He is not having any cough fevers or chills.  I think we can continue to manage him with oral antibiotics.  DM: He is going to remain off his semaglutide  until after cardioversion.  Medical decision making: High complexity, pneumonia, atrial fibrillation with rapid rate    Follow up with me after the DCCV.   Signed, Lynwood Schilling, MD

## 2024-04-04 NOTE — Telephone Encounter (Signed)
 Spoke with pt, he is still in a fib and was wanting to know if ER was still recommended. Patient will come to the office today at 12 noon to see dr hochrein.

## 2024-04-04 NOTE — Telephone Encounter (Signed)
 Patient is following up. He plans to go to the ED this morning and he would like some direction. He says when he spoke with the nurse previously he was advised that there is something Dr. Michele can put in the computer, perhaps in his chart, to potentially help navigate or expedite his admission process.

## 2024-04-04 NOTE — Patient Instructions (Addendum)
 Medication Instructions:  Your physician recommends that you continue on your current medications as directed. Please refer to the Current Medication list given to you today.  *If you need a refill on your cardiac medications before your next appointment, please call your pharmacy*  Lab Work: CBC, BMET today If you have labs (blood work) drawn today and your tests are completely normal, you will receive your results only by: MyChart Message (if you have MyChart) OR A paper copy in the mail If you have any lab test that is abnormal or we need to change your treatment, we will call you to review the results.  Testing/Procedures: Chest xray  Follow-Up: At [name], you and your health needs are our priority.  As part of our continuing mission to provide you with exceptional heart care, our providers are all part of one team.  This team includes your primary Cardiologist (physician) and Advanced Practice Providers or APPs (Physician Assistants and Nurse Practitioners) who all work together to provide you with the care you need, when you need it.  Your next appointment:   2 months in afib clinic  Provider:   Afib clinic  We recommend signing up for the patient portal called MyChart.  Sign up information is provided on this After Visit Summary.  MyChart is used to connect with patients for Virtual Visits (Telemedicine).  Patients are able to view lab/test results, encounter notes, upcoming appointments, etc.  Non-urgent messages can be sent to your provider as well.   To learn more about what you can do with MyChart, go to [email or web address].   Other Instructions     Dear Donald Butler  You are scheduled for a TEE (Transesophageal Echocardiogram) Guided Cardioversion on Wednesday, August 20 with Dr. Francyne.  Please arrive at the [facility] (Main Entrance A) at [facility]: [address] [place], [place] [address] at 10:30 AM (This time is 1 hour(s) before your  procedure to ensure your preparation).   Free valet parking service is available. You will check in at ADMITTING.   *Please Note: You will receive a call the day before your procedure to confirm the appointment time. That time may have changed from the original time based on the schedule for that day.*   DIET:  Nothing to eat or drink after midnight except a sip of water with medications (see medication instructions below)  MEDICATION INSTRUCTIONS: !!IF ANY NEW MEDICATIONS ARE STARTED AFTER TODAY, PLEASE NOTIFY YOUR PROVIDER AS SOON AS POSSIBLE!!  FYI: Medications such as Semaglutide  (Ozempic , Wegovy ), Tirzepatide  (Mounjaro, Zepbound ), Dulaglutide (Trulicity), etc (GLP1 agonists) AND Canagliflozin (Invokana), Dapagliflozin (Farxiga), Empagliflozin (Jardiance), Ertugliflozin (Steglatro), Bexagliflozin [employer]) or any combination with one of these drugs such as Invokamet (Canagliflozin/Metformin), Synjardy (Empagliflozin/Metformin), etc (SGLT2 inhibitors) must be held around the time of a procedure. This is not a comprehensive list of all of these drugs. Please review all of your medications and talk to your provider if you take any one of these. If you are not sure, ask your provider.   HOLD: Semaglutide  (Ozempic , Rybelsus , Wegovy )   Continue taking your anticoagulant (blood thinner): Apixaban  (Eliquis ).  You will need to continue this after your procedure until you are told by your provider that it is safe to stop.    LABS:  CBC, BMET today   FYI:  For your safety, and to allow us  to monitor your vital signs accurately during the surgery/procedure we request: If you have artificial nails, gel coating, SNS etc, please  have those removed prior to your surgery/procedure. Not having the nail coverings /polish removed may result in cancellation or delay of your surgery/procedure.  Your support person will be asked to wait in the waiting room during your procedure.  It is OK to have someone  drop you off and come back when you are ready to be discharged.  You cannot drive after the procedure and will need someone to drive you home.  Bring your insurance cards.  *Special Note: Every effort is made to have your procedure done on time. Occasionally there are emergencies that occur at the hospital that may cause delays. Please be patient if a delay does occur.

## 2024-04-05 ENCOUNTER — Encounter: Payer: Self-pay | Admitting: Cardiology

## 2024-04-05 ENCOUNTER — Ambulatory Visit: Payer: Self-pay | Admitting: Gastroenterology

## 2024-04-05 LAB — CBC
Hematocrit: 46.4 % (ref 37.5–51.0)
Hemoglobin: 15.6 g/dL (ref 13.0–17.7)
MCH: 32.6 pg (ref 26.6–33.0)
MCHC: 33.6 g/dL (ref 31.5–35.7)
MCV: 97 fL (ref 79–97)
Platelets: 292 x10E3/uL (ref 150–450)
RBC: 4.79 x10E6/uL (ref 4.14–5.80)
RDW: 13.1 % (ref 11.6–15.4)
WBC: 8.3 x10E3/uL (ref 3.4–10.8)

## 2024-04-05 LAB — BASIC METABOLIC PANEL WITH GFR
BUN/Creatinine Ratio: 21 (ref 10–24)
BUN: 21 mg/dL (ref 8–27)
CO2: 19 mmol/L — AB (ref 20–29)
Calcium: 9.5 mg/dL (ref 8.6–10.2)
Chloride: 103 mmol/L (ref 96–106)
Creatinine, Ser: 1.01 mg/dL (ref 0.76–1.27)
Glucose: 95 mg/dL (ref 70–99)
Potassium: 4.3 mmol/L (ref 3.5–5.2)
Sodium: 140 mmol/L (ref 134–144)
eGFR: 83 mL/min/1.73 (ref >59)

## 2024-04-05 NOTE — Telephone Encounter (Signed)
 Pt calling in asking to speak with a nurse. He has a lot of questions/concerns about procedure coming up.

## 2024-04-05 NOTE — Telephone Encounter (Signed)
 Spoke with pt regarding his upcoming TEE/Cardioversion. Pt stated he is no longer in afib according to his Apple Watch EKG. The pt's blood pressure monitor also said that he was in sinus rhythm. Pt is wondering what next steps are or if he still needs to have the TEE/Cardioversion. Pt was told the information he provided would be sent to Dr. Lavona for his suggestions. Pt verbalized understanding. All questions if any were answered.

## 2024-04-05 NOTE — Telephone Encounter (Signed)
 See pt message from 8/18

## 2024-04-06 ENCOUNTER — Telehealth: Payer: Self-pay

## 2024-04-06 ENCOUNTER — Telehealth: Payer: Self-pay | Admitting: Cardiology

## 2024-04-06 ENCOUNTER — Ambulatory Visit: Payer: Self-pay | Admitting: Cardiology

## 2024-04-06 ENCOUNTER — Encounter: Payer: Self-pay | Admitting: Cardiology

## 2024-04-06 NOTE — Telephone Encounter (Signed)
 Spoke with pt regarding Dr. Denver suggestions. Pt is uncomfortable driving to [facility] to have EKG done at Nurse Visit. Pt is upset that we do not believe he is in NSR. Pt was told that we believe him but that an EKG is necessary to confirm. Pt was still upset. Dr. Lavona plans to call the pt after clinic today 8/20. Pt verbalized understanding. All questions if any were answered.

## 2024-04-06 NOTE — Telephone Encounter (Signed)
 Patient c/o Palpitations:  STAT if patient reporting lightheadedness, shortness of breath, or chest pain  How long have you had palpitations/irregular HR/ Afib? Are you having the symptoms now?   No  Are you currently experiencing lightheadedness, SOB or CP?   No  Do you have a history of afib (atrial fibrillation) or irregular heart rhythm?   Have you checked your BP or HR? (document readings if available):   Are you experiencing any other symptoms?     Patient wants a call back to discuss next steps as he is no longer in afib.  Patient also wants to know if he will still need to be taking medication.

## 2024-04-06 NOTE — Telephone Encounter (Signed)
 Spoke with pt regarding Dr. Denver suggestions. [facility] was called and an appointment was set up for the pt to have an EKG on 8/21. They will fax us  the EKG. Pt was scheduled for a 1 month follow up with [facility]ochrein on 9/26. TEE/DCCV will be canceled. Pt verbalized understanding. All questions if any were answered.

## 2024-04-06 NOTE — Telephone Encounter (Signed)
-----   Message from Lynwood Schilling sent at 04/06/2024  1:50 PM EDT ----- Can we please check an EKG .  Can we get this done at done at [facility]?  Please cancel the TEE/DCCV as his watch says he is in NSR.  If he could figure out how to send the strips via My Chart he would not need an EKG.  Please schedule one month follow up with me.

## 2024-04-06 NOTE — Telephone Encounter (Signed)
 Spoke with pt and let him know that he does not need an EKG at PCP office per Dr. Lavona. Pt will call and cancel appointment. Pt verbalized understanding. All questions if any were answered.

## 2024-04-07 ENCOUNTER — Ambulatory Visit

## 2024-04-08 ENCOUNTER — Ambulatory Visit: Admitting: Cardiology

## 2024-04-11 ENCOUNTER — Telehealth: Payer: Self-pay

## 2024-04-11 ENCOUNTER — Encounter (HOSPITAL_COMMUNITY): Admission: RE | Payer: Self-pay | Source: Home / Self Care

## 2024-04-11 ENCOUNTER — Ambulatory Visit (HOSPITAL_COMMUNITY): Admission: RE | Admit: 2024-04-11 | Source: Home / Self Care | Admitting: Cardiovascular Disease

## 2024-04-11 SURGERY — TRANSESOPHAGEAL ECHOCARDIOGRAM (TEE) (CATHLAB)
Anesthesia: Monitor Anesthesia Care

## 2024-04-11 NOTE — Telephone Encounter (Signed)
 Called and spoke with patient and he said that everything had already been taken care of and he no longer needed the EKG. He was able to upload his apple watch reading to Mychart and they accepted that

## 2024-04-11 NOTE — Telephone Encounter (Signed)
-----   Message from [facility] sent at 04/11/2024  9:13 AM EDT ----- Looks like they want patient to come in and have EKG ----- Message ----- From: Lavona Agent, MD Sent: 04/06/2024   1:53 PM EDT To: Marjie Lunger, FNP; Aleck SAILOR Schlan#  Can we please check an EKG .  Can we get this done at done at [facility]?  Please cancel the TEE/DCCV as his watch says he is in NSR.  If he could figure out how to send the strips via My Chart he would not need an EKG.  Please schedule one month follow up with me.

## 2024-05-04 ENCOUNTER — Ambulatory Visit: Admitting: Gastroenterology

## 2024-05-11 ENCOUNTER — Ambulatory Visit: Admitting: Cardiology

## 2024-05-12 DIAGNOSIS — I4891 Unspecified atrial fibrillation: Secondary | ICD-10-CM | POA: Insufficient documentation

## 2024-05-12 DIAGNOSIS — E785 Hyperlipidemia, unspecified: Secondary | ICD-10-CM | POA: Insufficient documentation

## 2024-05-12 NOTE — Progress Notes (Unsigned)
 Cardiology Office Note:   Date:  05/13/2024  ID:  [name] [name], DOB 1960-08-30, MRN [id number] PCP: Gladis Mustard, FNP  [facility] Providers Cardiologist:  Lynwood Schilling, MD {  History of Present Illness:   Donald Butler is a 63 y.o. male who presents for dizziness.    Coronary CT angiography showed a coronary artery calcium  score of 856 Agatston units placing him in the 97th percentile for age and gender, suggesting a high risk for future cardiac events.  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 Echocardiogram on 06/17/2018 showed normal left ventricular systolic function and regional wall motion, LVEF 60 to 65%, and mild mitral regurgitation.   He did have some palpitations in June 2023 had a monitor which a very short run of paroxysmal atrial fibrillation.  He saw Dr. Michele.  And he was having increasing frequency of atrial fibrillation.  It would last for about 36 hours.  He went to the emergency room last week actually had IV metoprolol .  He had a CT which demonstrated no pulmonary embolism.  He was noted to have pneumonia.  He was having some intermittent sharp chest discomfort.  He was started back on anticoagulation which had been discontinued because of infrequent A-fib.    He thinks that since getting over pneumonia he has had no further tachypalpitations.  He wears his Apple Watch and has not alerted him to any atrial fibrillation.  He really does not want to be taking the medications and he is only taking Eliquis  once a day because he does not want take it on an empty stomach.  He is not having any tachypalpitations.  He denies any chest pressure, neck or arm discomfort.  He said no new shortness of breath, PND or orthopnea.  He said no weight gain or edema.   ROS: As stated in the HPI and negative for all other systems.  Studies Reviewed:    EKG:         Risk Assessment/Calculations:     CHA2DS2-VASc Score = 2   This indicates a 2.2% annual risk of stroke. The patient's score is based upon: CHF History: 0 HTN History: 1 Diabetes History: 0 Stroke History: 0 Vascular Disease History: 1 Age Score: 0 Gender Score: 0       Physical Exam:   VS:  BP (!) 132/90 (BP Location: Left Arm, Cuff Size: Normal)   Pulse 70   Ht 5' 7 (1.702 m)   Wt 211 lb 4 oz (95.8 kg)   BMI 33.09 kg/m    Wt Readings from Last 3 Encounters:  05/13/24 211 lb 4 oz (95.8 kg)  04/04/24 207 lb (93.9 kg)  04/01/24 208 lb 9.6 oz (94.6 kg)     GEN: Well nourished, well developed in no acute distress NECK: No JVD; No carotid bruits CARDIAC: RRR, no murmurs, rubs, gallops RESPIRATORY:  Clear to auscultation without rales, wheezing or rhonchi  ABDOMEN: Soft, non-tender, non-distended EXTREMITIES:  No edema; No deformity   ASSESSMENT AND PLAN:   Atrial fibrillation with rapid ventricular response (HCC):    This may have been related to a pneumonia that was incidentally diagnosed.  He is in sinus rhythm.  I am going to check a 4-week monitor to make sure he is not having any significant burden of atrial fibrillation and he would like to come off of anticoagulation.  In the meantime since he is only taking it  once a day I am going to switch him to Xarelto .  I am also going to switch him to Toprol -XL and reduce the dose slightly since he is having trouble taking medicines twice a day.   Coronary artery disease involving native coronary artery of native heart with angina pectoris : He is having no new symptoms.  No change in therapy.  He will continue with risk reduction.   Essential hypertension: His blood pressure is typically well-controlled.  I am going to change the meds as above.   Hyperlipidemia LDL goal <70: LDL was 137. He was not on his Lipitor when this was drawn.  He is now back on this and we can check a lipid in about 2 months.     DM: A1c is 5.2.  No change in therapy.     Follow up  with the EP clinic in about 3 months.  Signed, Lynwood Schilling, MD

## 2024-05-13 ENCOUNTER — Encounter: Payer: Self-pay | Admitting: Cardiology

## 2024-05-13 ENCOUNTER — Ambulatory Visit: Attending: Cardiology | Admitting: Cardiology

## 2024-05-13 VITALS — BP 132/90 | HR 70 | Ht 67.0 in | Wt 211.2 lb

## 2024-05-13 DIAGNOSIS — I2510 Atherosclerotic heart disease of native coronary artery without angina pectoris: Secondary | ICD-10-CM | POA: Diagnosis not present

## 2024-05-13 DIAGNOSIS — E785 Hyperlipidemia, unspecified: Secondary | ICD-10-CM

## 2024-05-13 DIAGNOSIS — I4891 Unspecified atrial fibrillation: Secondary | ICD-10-CM | POA: Diagnosis not present

## 2024-05-13 DIAGNOSIS — E119 Type 2 diabetes mellitus without complications: Secondary | ICD-10-CM

## 2024-05-13 DIAGNOSIS — I10 Essential (primary) hypertension: Secondary | ICD-10-CM | POA: Diagnosis not present

## 2024-05-13 MED ORDER — METOPROLOL SUCCINATE ER 50 MG PO TB24: 50.0000 mg | ORAL_TABLET | Freq: Every day | ORAL | 3 refills | Status: DC

## 2024-05-13 MED ORDER — RIVAROXABAN 20 MG PO TABS: 20.0000 mg | ORAL_TABLET | Freq: Every day | ORAL | 3 refills | Status: DC

## 2024-05-13 NOTE — Patient Instructions (Signed)
 Medication Instructions:  Stop Metoprolol  Tartrate Start Metoprolol  Succinate 50 mg daily Stop Eliquis  Start Xarletto 20 mg daily  Continue all other medications *If you need a refill on your cardiac medications before your next appointment, please call your pharmacy*  Lab Work: Have Lipid Panel in 2 months   Testing/Procedures: 30 day Heart Monitor  Follow-Up: At [facility], you and your health needs are our priority.  As part of our continuing mission to provide you with exceptional heart care, our providers are all part of one team.  This team includes your primary Cardiologist (physician) and Advanced Practice Providers or APPs (Physician Assistants and Nurse Practitioners) who all work together to provide you with the care you need, when you need it.  Your next appointment:  3 months    Provider:  Afib Clinic    We recommend signing up for the patient portal called MyChart.  Sign up information is provided on this After Visit Summary.  MyChart is used to connect with patients for Virtual Visits (Telemedicine).  Patients are able to view lab/test results, encounter notes, upcoming appointments, etc.  Non-urgent messages can be sent to your provider as well.   To learn more about what you can do with MyChart, go to [email or web address].

## 2024-05-13 NOTE — Addendum Note (Signed)
 Addended by: CHRISTIANNE CHANNING PARAS on: 05/13/2024 11:47 AM   Modules accepted: Orders

## 2024-05-19 DIAGNOSIS — I4891 Unspecified atrial fibrillation: Secondary | ICD-10-CM | POA: Diagnosis not present

## 2024-06-06 ENCOUNTER — Ambulatory Visit (HOSPITAL_COMMUNITY): Admitting: Physician Assistant

## 2024-06-07 NOTE — Progress Notes (Signed)
 Donald Butler                                          MRN: [id number]   06/07/2024   The VBCI Quality Team Specialist reviewed this patient medical record for the purposes of chart review for care gap closure. The following were reviewed: abstraction for care gap closure-glycemic status assessment.    VBCI Quality Team

## 2024-06-07 NOTE — Progress Notes (Signed)
 Donald Butler                                          MRN: [id number]   06/07/2024   The VBCI Quality Team Specialist reviewed this patient medical record for the purposes of chart review for care gap closure. The following were reviewed: chart review for care gap closure-kidney health evaluation for diabetes:eGFR  and uACR.    VBCI Quality Team

## 2024-06-20 ENCOUNTER — Ambulatory Visit: Attending: Cardiology

## 2024-06-20 DIAGNOSIS — I4891 Unspecified atrial fibrillation: Secondary | ICD-10-CM | POA: Diagnosis not present

## 2024-06-21 ENCOUNTER — Ambulatory Visit: Payer: Self-pay | Admitting: Cardiology

## 2024-07-04 ENCOUNTER — Ambulatory Visit (HOSPITAL_COMMUNITY): Admitting: Physician Assistant

## 2024-07-04 NOTE — Progress Notes (Incomplete)
 Primary Care Physician: Gladis Mustard, FNP Primary Cardiologist: Lynwood Schilling, MD Electrophysiologist: None  Referring Physician: Dr. Schilling Donald Butler is a 63 y.o. male with a history of nonobstructive CAD, paroxysmal AF (not on AC), mild MVR, HTN, HLD, DM type II, obesity, GERD, who presents for consultation in the [facility].  The patient was initially diagnosed with atrial fibrillation in 2023 by event monitor and was found to have very low burden and DOAC was deferred.  He was seen by the DOD on 04/01/2024 for increased episode of palpitations along with shortness of breath.  He was advised to go to the ED for further evaluation however deferred recommendation at that time.  He was started on anticoagulation with Eliquis  and metoprolol  was increased with advisement to go to the ED if burden increased or symptoms persisted.  He was seen by Dr. Schilling on 04/04/24 and reported that he still did not feel well.  Dr. Schilling planned for DCCV the following week however patient had converted spontaneously at home.  He was last seen on 05/13/2024 and reported no further tachypalpitations.  He was monitoring his A-fib with an Apple Watch and noted only taking Eliquis  once a day because he did not want to take it on an empty stomach.  He expressed a desire to come off of his anticoagulation and Dr. Schilling ordered an event monitor that showed sinus rhythm with no evidence of AF.  He discontinued his Xarelto  on 06/21/24 and patient resumed ASA 81 mg.  He was advised to go to the AF clinic for further recommendation regarding discontinuation of rate control medication.  Patient presents today for follow up for atrial fibrillation. ***  Discussed the use of AI scribe software for clinical note transcription with the patient, who gave verbal consent to proceed.  History of Present Illness   Today, he denies symptoms of ***palpitations, chest pain, shortness of  breath, orthopnea, PND, lower extremity edema, dizziness, presyncope, syncope, snoring, daytime somnolence, bleeding, or neurologic sequela. The patient is tolerating medications without difficulties and is otherwise without complaint today.    Atrial Fibrillation Risk Factors:  he {Action; does/does not:19097} have symptoms or diagnosis of sleep apnea. he does not have a history of rheumatic fever. he {Action; does/does not:19097} have a history of alcohol use. The patient {Action; does/does not:19097} have a history of early familial atrial fibrillation or other arrhythmias.  Atrial Fibrillation Management history:  Previous antiarrhythmic drugs: None Previous cardioversions: None Previous ablations: None Anticoagulation history: Eliquis  and Xarelto   ROS- All systems are reviewed and negative except as per the HPI above.  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 1 each 5   allopurinol  (  ZYLOPRIM ) 300 MG tablet Take 1 tablet (300 mg total) by mouth daily. 90 tablet 1   amLODipine -benazepril  (LOTREL) 5-40 MG capsule Take 2 capsules by mouth daily. (Patient not taking: Reported on 05/13/2024) 180 capsule 1   atorvastatin  (LIPITOR) 40 MG tablet Take 1 tablet (40 mg total) by mouth daily. 90 tablet 1   Blood Glucose Monitoring Suppl  (ACCU-CHEK GUIDE ME) w/Device KIT Check BS morning , noon and bedtime Dx E11.9 1 kit 0   Blood Glucose Monitoring Suppl DEVI 1 each by Does not apply route in the morning, at noon, and at bedtime. May substitute to any manufacturer covered by patient's insurance. 1 each 0   colchicine  0.6 MG tablet TAKE 1 TABLET BY MOUTH ONCE DAILY AS NEEDED 30 tablet 0   doxycycline  (VIBRAMYCIN ) 100 MG capsule Take 1 capsule (100 mg total) by mouth 2 (two) times daily. One po bid x 7 days (Patient not taking: Reported on 05/13/2024) 14 capsule 0   glucose blood (ACCU-CHEK GUIDE) test strip Check BS morning , noon and bedtime Dx E11.9 300 each 3   metoprolol  succinate (TOPROL -XL) 50 MG 24 hr tablet Take 1 tablet (50 mg total) by mouth daily. Take with or immediately following a meal. 90 tablet 3   Multiple Vitamin (MULTIVITAMIN) tablet Take 1 tablet by mouth daily.  (Patient not taking: Reported on 05/13/2024)     omeprazole  (PRILOSEC ) 20 MG capsule TAKE 1 CAPSULE BY MOUTH TWICE DAILY ON AN EMPTY STOMACH, THEN WAIT ONE HOUR TO EAT 180 capsule 1   rivaroxaban  (XARELTO ) 20 MG TABS tablet Take 1 tablet (20 mg total) by mouth daily with supper. 90 tablet 3   Semaglutide , 2 MG/DOSE, (OZEMPIC , 2 MG/DOSE,) 8 MG/3ML SOPN Inject 2 mg into the skin once a week. 9 mL 1   No current facility-administered medications for this visit.    Physical Exam: There were no vitals taken for this visit.  GEN: Well nourished, well developed in no acute distress NECK: No JVD; No carotid bruits CARDIAC: {EPRHYTHM:28826}, no murmurs, rubs, gallops RESPIRATORY:  Clear to auscultation without rales, wheezing or rhonchi  ABDOMEN: Soft, non-tender, non-distended EXTREMITIES:  No edema; No deformity   Wt Readings from Last 3 Encounters:  05/13/24 95.8 kg  04/04/24 93.9 kg  04/01/24 94.6 kg     EKG today demonstrates  ***  Echo 02/26/2024 demonstrated  1. Left ventricular ejection fraction, by estimation, is 55 to 60%. Left   ventricular ejection fraction by 3D volume is 55 %. The left ventricle has  normal function. Left ventricular endocardial border not optimally defined  to evaluate regional wall  motion. Left ventricular diastolic parameters are consistent with Grade I  diastolic dysfunction (impaired relaxation). The average left ventricular  global longitudinal strain is -19.4 %. The global longitudinal strain is  normal.   2. Right ventricular systolic function is normal. The right ventricular  size is normal. There is normal pulmonary artery systolic pressure.   3. The mitral valve is normal in structure. Trivial mitral valve  regurgitation. No evidence of mitral stenosis.   4. The aortic valve was not well visualized. Aortic valve regurgitation  is not visualized. No aortic stenosis is present.   5. Aortic dilatation noted. There is moderate dilatation of the ascending  aorta, measuring 44 mm.   6. The inferior vena cava is normal in size with greater than 50%  respiratory variability, suggesting right atrial pressure of 3 mmHg.    CHA2DS2-VASc Score = 2  The patient's score is based upon: CHF History: 0 HTN History: 1 Diabetes History: 0 Stroke History: 0 Vascular Disease History: 1 Age Score: 0 Gender Score: 0   {Confirm score is correct.  If not, click here to update score.  REFRESH note.  :1}    ASSESSMENT AND PLAN: Paroxysmal Atrial Fibrillation (ICD10:  I48.0) The patient's CHA2DS2-VASc score is 2, indicating a 2.2% annual risk of stroke.  ***  Nonobstructive CAD  Hyperlipidemia  Essential hypertension  Secondary Hypercoagulable State (ICD10:  D68.69){Click to add to Prob List or Visit Dx  :[id number]} The patient is at significant risk for stroke/thromboembolism based upon his CHA2DS2-VASc Score of 2.  {Anticoag or No Anticoag  :[id number]}    Signed,  Wyn Raddle, Jackee Shove, NP    07/04/2024 7:35 AM    Assessment and Plan Assessment & Plan        Follow up  ***  {Are you ordering a CV Procedure (e.g. stress test, cath, DCCV, TEE, etc)?   Press F2        :[id number]}    [facility] [address] [place], [place] [address] [phone]

## 2024-07-11 ENCOUNTER — Ambulatory Visit (HOSPITAL_COMMUNITY)
Admission: RE | Admit: 2024-07-11 | Discharge: 2024-07-11 | Disposition: A | Discharge status: Home / Self Care | Source: Ambulatory Visit | Attending: Internal Medicine | Admitting: Internal Medicine

## 2024-07-11 ENCOUNTER — Encounter (HOSPITAL_COMMUNITY): Payer: Self-pay | Admitting: Internal Medicine

## 2024-07-11 VITALS — BP 144/90 | HR 83 | Ht 67.0 in | Wt 215.8 lb

## 2024-07-11 DIAGNOSIS — D6869 Other thrombophilia: Secondary | ICD-10-CM | POA: Diagnosis not present

## 2024-07-11 DIAGNOSIS — I480 Paroxysmal atrial fibrillation: Secondary | ICD-10-CM | POA: Diagnosis not present

## 2024-07-11 MED ORDER — METOPROLOL SUCCINATE ER 25 MG PO TB24: 25.0000 mg | ORAL_TABLET | Freq: Every day | ORAL | 3 refills | Status: DC

## 2024-07-11 NOTE — Patient Instructions (Signed)
 Decrease metoprolol to 25 mg once daily.

## 2024-07-11 NOTE — Progress Notes (Addendum)
 Primary Care Physician: Gladis Mustard, FNP Primary Cardiologist: Lynwood Schilling, MD Electrophysiologist: None  Referring Physician: Dr. Schilling Donald Butler is a 63 y.o. male with a history of nonobstructive CAD, paroxysmal AF (not on AC), mild MVR, HTN, HLD, DM type II, obesity, GERD, who presents for consultation in the [facility].  The patient was initially diagnosed with atrial fibrillation in 2023 by event monitor and was found to have very low burden and DOAC was deferred.  He was seen by the DOD on 04/01/2024 for increased episode of palpitations along with shortness of breath.  He was advised to go to the ED for further evaluation however deferred recommendation at that time.  He was started on anticoagulation with Eliquis  and metoprolol  was increased with advisement to go to the ED if burden increased or symptoms persisted.  He was seen by Dr. Schilling on 04/04/24 and reported that he still did not feel well.  Dr. Schilling planned for DCCV the following week however patient had converted spontaneously at home.  He was last seen on 05/13/2024 and reported no further tachypalpitations.  He was monitoring his A-fib with an Apple Watch and noted only taking Eliquis  once a day because he did not want to take it on an empty stomach.  He expressed a desire to come off of his anticoagulation and Dr. Schilling ordered an event monitor that showed sinus rhythm with no evidence of AF.  He discontinued his Xarelto  on 06/21/24 and patient resumed ASA 81 mg.  He was advised to go to the AF clinic for further recommendation regarding discontinuation of rate control medication.  On evaluation today, patient is currently in NSR.  He is taking Toprol  50 mg daily. He wears an Apple watch. He drinks ~2 bourbons on most nights.   Today, he denies symptoms of palpitations, chest pain, shortness of breath, orthopnea, PND, lower extremity edema, dizziness, presyncope, syncope,  snoring, daytime somnolence, bleeding, or neurologic sequela. The patient is tolerating medications without difficulties and is otherwise without complaint today.   Atrial Fibrillation Management history:  Previous antiarrhythmic drugs: None Previous cardioversions: None Previous ablations: None Anticoagulation history: Eliquis  and Xarelto   ROS- All systems are reviewed and negative except as per the HPI above.  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 1 each 5   allopurinol  (ZYLOPRIM ) 300 MG tablet Take 1 tablet (300 mg total) by mouth daily. 90 tablet 1   amLODipine -benazepril  (LOTREL) 5-40 MG capsule Take 2 capsules by mouth daily. 180 capsule 1   atorvastatin  (LIPITOR) 40 MG tablet Take 1 tablet (40 mg total) by mouth daily. 90 tablet 1   Blood Glucose Monitoring Suppl (ACCU-CHEK GUIDE ME) w/Device  KIT Check BS morning , noon and bedtime Dx E11.9 1 kit 0   Blood Glucose Monitoring Suppl DEVI 1 each by Does not apply route in the morning, at noon, and at bedtime. May substitute to any manufacturer covered by patient's insurance. 1 each 0   colchicine  0.6 MG tablet TAKE 1 TABLET BY MOUTH ONCE DAILY AS NEEDED 30 tablet 0   glucose blood (ACCU-CHEK GUIDE) test strip Check BS morning , noon and bedtime Dx E11.9 300 each 3   metoprolol   succinate (TOPROL -XL) 50 MG 24 hr tablet Take 1 tablet (50 mg total) by mouth daily. Take with or immediately following a meal. 90 tablet 3   omeprazole  (PRILOSEC ) 20 MG capsule TAKE 1 CAPSULE BY MOUTH TWICE DAILY ON AN EMPTY STOMACH, THEN WAIT ONE HOUR TO EAT 180 capsule 1   Semaglutide , 2 MG/DOSE, (OZEMPIC , 2 MG/DOSE,) 8 MG/3ML SOPN Inject 2 mg into the skin once a week. 9 mL 1   No current facility-administered medications for this encounter.    Physical Exam: BP (!) 144/90   Pulse 83   Ht 5' 7 (1.702 m)   Wt 97.9 kg   BMI 33.80 kg/m   GEN: Well nourished, well developed in no acute distress NECK: No JVD; No carotid bruits CARDIAC: Regular rate and rhythm, no murmurs, rubs, gallops RESPIRATORY:  Clear to auscultation without rales, wheezing or rhonchi  ABDOMEN: Soft, non-tender, non-distended EXTREMITIES:  No edema; No deformity   Wt Readings from Last 3 Encounters:  07/11/24 97.9 kg  05/13/24 95.8 kg  04/04/24 93.9 kg     EKG today demonstrates  Vent. rate 83 BPM PR interval 146 ms QRS duration 86 ms QT/QTcB 388/455 ms P-R-T axes 40 71 41 Normal sinus rhythm Normal ECG When compared with ECG of 13-May-2024 10:14, No significant change was found  Echo 02/26/2024 demonstrated  1. Left ventricular ejection fraction, by estimation, is 55 to 60%. Left  ventricular ejection fraction by 3D volume is 55 %. The left ventricle has  normal function. Left ventricular endocardial border not optimally defined  to evaluate regional wall  motion. Left ventricular diastolic parameters are consistent with Grade I  diastolic dysfunction (impaired relaxation). The average left ventricular  global longitudinal strain is -19.4 %. The global longitudinal strain is  normal.   2. Right ventricular systolic function is normal. The right ventricular  size is normal. There is normal pulmonary artery systolic pressure.   3. The mitral valve is normal in structure. Trivial mitral valve   regurgitation. No evidence of mitral stenosis.   4. The aortic valve was not well visualized. Aortic valve regurgitation  is not visualized. No aortic stenosis is present.   5. Aortic dilatation noted. There is moderate dilatation of the ascending  aorta, measuring 44 mm.   6. The inferior vena cava is normal in size with greater than 50%  respiratory variability, suggesting right atrial pressure of 3 mmHg.    CHA2DS2-VASc Score = 3  The patient's score is based upon: CHF History: 0 HTN History: 1 Diabetes History: 1 Stroke History: 0 Vascular Disease History: 1 Age Score: 0 Gender Score: 0       ASSESSMENT AND PLAN: Paroxysmal Atrial Fibrillation (ICD10:  I48.0) The patient's CHA2DS2-VASc score is 3, indicating a 3.2% annual risk of stroke.    Patient is currently in NSR.  We discussed his recent cardiac monitor results.  We discussed ongoing rate control and that it might be reasonable to continue at this  time. Will decrease dose of Toprol  25 mg daily and advised patient to monitor his BP at home. His BP is a little elevated today but he just took his morning medications.  Secondary Hypercoagulable State (ICD10:  D68.69) The patient is at significant risk for stroke/thromboembolism based upon his CHA2DS2-VASc Score of 3.   Patient previously instructed by Dr. Lavona to stop anticoagulation and start ASA 81 mg daily.  I did advise patient if he has A-fib in the future then the recommendation would be to restart Eliquis  and stop aspirin.    Follow up Afib clinic prn.    Dorn Heinrich, [facility] [address] [place], [place] [address] [phone]

## 2024-07-12 ENCOUNTER — Ambulatory Visit (HOSPITAL_COMMUNITY): Admitting: Internal Medicine

## 2024-07-19 ENCOUNTER — Ambulatory Visit: Payer: Self-pay

## 2024-07-19 NOTE — Telephone Encounter (Signed)
 Patient would like to speak directly to you. Please call

## 2024-07-19 NOTE — Telephone Encounter (Signed)
 FYI Only or Action Required?: Action required by provider: Pt is requesting a call back within the hour regarding which hospital system he should have ongoing treatment and surgery with today..  Patient was last seen in primary care on 02/10/2024 by Joesph Annabella HERO, FNP.  Called Nurse Triage reporting Advice Only.  Symptoms began yesterday.  Interventions attempted: Other: went to ED.  Symptoms are: unchanged.  Triage Disposition: No disposition on file.  Patient/caregiver understands and will follow disposition?: yes                 Copied from CRM #[id number]. Topic: Clinical - Red Word Triage >> Jul 19, 2024  8:32 AM Miquel SAILOR wrote: Red Word that prompted transfer to Nurse Triage: compond Fractuires /RT Thumb/Orbital Rufino /Cut on head / PT stated Emergency  surgery needed/accident happened  12/01 Answer Assessment - Initial Assessment Questions Pt fell down stairs yesterday and has several injuries. Pt was seen at [facility] ED. [facility] is calling pt to schedule surgery today. Pt is unsure if he should use [facility] or use [facility] providers. Please see ED note.    1. REASON FOR CALL: What is the main reason for your call? or How can I best help you?     Pt is wondering if he should go to [facility] for sx today or go to a [facility] facility for surgery and follow up care 2. SYMPTOMS : Do you have any symptoms?      Pt fell down stairs and has many injuries. 3. OTHER QUESTIONS: Do you have any other questions?     Please see ED encounter  Protocols used: Information Only Call - No Triage-A-AH

## 2024-07-22 ENCOUNTER — Other Ambulatory Visit: Payer: Self-pay | Admitting: Nurse Practitioner

## 2024-07-22 DIAGNOSIS — M109 Gout, unspecified: Secondary | ICD-10-CM

## 2024-07-28 ENCOUNTER — Ambulatory Visit: Payer: Self-pay | Admitting: Nurse Practitioner

## 2024-07-28 ENCOUNTER — Encounter: Payer: Self-pay | Admitting: Nurse Practitioner

## 2024-07-28 VITALS — BP 125/76 | HR 69 | Temp 97.6°F | Ht 67.0 in | Wt 211.0 lb

## 2024-07-28 DIAGNOSIS — I480 Paroxysmal atrial fibrillation: Secondary | ICD-10-CM

## 2024-07-28 DIAGNOSIS — H9202 Otalgia, left ear: Secondary | ICD-10-CM | POA: Diagnosis not present

## 2024-07-28 DIAGNOSIS — I2510 Atherosclerotic heart disease of native coronary artery without angina pectoris: Secondary | ICD-10-CM | POA: Diagnosis not present

## 2024-07-28 DIAGNOSIS — M109 Gout, unspecified: Secondary | ICD-10-CM | POA: Diagnosis not present

## 2024-07-28 DIAGNOSIS — E119 Type 2 diabetes mellitus without complications: Secondary | ICD-10-CM

## 2024-07-28 DIAGNOSIS — E1169 Type 2 diabetes mellitus with other specified complication: Secondary | ICD-10-CM | POA: Diagnosis not present

## 2024-07-28 DIAGNOSIS — K219 Gastro-esophageal reflux disease without esophagitis: Secondary | ICD-10-CM

## 2024-07-28 DIAGNOSIS — Z6833 Body mass index (BMI) 33.0-33.9, adult: Secondary | ICD-10-CM

## 2024-07-28 DIAGNOSIS — I10 Essential (primary) hypertension: Secondary | ICD-10-CM | POA: Diagnosis not present

## 2024-07-28 DIAGNOSIS — K5790 Diverticulosis of intestine, part unspecified, without perforation or abscess without bleeding: Secondary | ICD-10-CM

## 2024-07-28 DIAGNOSIS — I4891 Unspecified atrial fibrillation: Secondary | ICD-10-CM

## 2024-07-28 DIAGNOSIS — Z7985 Long-term (current) use of injectable non-insulin antidiabetic drugs: Secondary | ICD-10-CM | POA: Diagnosis not present

## 2024-07-28 DIAGNOSIS — E785 Hyperlipidemia, unspecified: Secondary | ICD-10-CM | POA: Diagnosis not present

## 2024-07-28 LAB — CBC WITH DIFFERENTIAL/PLATELET
Basophils Absolute: 0.1 x10E3/uL (ref 0.0–0.2)
Basos: 1 %
EOS (ABSOLUTE): 0.4 x10E3/uL (ref 0.0–0.4)
Eos: 6 %
Hematocrit: 44.4 % (ref 37.5–51.0)
Hemoglobin: 14.4 g/dL (ref 13.0–17.7)
Immature Grans (Abs): 0 x10E3/uL (ref 0.0–0.1)
Immature Granulocytes: 0 %
Lymphocytes Absolute: 1.9 x10E3/uL (ref 0.7–3.1)
Lymphs: 30 %
MCH: 31.1 pg (ref 26.6–33.0)
MCHC: 32.4 g/dL (ref 31.5–35.7)
MCV: 96 fL (ref 79–97)
Monocytes Absolute: 0.8 x10E3/uL (ref 0.1–0.9)
Monocytes: 13 %
Neutrophils Absolute: 3.1 x10E3/uL (ref 1.4–7.0)
Neutrophils: 50 %
Platelets: 265 x10E3/uL (ref 150–450)
RBC: 4.63 x10E6/uL (ref 4.14–5.80)
RDW: 12.6 % (ref 11.6–15.4)
WBC: 6.3 x10E3/uL (ref 3.4–10.8)

## 2024-07-28 LAB — CMP14+EGFR
ALT: 36 IU/L (ref 0–44)
AST: 30 IU/L (ref 0–40)
Albumin: 4.5 g/dL (ref 3.9–4.9)
Alkaline Phosphatase: 107 IU/L (ref 47–123)
BUN/Creatinine Ratio: 29 — ABNORMAL HIGH (ref 10–24)
BUN: 26 mg/dL (ref 8–27)
Bilirubin Total: 0.6 mg/dL (ref 0.0–1.2)
CO2: 20 mmol/L (ref 20–29)
Calcium: 9.9 mg/dL (ref 8.6–10.2)
Chloride: 102 mmol/L (ref 96–106)
Creatinine, Ser: 0.89 mg/dL (ref 0.76–1.27)
Globulin, Total: 2.4 g/dL (ref 1.5–4.5)
Glucose: 99 mg/dL (ref 70–99)
Potassium: 4.8 mmol/L (ref 3.5–5.2)
Sodium: 139 mmol/L (ref 134–144)
Total Protein: 6.9 g/dL (ref 6.0–8.5)
eGFR: 96 mL/min/1.73 (ref >59)

## 2024-07-28 LAB — BAYER DCA HB A1C WAIVED: HB A1C (BAYER DCA - WAIVED): 5.7 % — ABNORMAL HIGH (ref 4.8–5.6)

## 2024-07-28 LAB — LIPID PANEL
Chol/HDL Ratio: 3.3 ratio (ref 0.0–5.0)
Cholesterol, Total: 159 mg/dL (ref 100–199)
HDL: 48 mg/dL (ref >39)
LDL Chol Calc (NIH): 81 mg/dL (ref 0–99)
Triglycerides: 178 mg/dL — ABNORMAL HIGH (ref 0–149)
VLDL Cholesterol Cal: 30 mg/dL (ref 5–40)

## 2024-07-28 MED ORDER — OMEPRAZOLE 20 MG PO CPDR: DELAYED_RELEASE_CAPSULE | ORAL | 1 refills | Status: AC

## 2024-07-28 MED ORDER — AMLODIPINE BESY-BENAZEPRIL HCL 5-40 MG PO CAPS: 2.0000 | ORAL_CAPSULE | Freq: Every day | ORAL | 1 refills | Status: AC

## 2024-07-28 MED ORDER — ATORVASTATIN CALCIUM 40 MG PO TABS: 40.0000 mg | ORAL_TABLET | Freq: Every day | ORAL | 1 refills | Status: AC

## 2024-07-28 MED ORDER — METOPROLOL SUCCINATE ER 25 MG PO TB24: 25.0000 mg | ORAL_TABLET | Freq: Every day | ORAL | 3 refills | Status: AC

## 2024-07-28 MED ORDER — CIPROFLOXACIN-DEXAMETHASONE 0.3-0.1 % OT SUSP: 4.0000 [drp] | Freq: Two times a day (BID) | OTIC | 0 refills | Status: AC

## 2024-07-28 MED ORDER — OZEMPIC (2 MG/DOSE) 8 MG/3ML ~~LOC~~ SOPN: 2.0000 mg | PEN_INJECTOR | SUBCUTANEOUS | 1 refills | Status: AC

## 2024-07-28 MED ORDER — ALLOPURINOL 300 MG PO TABS: 300.0000 mg | ORAL_TABLET | Freq: Every day | ORAL | 0 refills | Status: AC

## 2024-07-28 NOTE — Progress Notes (Signed)
 Subjective:    Patient ID: Donald Butler, male    DOB: 27-Mar-1961, 63 y.o.   MRN: [id number]   Chief Complaint: medical management of chronic issues       HPI:  Donald Butler is a 63 y.o. who identifies as a male who was assigned male at birth.   Social history: Lives with: wife Work history: retired   Water Engineer in today for follow up of the following chronic medical issues:  1. Primary hypertension No c/o chest pain, sob or headache. Does not check blood pressure at home. BP Readings from Last 3 Encounters:  07/11/24 (!) 144/90  05/13/24 (!) 132/90  04/04/24 (!) 108/52     2. Coronary artery disease involving native coronary artery of native heart without angina pectoris 3. Paroxysmal atrial fibrillation [facility]) Patient saw cardiology on 11/11/23. They plan to continue risk reduction. No changes were made. Patient went into afib for about 3 hours Sunday night. It caused vomiting. Then he had to stay in the bed for 3 days. Just feels weak today.  4. Diverticulosis No recent flare up  5. Gastroesophageal reflux disease, unspecified whether esophagitis present Takes omeprazole  daily  6. Hyperlipidemia associated with type 2 diabetes mellitus (HCC) Does not watch diet and does no dedicated exercise. Lab Results  Component Value Date   CHOL 222 (H) 01/29/2024   HDL 75 01/29/2024   LDLCALC 137 (H) 01/29/2024   TRIG 55 01/29/2024   CHOLHDL 3.0 01/29/2024     7. Diet-controlled diabetes mellitus (HCC) Doe snot check blood sugars at home. Lab Results  Component Value Date   HGBA1C 5.2 01/29/2024     8. Gout, unspecified cause, unspecified chronicity, unspecified site Denies any recent flar eups  9. BMI 33.0-33.9 (HCC) Weight is up 3 lbs  BMI Readings from Last 3 Encounters:  07/11/24 33.80 kg/m  05/13/24 33.09 kg/m  04/04/24 32.42 kg/m   Wt Readings from Last 3 Encounters:  07/11/24 215 lb 12.8 oz (97.9 kg)  05/13/24 211 lb 4 oz (95.8 kg)  04/04/24 207  lb (93.9 kg)     New complaints: -Patient had a fall and injured his right arm and had to have surgery. -he has tubes in his left ear- after he fell his left ear drained. He has been using ciprodex  drops and sayas he can  not hear out of his left ear. Allergies  Allergen Reactions   Penicillins Hives, Shortness Of Breath and Swelling   Outpatient Encounter Medications as of 07/28/2024  Medication Sig   Accu-Chek Softclix Lancets lancets Check BS morning , noon and bedtime Dx E11.9   albuterol  (VENTOLIN  HFA) 108 (90 Base) MCG/ACT inhaler Inhale 2 puffs into the lungs every 6 (six) hours as needed for wheezing or shortness of breath.   allopurinol  (ZYLOPRIM ) 300 MG tablet Take 1 tablet by mouth once daily   amLODipine -benazepril  (LOTREL) 5-40 MG capsule Take 2 capsules by mouth daily.   atorvastatin  (LIPITOR) 40 MG tablet Take 1 tablet (40 mg total) by mouth daily.   Blood Glucose Monitoring Suppl (ACCU-CHEK GUIDE ME) w/Device KIT Check BS morning , noon and bedtime Dx E11.9   Blood Glucose Monitoring Suppl DEVI 1 each by Does not apply route in the morning, at noon, and at bedtime. May substitute to any manufacturer covered by patient's insurance.   colchicine  0.6 MG tablet TAKE 1 TABLET BY MOUTH ONCE DAILY AS NEEDED   glucose blood (ACCU-CHEK GUIDE) test strip Check BS morning , noon and  bedtime Dx E11.9   metoprolol  succinate (TOPROL -XL) 25 MG 24 hr tablet Take 1 tablet (25 mg total) by mouth daily. Take with or immediately following a meal.   omeprazole  (PRILOSEC ) 20 MG capsule TAKE 1 CAPSULE BY MOUTH TWICE DAILY ON AN EMPTY STOMACH, THEN WAIT ONE HOUR TO EAT   Semaglutide , 2 MG/DOSE, (OZEMPIC , 2 MG/DOSE,) 8 MG/3ML SOPN Inject 2 mg into the skin once a week.   [DISCONTINUED] allopurinol  (ZYLOPRIM ) 300 MG tablet Take 1 tablet (300 mg total) by mouth daily.   [DISCONTINUED] apixaban  (ELIQUIS ) 5 MG TABS tablet Take 1 tablet (5 mg total) by mouth 2 (two) times daily.   [DISCONTINUED]  doxycycline  (VIBRAMYCIN ) 100 MG capsule Take 1 capsule (100 mg total) by mouth 2 (two) times daily. One po bid x 7 days (Patient not taking: Reported on 05/13/2024)   [DISCONTINUED] metoprolol  tartrate (LOPRESSOR ) 50 MG tablet Take 1 tablet (50 mg total) by mouth 2 (two) times daily as needed for palpitations.   [DISCONTINUED] Multiple Vitamin (MULTIVITAMIN) tablet Take 1 tablet by mouth daily.  (Patient not taking: Reported on 05/13/2024)   No facility-administered encounter medications on file as of 07/28/2024.    Past Surgical History:  Procedure Laterality Date   CARPAL TUNNEL RELEASE Left    COLONOSCOPY     EXTERNAL EAR SURGERY Bilateral    KNEE ARTHROSCOPY Left 2006, 2008   x 2  left   LUMBAR DISC SURGERY  1982, 1983   ruptured disk  x 2   MASTOIDECTOMY Bilateral 2013   right   OPEN ANTERIOR SHOULDER RECONSTRUCTION Right 1970   right   POLYPECTOMY     reconstruction left leg Left 2009   multiple surgery after accident    Family History  Problem Relation Age of Onset   Hemophilia Mother    Diabetes Father    Heart disease Father    Colon cancer Neg Hx    Stomach cancer Neg Hx    Esophageal cancer Neg Hx    Rectal cancer Neg Hx    Pancreatic cancer Neg Hx       Controlled substance contract: n/a     Review of Systems  Constitutional:  Negative for diaphoresis.  Eyes:  Negative for pain.  Respiratory:  Negative for shortness of breath.   Cardiovascular:  Negative for chest pain, palpitations and leg swelling.  Gastrointestinal:  Negative for abdominal pain.  Endocrine: Negative for polydipsia.  Skin:  Negative for rash.  Neurological:  Negative for dizziness, weakness and headaches.  Hematological:  Does not bruise/bleed easily.  All other systems reviewed and are negative.      Objective:   Physical Exam Vitals and nursing note reviewed.  Constitutional:      Appearance: Normal appearance. He is well-developed.  HENT:     Head: Normocephalic.      Right Ear: A PE tube is present.     Left Ear: A PE tube (tube clear from what i can see.) is present.     Nose: Nose normal.     Mouth/Throat:     Mouth: Mucous membranes are moist.     Pharynx: Oropharynx is clear.  Eyes:     Pupils: Pupils are equal, round, and reactive to light.  Neck:     Thyroid : No thyroid  mass or thyromegaly.     Vascular: No carotid bruit or JVD.     Trachea: Phonation normal.  Cardiovascular:     Rate and Rhythm: Normal rate and regular rhythm.  Pulmonary:     Effort: Pulmonary effort is normal. No respiratory distress.     Breath sounds: Normal breath sounds.  Abdominal:     General: Bowel sounds are normal.     Palpations: Abdomen is soft.     Tenderness: There is no abdominal tenderness.  Musculoskeletal:        General: Normal range of motion.     Cervical back: Normal range of motion and neck supple.  Lymphadenopathy:     Cervical: No cervical adenopathy.  Skin:    General: Skin is warm and dry.     Comments: Wound to left temple well approximated-suture removal x3  Neurological:     Mental Status: He is alert and oriented to person, place, and time.  Psychiatric:        Behavior: Behavior normal.        Thought Content: Thought content normal.        Judgment: Judgment normal.     BP 125/76   Pulse 69   Temp 97.6 F (36.4 C) (Temporal)   Ht 5' 7 (1.702 m)   Wt 211 lb (95.7 kg)   SpO2 96%   BMI 33.05 kg/m   Stitch removal from left temple.    Hgba1c discussed at appointment-5.7%      Assessment & Plan:   LATRON RIBAS comes in today with chief complaint of medical management of chronic issues    Diagnosis and orders addressed:  1. Primary hypertension (Primary) Low sodium diet - CBC with Differential/Platelet - CMP14+EGFR - amLODipine -benazepril  (LOTREL) 5-40 MG capsule; Take 2 capsules by mouth daily.  Dispense: 180 capsule; Refill: 1  2. Coronary artery disease involving native coronary artery of native heart  without angina pectoris Keep follow up with cardiology Keep diary of any new atrial fib episodes  3. Paroxysmal atrial fibrillation (HCC) Avoid caffeine  4. Diverticulosis Watch diet to prevent flare up  5. Gastroesophageal reflux disease, unspecified whether esophagitis present Avoid spicy foods Do not eat 2 hours prior to bedtime - omeprazole  (PRILOSEC ) 20 MG capsule; TAKE 1 CAPSULE BY MOUTH TWICE DAILY ON AN EMPTY STOMACH, THEN WAIT ONE HOUR TO EAT  Dispense: 180 capsule; Refill: 1  6. Hyperlipidemia associated with type 2 diabetes mellitus (HCC) Low fat diet - Lipid panel - atorvastatin  (LIPITOR) 40 MG tablet; Take 1 tablet (40 mg total) by mouth daily.  Dispense: 90 tablet; Refill: 3  7. Gout, unspecified cause, unspecified chronicity, unspecified site - allopurinol  (ZYLOPRIM ) 300 MG tablet; Take 1 tablet (300 mg total) by mouth daily.  Dispense: 90 tablet; Refill: 1  8. Morbid obesity (HCC) Discussed diet and exercise for person with BMI >25 Will recheck weight in 3-6 months   9. Diabetes mellitus type 2 with other complications Low carb diet - Bayer DCA Hb A1c Waived - Microalbumin / creatinine urine ratio - Semaglutide , 2 MG/DOSE, (OZEMPIC , 2 MG/DOSE,) 8 MG/3ML SOPN; Inject 2 mg into the skin once a week.  Dispense: 9 mL; Refill: 1  10. Otalgia left Ciprodex  drops bid Avoid getting water in ears Referral to ENT  Labs pending Health Maintenance reviewed Diet and exercise encouraged  Follow up plan: 6 months   [name] Gladis, FNP

## 2024-07-29 ENCOUNTER — Ambulatory Visit: Payer: Self-pay | Admitting: Nurse Practitioner

## 2024-08-23 DIAGNOSIS — M79646 Pain in unspecified finger(s): Secondary | ICD-10-CM

## 2024-08-23 NOTE — Telephone Encounter (Signed)
 Okay to add/send referral?

## 2024-08-23 NOTE — Telephone Encounter (Signed)
 Copied from CRM [id number]. Topic: Referral - Prior Authorization Question >> Aug 23, 2024 11:38 AM Diannia H wrote: Reason for CRM: Starting January the insurance [employer] is requiring a insurance referral if they don't have it the insurance wont pay for the patients visits. Hillary from [facility] called and wanted to pass this information on. She stated it needs to go to who ever handles insurance in office. Could you assist?   Hillary's callback number is [phone] if you have any questions or concerns.

## 2024-08-25 ENCOUNTER — Telehealth: Payer: Self-pay | Admitting: Nurse Practitioner

## 2024-08-25 DIAGNOSIS — M25512 Pain in left shoulder: Secondary | ICD-10-CM

## 2024-08-25 NOTE — Telephone Encounter (Signed)
 [facility] Referral Needed:  Start date 08/25/2024  Dx:  Chronic left shoulder pain  Manus Dasie Gobble, DO (Attending) NPI: [id number] [phone] (Work) [phone] (Fax) Orthopedic Surgery [place], [place]

## 2024-08-31 ENCOUNTER — Telehealth: Payer: Self-pay

## 2024-08-31 ENCOUNTER — Telehealth: Payer: Self-pay | Admitting: Pharmacist

## 2024-08-31 DIAGNOSIS — E1169 Type 2 diabetes mellitus with other specified complication: Secondary | ICD-10-CM

## 2024-08-31 NOTE — Telephone Encounter (Signed)
 Copied from CRM [id number]. Topic: Clinical - Prescription Issue >> Aug 31, 2024 10:41 AM Marda G wrote: Reason for CRM: Semaglutide , 2 MG/DOSE, (OZEMPIC , 2 MG/DOSE,) 8 MG/3ML SOPN.   Patient stated he was paying $140.00 not the cost is $837.00 ... Is there any discount programs. Codes, or coupons. SABRAor can you look into medication that works the same but cheaper in price.  Please advise.

## 2024-08-31 NOTE — Telephone Encounter (Signed)
 Placed [id number] to pharmacy for medication assistance and management.  Ozempic  cost has increased.  Patient likely has deductible.  Will need to devise plan.  Follow up schedule with PharmD.  Tayt Moyers Dattero Boy Delamater, PharmD, BCACP, CPP Clinical Pharmacist, [facility]

## 2024-08-31 NOTE — Telephone Encounter (Signed)
 Referral to pharmacy placed for med assist

## 2024-09-01 ENCOUNTER — Telehealth: Payer: Self-pay

## 2024-09-01 NOTE — Progress Notes (Signed)
 Care Guide Pharmacy Note  09/01/2024 Name: Donald Butler MRN: [id number] DOB: [birth year]  Referred By: Gladis Mustard, FNP Reason for referral: Complex Care Management (Outreach to schedule with Pharm d )   Donald Butler is a 64 y.o. year old male who is a primary care patient of Gladis Mustard, FNP.  Donald Butler was referred to the pharmacist for assistance related to: DMII  An unsuccessful telephone outreach was attempted today to contact the patient who was referred to the pharmacy team for assistance with medication assistance. Additional attempts will be made to contact the patient.  Jeoffrey Buffalo , RMA     [facility]  [facility], [facility] Guide  Direct Dial: [phone]  Website: [email or web address]

## 2024-09-02 ENCOUNTER — Encounter: Payer: Self-pay | Admitting: Pharmacist

## 2024-09-27 ENCOUNTER — Ambulatory Visit

## 2025-01-24 ENCOUNTER — Ambulatory Visit: Admitting: Nurse Practitioner
